# Patient Record
Sex: Female | Born: 1990 | Race: Black or African American | Hispanic: No | Marital: Single | Smoking: Former smoker
Health system: Southern US, Community
[De-identification: ages and names within clinical notes are randomized; demographics above are authoritative.]

## PROBLEM LIST (undated history)

## (undated) ENCOUNTER — Inpatient Hospital Stay (HOSPITAL_COMMUNITY): Payer: Self-pay

## (undated) ENCOUNTER — Emergency Department (HOSPITAL_COMMUNITY): Payer: Medicaid Other

## (undated) DIAGNOSIS — R519 Headache, unspecified: Secondary | ICD-10-CM

## (undated) DIAGNOSIS — R51 Headache: Secondary | ICD-10-CM

## (undated) DIAGNOSIS — D573 Sickle-cell trait: Secondary | ICD-10-CM

## (undated) DIAGNOSIS — O139 Gestational [pregnancy-induced] hypertension without significant proteinuria, unspecified trimester: Secondary | ICD-10-CM

## (undated) DIAGNOSIS — O159 Eclampsia, unspecified as to time period: Secondary | ICD-10-CM

## (undated) DIAGNOSIS — K219 Gastro-esophageal reflux disease without esophagitis: Secondary | ICD-10-CM

## (undated) DIAGNOSIS — I10 Essential (primary) hypertension: Secondary | ICD-10-CM

## (undated) DIAGNOSIS — D649 Anemia, unspecified: Secondary | ICD-10-CM

## (undated) HISTORY — DX: Essential (primary) hypertension: I10

## (undated) HISTORY — DX: Gestational (pregnancy-induced) hypertension without significant proteinuria, unspecified trimester: O13.9

## (undated) HISTORY — PX: CHOLECYSTECTOMY: SHX55

## (undated) HISTORY — DX: Gastro-esophageal reflux disease without esophagitis: K21.9

## (undated) HISTORY — DX: Anemia, unspecified: D64.9

## (undated) HISTORY — PX: APPENDECTOMY: SHX54

---
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
## 2008-02-21 ENCOUNTER — Emergency Department (HOSPITAL_COMMUNITY): Admission: EM | Admit: 2008-02-21 | Discharge: 2008-02-21 | Payer: Self-pay | Admitting: Emergency Medicine

## 2008-04-09 ENCOUNTER — Emergency Department (HOSPITAL_COMMUNITY): Admission: EM | Admit: 2008-04-09 | Discharge: 2008-04-09 | Payer: Self-pay | Admitting: Family Medicine

## 2008-04-28 HISTORY — PX: CHOLECYSTECTOMY: SHX55

## 2009-06-20 ENCOUNTER — Emergency Department (HOSPITAL_COMMUNITY): Admission: EM | Admit: 2009-06-20 | Discharge: 2009-06-20 | Payer: Self-pay | Admitting: Emergency Medicine

## 2009-06-27 ENCOUNTER — Inpatient Hospital Stay (HOSPITAL_COMMUNITY): Admission: EM | Admit: 2009-06-27 | Discharge: 2009-06-29 | Payer: Self-pay | Admitting: Emergency Medicine

## 2009-06-28 ENCOUNTER — Encounter (INDEPENDENT_AMBULATORY_CARE_PROVIDER_SITE_OTHER): Payer: Self-pay | Admitting: General Surgery

## 2009-11-17 ENCOUNTER — Ambulatory Visit: Payer: Self-pay | Admitting: Nurse Practitioner

## 2009-11-17 ENCOUNTER — Inpatient Hospital Stay (HOSPITAL_COMMUNITY): Admission: AD | Admit: 2009-11-17 | Discharge: 2009-11-17 | Payer: Self-pay | Admitting: Obstetrics & Gynecology

## 2010-02-20 ENCOUNTER — Emergency Department (HOSPITAL_COMMUNITY): Admission: EM | Admit: 2010-02-20 | Discharge: 2010-02-20 | Payer: Self-pay | Admitting: Emergency Medicine

## 2010-04-24 ENCOUNTER — Inpatient Hospital Stay (HOSPITAL_COMMUNITY)
Admission: AD | Admit: 2010-04-24 | Discharge: 2010-04-24 | Payer: Self-pay | Source: Home / Self Care | Attending: Obstetrics & Gynecology | Admitting: Obstetrics & Gynecology

## 2010-07-08 LAB — URINE CULTURE

## 2010-07-08 LAB — URINALYSIS, ROUTINE W REFLEX MICROSCOPIC
Bilirubin Urine: NEGATIVE
Glucose, UA: NEGATIVE mg/dL
Protein, ur: NEGATIVE mg/dL
Specific Gravity, Urine: 1.02 (ref 1.005–1.030)
pH: 6.5 (ref 5.0–8.0)

## 2010-07-08 LAB — URINE MICROSCOPIC-ADD ON

## 2010-07-08 LAB — CBC
HCT: 37.7 % (ref 36.0–46.0)
Platelets: 187 10*3/uL (ref 150–400)

## 2010-07-10 LAB — DIFFERENTIAL
Basophils Relative: 0 % (ref 0–1)
Eosinophils Absolute: 0 10*3/uL (ref 0.0–0.7)
Eosinophils Relative: 0 % (ref 0–5)
Lymphs Abs: 0.9 10*3/uL (ref 0.7–4.0)
Monocytes Absolute: 0.5 10*3/uL (ref 0.1–1.0)
Neutro Abs: 12 10*3/uL — ABNORMAL HIGH (ref 1.7–7.7)

## 2010-07-10 LAB — COMPREHENSIVE METABOLIC PANEL
Albumin: 4.2 g/dL (ref 3.5–5.2)
BUN: 8 mg/dL (ref 6–23)
Calcium: 9.8 mg/dL (ref 8.4–10.5)
GFR calc Af Amer: >60 mL/min (ref >60)
Glucose, Bld: 117 mg/dL — ABNORMAL HIGH (ref 70–99)
Sodium: 136 mEq/L (ref 135–145)

## 2010-07-10 LAB — URINALYSIS, ROUTINE W REFLEX MICROSCOPIC
Bilirubin Urine: NEGATIVE
Glucose, UA: NEGATIVE mg/dL
Hgb urine dipstick: NEGATIVE
Nitrite: NEGATIVE
Urobilinogen, UA: 0.2 mg/dL (ref 0.0–1.0)

## 2010-07-10 LAB — CBC
Hemoglobin: 13.7 g/dL (ref 12.0–15.0)
MCHC: 34 g/dL (ref 30.0–36.0)
Platelets: 195 10*3/uL (ref 150–400)
RBC: 5.17 MIL/uL — ABNORMAL HIGH (ref 3.87–5.11)
RDW: 12.4 % (ref 11.5–15.5)
WBC: 13.4 10*3/uL — ABNORMAL HIGH (ref 4.0–10.5)

## 2010-07-10 LAB — PREGNANCY, URINE: Preg Test, Ur: NEGATIVE

## 2010-07-10 LAB — LIPASE, BLOOD: Lipase: 23 U/L (ref 11–59)

## 2010-07-13 LAB — GC/CHLAMYDIA PROBE AMP, GENITAL: GC Probe Amp, Genital: NEGATIVE

## 2010-07-13 LAB — COMPREHENSIVE METABOLIC PANEL
BUN: 4 mg/dL — ABNORMAL LOW (ref 6–23)
CO2: 24 mEq/L (ref 19–32)
Calcium: 9.6 mg/dL (ref 8.4–10.5)
Chloride: 107 mEq/L (ref 96–112)
Creatinine, Ser: 0.61 mg/dL (ref 0.4–1.2)
GFR calc non Af Amer: >60 mL/min (ref >60)
Glucose, Bld: 95 mg/dL (ref 70–99)
Total Bilirubin: 0.2 mg/dL — ABNORMAL LOW (ref 0.3–1.2)

## 2010-07-13 LAB — WET PREP, GENITAL
Trich, Wet Prep: NONE SEEN
Yeast Wet Prep HPF POC: NONE SEEN

## 2010-07-13 LAB — CBC
HCT: 39 % (ref 36.0–46.0)
Hemoglobin: 13 g/dL (ref 12.0–15.0)
MCH: 26.5 pg (ref 26.0–34.0)
MCHC: 33.4 g/dL (ref 30.0–36.0)
MCV: 79.3 fL (ref 78.0–100.0)
RBC: 4.91 MIL/uL (ref 3.87–5.11)

## 2010-07-13 LAB — DIFFERENTIAL
Basophils Absolute: 0 10*3/uL (ref 0.0–0.1)
Eosinophils Absolute: 0.2 10*3/uL (ref 0.0–0.7)
Eosinophils Relative: 4 % (ref 0–5)
Lymphocytes Relative: 38 % (ref 12–46)
Lymphs Abs: 2.4 10*3/uL (ref 0.7–4.0)
Neutrophils Relative %: 52 % (ref 43–77)

## 2010-07-13 LAB — URINE MICROSCOPIC-ADD ON

## 2010-07-13 LAB — URINALYSIS, ROUTINE W REFLEX MICROSCOPIC
Glucose, UA: NEGATIVE mg/dL
Leukocytes, UA: NEGATIVE
Specific Gravity, Urine: 1.015 (ref 1.005–1.030)
Urobilinogen, UA: 1 mg/dL (ref 0.0–1.0)

## 2010-07-17 LAB — COMPREHENSIVE METABOLIC PANEL
CO2: 24 mEq/L (ref 19–32)
Calcium: 9.8 mg/dL (ref 8.4–10.5)
Creatinine, Ser: 0.69 mg/dL (ref 0.4–1.2)
GFR calc non Af Amer: >60 mL/min (ref >60)
Glucose, Bld: 119 mg/dL — ABNORMAL HIGH (ref 70–99)
Sodium: 141 mEq/L (ref 135–145)
Total Protein: 8.1 g/dL (ref 6.0–8.3)

## 2010-07-17 LAB — CBC
Hemoglobin: 13.7 g/dL (ref 12.0–15.0)
MCHC: 32.8 g/dL (ref 30.0–36.0)
MCV: 79.2 fL (ref 78.0–100.0)
RBC: 5.29 MIL/uL — ABNORMAL HIGH (ref 3.87–5.11)
RDW: 12.5 % (ref 11.5–15.5)

## 2010-07-17 LAB — DIFFERENTIAL
Lymphocytes Relative: 9 % — ABNORMAL LOW (ref 12–46)
Lymphs Abs: 1 10*3/uL (ref 0.7–4.0)
Monocytes Relative: 1 % — ABNORMAL LOW (ref 3–12)
Neutro Abs: 10.2 10*3/uL — ABNORMAL HIGH (ref 1.7–7.7)
Neutrophils Relative %: 90 % — ABNORMAL HIGH (ref 43–77)

## 2010-07-17 LAB — LIPASE, BLOOD: Lipase: 23 U/L (ref 11–59)

## 2010-07-21 LAB — URINE MICROSCOPIC-ADD ON

## 2010-07-21 LAB — URINALYSIS, ROUTINE W REFLEX MICROSCOPIC
Glucose, UA: NEGATIVE mg/dL
Ketones, ur: NEGATIVE mg/dL
Leukocytes, UA: NEGATIVE
Nitrite: POSITIVE — AB
Specific Gravity, Urine: 1.013 (ref 1.005–1.030)
pH: 7 (ref 5.0–8.0)

## 2010-07-21 LAB — CBC
Hemoglobin: 13.5 g/dL (ref 12.0–15.0)
MCHC: 34.3 g/dL (ref 30.0–36.0)
Platelets: 191 10*3/uL (ref 150–400)
RDW: 12.7 % (ref 11.5–15.5)

## 2010-07-21 LAB — DIFFERENTIAL
Lymphs Abs: 2.7 10*3/uL (ref 0.7–4.0)
Monocytes Absolute: 0.3 10*3/uL (ref 0.1–1.0)
Monocytes Relative: 6 % (ref 3–12)
Neutro Abs: 3 10*3/uL (ref 1.7–7.7)
Neutrophils Relative %: 48 % (ref 43–77)

## 2010-07-21 LAB — COMPREHENSIVE METABOLIC PANEL
ALT: 16 U/L (ref 0–35)
Albumin: 4.2 g/dL (ref 3.5–5.2)
BUN: 8 mg/dL (ref 6–23)
Calcium: 9.5 mg/dL (ref 8.4–10.5)
Glucose, Bld: 87 mg/dL (ref 70–99)
Sodium: 136 mEq/L (ref 135–145)
Total Protein: 7.7 g/dL (ref 6.0–8.3)

## 2010-07-21 LAB — GLUCOSE, CAPILLARY: Glucose-Capillary: 140 mg/dL — ABNORMAL HIGH (ref 70–99)

## 2010-07-23 ENCOUNTER — Emergency Department (HOSPITAL_COMMUNITY)
Admission: EM | Admit: 2010-07-23 | Discharge: 2010-07-23 | Disposition: A | Discharge status: Home / Self Care | Payer: Self-pay | Attending: Emergency Medicine | Admitting: Emergency Medicine

## 2010-07-23 DIAGNOSIS — J351 Hypertrophy of tonsils: Secondary | ICD-10-CM | POA: Insufficient documentation

## 2010-07-23 DIAGNOSIS — J029 Acute pharyngitis, unspecified: Secondary | ICD-10-CM | POA: Insufficient documentation

## 2010-07-23 LAB — RAPID STREP SCREEN (MED CTR MEBANE ONLY): Streptococcus, Group A Screen (Direct): NEGATIVE

## 2010-08-06 ENCOUNTER — Emergency Department (HOSPITAL_COMMUNITY)
Admission: EM | Admit: 2010-08-06 | Discharge: 2010-08-07 | Discharge status: Against Medical Advice | Payer: Self-pay | Attending: Emergency Medicine | Admitting: Emergency Medicine

## 2010-08-06 DIAGNOSIS — R5381 Other malaise: Secondary | ICD-10-CM | POA: Insufficient documentation

## 2010-08-06 DIAGNOSIS — J029 Acute pharyngitis, unspecified: Secondary | ICD-10-CM | POA: Insufficient documentation

## 2010-08-25 ENCOUNTER — Inpatient Hospital Stay (HOSPITAL_COMMUNITY)
Admission: AD | Admit: 2010-08-25 | Discharge: 2010-08-25 | Disposition: A | Discharge status: Home / Self Care | Payer: Medicaid Other | Source: Ambulatory Visit | Attending: Obstetrics and Gynecology | Admitting: Obstetrics and Gynecology

## 2010-08-25 ENCOUNTER — Inpatient Hospital Stay (HOSPITAL_COMMUNITY): Payer: Medicaid Other

## 2010-08-25 DIAGNOSIS — O99891 Other specified diseases and conditions complicating pregnancy: Secondary | ICD-10-CM | POA: Insufficient documentation

## 2010-08-25 DIAGNOSIS — O9989 Other specified diseases and conditions complicating pregnancy, childbirth and the puerperium: Secondary | ICD-10-CM

## 2010-08-25 DIAGNOSIS — R109 Unspecified abdominal pain: Secondary | ICD-10-CM

## 2010-08-25 LAB — URINALYSIS, ROUTINE W REFLEX MICROSCOPIC
Ketones, ur: NEGATIVE mg/dL
Nitrite: NEGATIVE
Protein, ur: NEGATIVE mg/dL
Urobilinogen, UA: 1 mg/dL (ref 0.0–1.0)

## 2010-08-25 LAB — CBC
HCT: 38.6 % (ref 36.0–46.0)
Hemoglobin: 13 g/dL (ref 12.0–15.0)
MCH: 25.8 pg — ABNORMAL LOW (ref 26.0–34.0)
MCV: 76.6 fL — ABNORMAL LOW (ref 78.0–100.0)
Platelets: 183 10*3/uL (ref 150–400)
RDW: 12.9 % (ref 11.5–15.5)

## 2010-08-25 LAB — HCG, QUANTITATIVE, PREGNANCY: hCG, Beta Chain, Quant, S: 200 m[IU]/mL — ABNORMAL HIGH (ref <5)

## 2010-08-25 LAB — POCT PREGNANCY, URINE: Preg Test, Ur: POSITIVE

## 2010-08-26 LAB — GC/CHLAMYDIA PROBE AMP, GENITAL
Chlamydia, DNA Probe: NEGATIVE
GC Probe Amp, Genital: NEGATIVE

## 2010-09-02 ENCOUNTER — Inpatient Hospital Stay (HOSPITAL_COMMUNITY): Payer: Medicaid Other

## 2010-09-02 ENCOUNTER — Inpatient Hospital Stay (HOSPITAL_COMMUNITY)
Admission: AD | Admit: 2010-09-02 | Discharge: 2010-09-02 | Disposition: A | Discharge status: Home / Self Care | Payer: Medicaid Other | Source: Ambulatory Visit | Attending: Obstetrics & Gynecology | Admitting: Obstetrics & Gynecology

## 2010-09-02 DIAGNOSIS — O99891 Other specified diseases and conditions complicating pregnancy: Secondary | ICD-10-CM | POA: Insufficient documentation

## 2010-09-02 DIAGNOSIS — R109 Unspecified abdominal pain: Secondary | ICD-10-CM | POA: Insufficient documentation

## 2010-09-02 LAB — HCG, QUANTITATIVE, PREGNANCY: hCG, Beta Chain, Quant, S: 5128 m[IU]/mL — ABNORMAL HIGH (ref <5)

## 2010-09-07 ENCOUNTER — Inpatient Hospital Stay (HOSPITAL_COMMUNITY)
Admission: AD | Admit: 2010-09-07 | Discharge: 2010-09-07 | Disposition: A | Discharge status: Home / Self Care | Payer: Medicaid Other | Source: Ambulatory Visit | Attending: Obstetrics & Gynecology | Admitting: Obstetrics & Gynecology

## 2010-09-07 ENCOUNTER — Inpatient Hospital Stay (HOSPITAL_COMMUNITY): Payer: Medicaid Other

## 2010-09-07 DIAGNOSIS — O209 Hemorrhage in early pregnancy, unspecified: Secondary | ICD-10-CM

## 2010-09-07 LAB — CBC
Hemoglobin: 12.8 g/dL (ref 12.0–15.0)
MCHC: 33.9 g/dL (ref 30.0–36.0)
Platelets: 172 10*3/uL (ref 150–400)
RDW: 13.3 % (ref 11.5–15.5)

## 2010-09-07 LAB — GLUCOSE, CAPILLARY: Glucose-Capillary: 101 mg/dL — ABNORMAL HIGH (ref 70–99)

## 2010-09-10 NOTE — Discharge Summary (Signed)
NAMECHEROLYN, BEHRLE               ACCOUNT NO.:  [id number]   MEDICAL RECORD NO.:  [id number]          PATIENT TYPE:  INP   LOCATION:  9156                          FACILITY:  [facility]   PHYSICIAN:  Phil D. Okey Dupre, M.D.     DATE OF BIRTH:  09-04-1990   DATE OF ADMISSION:  06/10/2007  DATE OF DISCHARGE:  06/12/2007                               DISCHARGE SUMMARY   ADMISSION DIAGNOSES:  1. Intrauterine pregnancy at 32 weeks, 5 days.  2. Pre-term contractions with positive fetal fibronectin.  3. Elevated blood pressures.   DISCHARGE DIAGNOSES:  1. Intrauterine pregnancy at 33 weeks, 0 days, gestation.  2. Threatened pre-term labor.  3. Pregnancy-induced hypertension.   PROCEDURES:  The patient had a ultrasound performed on the day of  admission, showing a single gestation, cephalic presentation.  Placenta  was posterior fundal above cervical os.  Amniotic fluid index was 15.1.  Estimated fetal weight was 1788 grams in the 35th percentile.  Cervical  length was 3.5 cm measured trans-vaginally.   CONSULTATIONS:  None.   COMPLICATIONS:  None.   PERTINENT LABORATORY FINDINGS:  Ms. Bernard had admission labs with a CBC  showing white blood cell count of 6.9, hemoglobin 11.5, hematocrit 33.6,  platelets 129.  Urinalysis was negative.  Complete metabolic panel:  Sodium 136, potassium 3.8, BUN 5, creatinine 0.57, AST 14, ALT 10.  Remainder of values within normal limits.  LDH was 107.  Uric acid was  7.3, fetal fibronectin was positive.  She had a 24-hour urine with total  volume of 1200, creatinine clearance of 118, 24-hour urine creatinine of  970, a 24-hour urine protein of 132.  Group B strep was negative.  She  had follow-up labs on hospital day #3 with CBC:  White blood cell count  was 10.5, hemoglobin 10.4, hematocrit 29.8, platelets 124.  Complete  metabolic panel:  Sodium 133, potassium 3.5, AST 16, ALT 10.  Remainder  of labs were within the normal range, including creatinine  0.67.   BRIEF PERTINENT ADMISSION HISTORY:  Ms. Hatton is an 20 year old gravida  1, para 0, presenting at 32 weeks, 5 days gestation, complaining of  cramping abdominal pain.  She was evaluated in the MAU and found to be  contracting every 5 to 10 minutes.  She was evaluated sterile speculum  exam, and fetal fibronectin was collected.  Her cervix was closed, 50%  effaced, -2 station.  Fetal fibronectin returned positive.  She also had  elevated blood pressures.  She was admitted for observation for possible  threatened pre-term labor and to rule out pregnancy-induced  hypertension.   HOSPITAL COURSE:  The patient was admitted and started on betamethasone.  She had labs drawn for pregnancy-induced hypertension, which showed low  platelets but were otherwise within normal range.  A 24-hour urine  collection was begun.  She was monitored with blood pressures, and they  got as high as about 150s/100s, at time of discharge here in the 130/80  range, and her blood pressures varied from 110 to 150 systolic/60s to  161W diastolic.  She was started on  Procardia as well, because she  continued to contract.  Dosage of Procardia was 10 mg every 6 hours.  On  hospital day #2, her cervix was re-evaluated and found to be closed -2  station.  Effacement could not be assessed.  Ultrasound was performed  showing a cervical length of 3.5 cm trans-vaginally.  The patient did  complete her course of steroids and her 24-hour urine showing a 24-hour  urine protein of 132.  Labs were repeated, and her platelets were  stable.  She denied any symptoms of pre-eclampsia at the time of  discharge, including headache, vision changes, right upper quadrant  epigastric pain.  She has denied contractions at the time of discharge.  The patient was in stable condition for discharge.   DISCHARGE STATUS:  Stable.   DISCHARGE MEDICATIONS:  1. Prenatal vitamins, one table daily.  2. Procardia 10 mg every 6 hours.    DISCHARGE INSTRUCTIONS:  1. Discharge to home.  2. Regular diet.  3. Activity is to be limited to getting up to eat, shower, and use the      bathroom.  Otherwise, she needs to be on bed rest.  4. The patient is to have home-bound teacher arranged for her      schooling.  A prescription has been provided to assist the patient      with arranging the home-bound teacher.  5. The patient is to follow up in high-risk clinic next week.  The      clinic has been called and a message left to set her up with an      appointment.  The patient has been provided with a number to call      on Monday morning, to confirm when that appointment is.      Karlton Lemon, MD  Electronically Signed     ______________________________  Javier Glazier. Okey Dupre, M.D.    NS/MEDQ  D:  06/12/2007  T:  06/14/2007  Job:  [id number]

## 2010-09-10 NOTE — Op Note (Signed)
Renee Suarez, Renee Suarez               ACCOUNT NO.:  [id number]   MEDICAL RECORD NO.:  [id number]          PATIENT TYPE:  INP   LOCATION:  9372                          [facility]ACILITY:  [facility]   PHYSICIAN:  Lazaro Arms, M.D.   DATE OF BIRTH:  July 07, 1990   DATE OF PROCEDURE:  06/17/2007  DATE OF DISCHARGE:                               OPERATIVE REPORT   PREOP DIAGNOSES:  1. Intrauterine pregnancy at [redacted] weeks gestation.  2. Eclampsia.   POSTOP DIAGNOSES:  1. Intrauterine pregnancy at [redacted] weeks gestation.  2. Eclampsia.   PROCEDURE:  Primary low transverse cesarean section.   SURGEON:  Duane Lope, MD.   ANESTHESIA:  Spinal.   FINDINGS:  I was called to the patient's room emergently at  approximately 0355 hours because patient had experienced an eclamptic  seizure.  She had been in the hospital for evaluation of preeclampsia  and had a normal 24-hour urine completed on the 13th and another was in  progress.  All of her labs were otherwise normal.  Her blood pressures  were running in the 135 to 150s/90 to 100 range.  She woke up about an  hour prior to this seizure, was complaining she just did not feel good  and then had a seizure.  There were no problems with that, it lasted  about 45 seconds.  A fetal heart rate was obtained just after and was in  the 140s and reassuring.  The patient had a pretty short postictal  stage, I could talk to her without difficulty within about 10 minutes of  completing the seizure.  Of course, I started her on magnesium sulfate  right away, put a Foley in and made preparations to proceed with a  cesarean section.   At the time of cesarean section we delivered a viable female infant at  0601 hours, with Apgars of 8 and 9, with the weight to be determined in  the Nursery.  There was a three-vessel cord.  Cord blood and cord gas  was sent.  The cord gas had a pH of 7.26.  The placenta interestingly  did have some calcifications already.  Uterus, tubes and  ovaries were  otherwise normal.   DESCRIPTION OF OPERATION:  The patient was taken to the operating room,  underwent a spinal anesthetic.  I redrew her labs right after her  seizure and checked her platelet count which still was 127, so Dr.  Tacy Dura and I both decided it was appropriate to go forward with the  spinal at that point, she was cooperative and conversive without any  problems.  She was prepped and draped in the usual sterile fashion.  A  Pfannenstiel skin incision was made, carried down sharply to the rectus  fascia, scored in the midline, extended laterally.  The fascia was taken  off of the  muscles superiorly and inferiorly without difficulty.  The  muscles were divided, peritoneal cavity was entered.  A bladder blade  was placed.  A vesicouterine serosal flap was created.  A low transverse  hysterotomy incision was made.  Over this incision  was delivered a  viable female infant at 51 with Apgars of 8 and 9, weight to be  determined in the Nursery, three-vessel cord, cord blood and cord gas  were sent.  The infant was handed to the neonatologist who was in  attendance for routine neonatal resuscitation.  Placenta was delivered  spontaneously and did have calcifications, was sent to Pathology for  evaluation.  The uterus was wiped clean, an Alexis self-retaining wound  retractor was placed.  The uterus was closed in two layers, the first  being a running interlocking layer, the second being an imbricating  layer.  There was good hemostasis.  The pelvis was irrigated vigorously.  The wound retractor was removed.  Muscles of peritoneum reapproximated  loosely.  The fascia closed using #0 Vicryl running subcutaneous  tissues, made hemostatic and irrigated.  Skin was closed using skin  staples.  The patient tolerated the procedure well.  She experienced 600  mL of blood loss.  Taken to the recovery room in good stable condition.  All counts were correct x3.      Lazaro Arms, M.D.  Electronically Signed     LHE/MEDQ  D:  06/17/2007  T:  06/17/2007  Job:  [id number]

## 2010-09-10 NOTE — Discharge Summary (Signed)
NAMEMIHO, MONDA               ACCOUNT NO.:  [id number]   MEDICAL RECORD NO.:  [id number]          PATIENT TYPE:  INP   LOCATION:                                [facility]   PHYSICIAN:  Tilda Burrow, M.D. DATE OF BIRTH:  July 16, 1990   DATE OF ADMISSION:  06/14/2007  DATE OF DISCHARGE:  06/20/2007                               DISCHARGE SUMMARY   ADMISSION DIAGNOSIS:  Pregnancy at 33 weeks and 3 days gestations,  headaches.   HISTORY OF PRESENT ILLNESS:  This 20 year old female presented with a  chief complaint of headache, seen in the MAU at 33 weeks, 3 days where  she was found to have blood pressure of 143/96, repeated at 153/99.  Prenatal course had been followed through [facility]  with blood type A+, antibody screen negative, platelets 247,000.  Hepatitis, HIV, RPR, GC and chlamydia are all negative.  She is admitted  with a BUN of 9 and creatinine 0.61, hemoglobin 12, hematocrit 36,  platelet count 128,000.  She  was admitted for assessment of status with  24 hour urine being collected.  Urinalysis showed greater than 300 mg  protein.  Liver function tests were normal.  Platelets 128,000 as  mentioned earlier.  At 4 a.m. on February 19, 2009_ , the patient had  what appeared to be a generalized tonic-clonic seizure with blood  pressures remained 150/90.  Fetal heart rate remained stable in the 190.  Magnesium sulfate was initiated and the patient was taken for primary  low transverse cervical cesarean section due to eclampsia remote from  delivery.  She was therefore placed in the PICU for management of  magnesium sulfate postprocedure.  Hospital PICU care included blood  pressures as 106-145/60-100 diastolic.  After 24 hours the magnesium  sulfate was discontinued.  She was placed on Toprol XL 50 mg daily.  She  had an uneventful course after blood pressures returned to normal.   On postop day 3 she was breastfeeding, pumping well with blood  pressures  80-93 diastolic. Birth control plans include plans for Mirena in the  future.  The incision was healing well without erythema and she was  discharged home as mentioned on Toprol XL 50 mg p.o. daily x2 weeks.  For followup staple removal in 5 days and 2 weeks for reassessment of  blood pressures in the postpartum clinic.      Tilda Burrow, M.D.  Electronically Signed     JVF/MEDQ  D:  06/20/2007  T:  06/21/2007  Job:  [id number]

## 2010-09-30 ENCOUNTER — Inpatient Hospital Stay (HOSPITAL_COMMUNITY): Payer: Medicaid Other

## 2010-09-30 ENCOUNTER — Inpatient Hospital Stay (HOSPITAL_COMMUNITY)
Admission: AD | Admit: 2010-09-30 | Discharge: 2010-09-30 | Disposition: A | Discharge status: Home / Self Care | Payer: Medicaid Other | Source: Ambulatory Visit | Attending: Obstetrics & Gynecology | Admitting: Obstetrics & Gynecology

## 2010-09-30 DIAGNOSIS — N390 Urinary tract infection, site not specified: Secondary | ICD-10-CM | POA: Insufficient documentation

## 2010-09-30 DIAGNOSIS — B3731 Acute candidiasis of vulva and vagina: Secondary | ICD-10-CM | POA: Insufficient documentation

## 2010-09-30 DIAGNOSIS — O2390 Unspecified genitourinary tract infection in pregnancy, unspecified trimester: Secondary | ICD-10-CM | POA: Insufficient documentation

## 2010-09-30 DIAGNOSIS — R109 Unspecified abdominal pain: Secondary | ICD-10-CM | POA: Insufficient documentation

## 2010-09-30 DIAGNOSIS — B373 Candidiasis of vulva and vagina: Secondary | ICD-10-CM | POA: Insufficient documentation

## 2010-09-30 LAB — WET PREP, GENITAL
Clue Cells Wet Prep HPF POC: NONE SEEN
Trich, Wet Prep: NONE SEEN

## 2010-09-30 LAB — CBC
MCH: 26.1 pg (ref 26.0–34.0)
Platelets: 171 10*3/uL (ref 150–400)
RBC: 4.56 MIL/uL (ref 3.87–5.11)
RDW: 13 % (ref 11.5–15.5)
WBC: 10.8 10*3/uL — ABNORMAL HIGH (ref 4.0–10.5)

## 2010-09-30 LAB — URINALYSIS, ROUTINE W REFLEX MICROSCOPIC
Bilirubin Urine: NEGATIVE
Glucose, UA: NEGATIVE mg/dL
Hgb urine dipstick: NEGATIVE
Ketones, ur: NEGATIVE mg/dL
Protein, ur: NEGATIVE mg/dL
Urobilinogen, UA: 0.2 mg/dL (ref 0.0–1.0)

## 2010-09-30 LAB — HCG, QUANTITATIVE, PREGNANCY: hCG, Beta Chain, Quant, S: 104133 m[IU]/mL — ABNORMAL HIGH (ref <5)

## 2010-10-02 LAB — URINE CULTURE: Culture  Setup Time: 201206050204

## 2010-10-09 ENCOUNTER — Other Ambulatory Visit: Payer: Self-pay | Admitting: Family Medicine

## 2010-10-09 DIAGNOSIS — Z139 Encounter for screening, unspecified: Secondary | ICD-10-CM

## 2010-10-09 LAB — HEMATOCRIT: HCT: 36 %

## 2010-10-09 LAB — HEMOGLOBIN: Hemoglobin: 12 g/dL (ref 12.0–16.0)

## 2010-10-22 ENCOUNTER — Ambulatory Visit (HOSPITAL_COMMUNITY)
Admission: RE | Admit: 2010-10-22 | Discharge: 2010-10-22 | Disposition: A | Discharge status: Home / Self Care | Payer: Medicaid Other | Source: Ambulatory Visit | Attending: Family Medicine | Admitting: Family Medicine

## 2010-10-22 DIAGNOSIS — O3510X0 Maternal care for (suspected) chromosomal abnormality in fetus, unspecified, not applicable or unspecified: Secondary | ICD-10-CM | POA: Insufficient documentation

## 2010-10-22 DIAGNOSIS — Z139 Encounter for screening, unspecified: Secondary | ICD-10-CM

## 2010-10-22 DIAGNOSIS — O351XX0 Maternal care for (suspected) chromosomal abnormality in fetus, not applicable or unspecified: Secondary | ICD-10-CM | POA: Insufficient documentation

## 2010-10-22 DIAGNOSIS — Z3689 Encounter for other specified antenatal screening: Secondary | ICD-10-CM | POA: Insufficient documentation

## 2010-11-07 ENCOUNTER — Encounter: Payer: Medicaid Other | Admitting: Family Medicine

## 2010-11-12 ENCOUNTER — Ambulatory Visit (HOSPITAL_COMMUNITY)
Admission: RE | Admit: 2010-11-12 | Discharge: 2010-11-12 | Disposition: A | Discharge status: Home / Self Care | Payer: Medicaid Other | Source: Ambulatory Visit | Attending: Family Medicine | Admitting: Family Medicine

## 2010-11-12 DIAGNOSIS — O351XX0 Maternal care for (suspected) chromosomal abnormality in fetus, not applicable or unspecified: Secondary | ICD-10-CM | POA: Insufficient documentation

## 2010-11-12 DIAGNOSIS — O3510X0 Maternal care for (suspected) chromosomal abnormality in fetus, unspecified, not applicable or unspecified: Secondary | ICD-10-CM | POA: Insufficient documentation

## 2010-11-13 ENCOUNTER — Other Ambulatory Visit: Payer: Self-pay | Admitting: Maternal and Fetal Medicine

## 2010-11-14 ENCOUNTER — Ambulatory Visit: Payer: Medicaid Other | Admitting: Physician Assistant

## 2010-11-14 DIAGNOSIS — O09299 Supervision of pregnancy with other poor reproductive or obstetric history, unspecified trimester: Secondary | ICD-10-CM

## 2010-11-14 DIAGNOSIS — B3731 Acute candidiasis of vulva and vagina: Secondary | ICD-10-CM

## 2010-11-14 DIAGNOSIS — G43109 Migraine with aura, not intractable, without status migrainosus: Secondary | ICD-10-CM

## 2010-11-14 DIAGNOSIS — O0990 Supervision of high risk pregnancy, unspecified, unspecified trimester: Secondary | ICD-10-CM

## 2010-11-14 DIAGNOSIS — B373 Candidiasis of vulva and vagina: Secondary | ICD-10-CM

## 2010-11-14 LAB — POCT URINALYSIS DIP (DEVICE)
Hgb urine dipstick: NEGATIVE
Ketones, ur: NEGATIVE mg/dL
Leukocytes, UA: NEGATIVE
Protein, ur: NEGATIVE mg/dL
pH: 7 (ref 5.0–8.0)

## 2010-11-14 LAB — RPR: RPR Screen: NEGATIVE

## 2010-11-14 LAB — GLUCOSE TOLERANCE, 1 HOUR: Glucose, 1 Hour GTT: 75

## 2010-11-14 LAB — ABO AND RH: ABO/RH(D): A POS

## 2010-11-14 LAB — HGB ELECTROPHORESIS: Hgb electrophoresis, blood: NONREACTIVE

## 2010-11-14 LAB — PLATELET COUNT: Platelets: 193

## 2010-11-14 MED ORDER — CYCLOBENZAPRINE HCL 10 MG PO TABS: 10.0000 mg | ORAL_TABLET | Freq: Three times a day (TID) | ORAL | Status: AC | PRN

## 2010-11-14 NOTE — Patient Instructions (Signed)
Pregnancy - Second Trimester The second trimester is the period between 13 to 27 weeks of your pregnancy. It is important to follow your doctor's instructions. HOME CARE  Do not smoke.   Do not drink alcohol or use drugs.   Only take medicine the doctor tells you to take.   Take prenatal vitamins as directed. The vitamin should contain 1 milligram of folic acid.   Exercise.   Eat healthy foods. Eat regular, well-balanced meals.   You can have sex (intercourse) if there are no other problems with the pregnancy.   Do not use hot tubs, steam rooms, or saunas.   Wear a seat belt while driving.   Avoid raw meat, uncooked cheese, and litter boxes and soil used by cats.   Visit your dentist. Shirlee Limerick are okay.  GET HELP IF:  You have any concerns or worries during your pregnancy.  GET HELP RIGHT AWAY IF:  You have a temperature by mouth above 100.5, not controlled by medicine.   Fluid is coming from the vagina.   Blood is coming from the vagina. Light spotting is common, especially after sex (intercourse).   You have a bad smelling fluid (discharge) coming from the vagina. The fluid changes from clear to white.   You still feel sick to your stomach (nauseous).   You throw up (vomit) blood.   You loose or gain more than 2 pounds (0.9 kilograms) of weight over a weeks time, or as suggested by your doctor.   Your face, hands, feet, or legs get puffy (swell).   You get exposed to [place] measles and have never had them.   You get exposed to fifth disease or chicken pox.   You have belly (abdominal) pain.   You have a bad headache that will not go away.   You have watery poop (diarrhea), pain when you pee (urinate), or have shortness of breath.   You start to have problems seeing (blurry or double vision).   You fall, are in a car accident, or have any kind of trauma.   There is mental or physical violence at home.  MAKE SURE YOU:   Understand these instructions.     Will watch your condition.   Will get help right away if you are not doing well or get worse.  Document Released: 07/09/2009  [facility] Patient Information 2011 [place], [place].Migraine Headache A migraine headache is an intense, throbbing pain on one or both sides of your head. The exact cause of a migraine headache is not always known. A migraine may be caused when nerves in the brain become irritated and release chemicals that cause swelling (inflammation) within blood vessels, causing pain. Many migraine sufferers have a family history of migraines. Before you get a migraine you may or may not get an aura. An aura is a group of symptoms that can predict the beginning of a migraine. An aura may include:  Visual changes such as:   Flashing lights.   Seeing bright spots or zig-zag lines.   Tunnel vision.   Feelings of numbness.   Trouble talking.   Muscle weakness.  SYMPTOMS OF A MIGRAINE  A migraine headache has one or more of the following symptoms:  Pain on one or both sides of your head.   Pain that is pulsating or throbbing in nature.   Pain that is severe enough to prevent daily activities.   Pain that is aggravated by any daily physical activity.   Nausea (feeling sick to  your stomach), vomiting or both.   Pain with exposure to bright lights, loud noises or activity.   General sensitivity to bright lights or loud noises.  MIGRAINE TRIGGERS A migraine headache can be triggered by many things. Examples of triggers include:   Alcohol.   Smoking.   Stress.   It may be related to menses (female menstruation).   Aged cheeses.   Foods or drinks that contain nitrates, glutamate, aspartame or tyramine.   Lack of sleep.   Chocolate.   Caffeine.   Hunger.   Medications such as nitroglycerine (used to treat chest pain), birth control pills, estrogen and some blood pressure medications.  DIAGNOSIS  A migraine headache is often diagnosed based on:   Your  symptoms.   Physical examination.   A CT scan of your head may be ordered to see if your headaches are caused from other medical conditions.  HOME CARE INSTRUCTIONS  Medications can help prevent migraines if they are recurrent or should they become recurrent. Your caregiver can help you with a medication or treatment program that will be helpful to you.   If you get a migraine, it may be helpful to lie down in a dark, quiet room.   It may be helpful to keep a headache diary. This may help you find a trend as to what may be triggering your headaches.  SEEK IMMEDIATE MEDICAL CARE IF:   You do not get relief from the medications given to you or you have a recurrence of pain.   You have confusion, personality changes or seizures.   You have headaches that wake you from sleep.   You have an increased frequency in your headaches.   You have a stiff neck.   You have a loss of vision.   You have muscle weakness.   You start losing your balance or have trouble walking.   You feel faint or pass out.  MAKE SURE YOU:   Understand these instructions.   Will watch your condition.   Will get help right away if you are not doing well or get worse.  Document Released: 04/14/2005 Document Re-Released: 02/09/2009 [facility] Patient Information 2011 [place], [place].

## 2010-11-14 NOTE — Progress Notes (Signed)
   Subjective:    Renee Suarez is a 20 y.o. female being seen today for her obstetrical visit. She is at [redacted]w[redacted]d gestation. Patient reports headache. Fetal movement: flutters.  Menstrual History: OB History    Grav Para Term Preterm Abortions TAB SAB Ect Mult Living   2 1  1  0 0 0 0 0 1         The following portions of the patient's history were reviewed and updated as appropriate: allergies, current medications, past family history, past medical history, past social history, past surgical history and problem list.  Review of Systems A comprehensive review of systems was negative except for: Neurological: positive for headaches   Objective:     BP 100/69  Temp 98.1 F (36.7 C)  Ht 5\' 1"  (1.549 m)  Wt 190 lb 14.4 oz (86.592 kg)  BMI 36.07 kg/m2 Uterine Size: 16 cm and size equals dates  Pelvic Exam: Not performed today, previously done at [facility]                                         Assessment:    Pregnancy 15 and 6/7 weeks   Plan:    Problem list reviewed and updated. Labs reviewed. AFP3 discussed: Discussed. Role of ultrasound in pregnancy discussed; fetal survey: ordered. Amniocentesis discussed: not indicated. Follow up in 3 weeks. [name]

## 2010-11-14 NOTE — Progress Notes (Signed)
Swelling-feet/hands, headaches qd x 1 week, pressure on left side, white "cottage cheese" discharged with itching

## 2010-11-15 ENCOUNTER — Telehealth: Payer: Self-pay | Admitting: *Deleted

## 2010-11-15 MED ORDER — TERCONAZOLE 0.4 % VA CREA: 1.0000 | TOPICAL_CREAM | Freq: Every day | VAGINAL | Status: AC

## 2010-11-15 NOTE — Telephone Encounter (Signed)
Pt states she was supposed to be prescribed 2 additional meds yesterday- 1 for BP to assist w/migraines and another for vag yeast infection. She states she had already taken diflucan w/poor results. She understood she would be given med to be taken twice daily x3 days. I stated that any BP med would likely drop her BP too low and she will need to discuss w/provider further @ next visit. Rx received from Wynelle Bourgeois CNM for c/o yeast. Pt voiced understanding.

## 2010-11-18 ENCOUNTER — Ambulatory Visit (INDEPENDENT_AMBULATORY_CARE_PROVIDER_SITE_OTHER): Payer: Medicaid Other | Admitting: Obstetrics and Gynecology

## 2010-11-18 VITALS — BP 110/71 | Temp 97.6°F | Wt 189.7 lb

## 2010-11-18 DIAGNOSIS — O0990 Supervision of high risk pregnancy, unspecified, unspecified trimester: Secondary | ICD-10-CM

## 2010-11-18 LAB — COMPREHENSIVE METABOLIC PANEL
ALT: 14 U/L (ref 0–35)
AST: 16 U/L (ref 0–37)
Albumin: 3.7 g/dL (ref 3.5–5.2)
Alkaline Phosphatase: 34 U/L — ABNORMAL LOW (ref 39–117)
BUN: 6 mg/dL (ref 6–23)
CO2: 20 mEq/L (ref 19–32)
Calcium: 9.3 mg/dL (ref 8.4–10.5)
Chloride: 105 mEq/L (ref 96–112)
Creat: 0.54 mg/dL (ref 0.50–1.10)
Glucose, Bld: 80 mg/dL (ref 70–99)
Potassium: 3.8 mEq/L (ref 3.5–5.3)
Sodium: 136 mEq/L (ref 135–145)
Total Bilirubin: 0.4 mg/dL (ref 0.3–1.2)
Total Protein: 6.5 g/dL (ref 6.0–8.3)

## 2010-11-18 LAB — CBC
HCT: 33.6 % — ABNORMAL LOW (ref 36.0–46.0)
Hemoglobin: 11.4 g/dL — ABNORMAL LOW (ref 12.0–15.0)
MCH: 26 pg (ref 26.0–34.0)
MCHC: 33.9 g/dL (ref 30.0–36.0)
MCV: 76.7 fL — ABNORMAL LOW (ref 78.0–100.0)
Platelets: 159 10*3/uL (ref 150–400)
RBC: 4.38 MIL/uL (ref 3.87–5.11)
RDW: 13.9 % (ref 11.5–15.5)
WBC: 7.5 10*3/uL (ref 4.0–10.5)

## 2010-11-18 NOTE — Progress Notes (Signed)
Addended by: Sherre Lain A on: 11/18/2010 11:45 AM   Modules accepted: Orders

## 2010-11-18 NOTE — Progress Notes (Signed)
Addended by: Catalina Antigua on: 11/18/2010 11:32 AM   Modules accepted: Orders

## 2010-11-18 NOTE — Progress Notes (Signed)
  Sno complaintsubjective:    Renee Suarez is a 20 y.o. [phone] [redacted]w[redacted]d being seen today for her obstetrical visit.  Patient reports no complaints. Fetal movement: normal.  Objective:    BP 110/71  Temp 97.6 F (36.4 C)  Wt 189 lb 11.2 oz (86.047 kg)  Physical Exam  Exam  FHT:  140 BPM  Uterine Size: na  Presentation: unsure     Assessment:    Pregnancy:  G2P0101 at [redacted]w[redacted]d without complaints.     Plan:    There is no problem list on file for this patient. - Follow-up baseline labs. Patient returned 24hr urine today (total volume ) - Follow-up anatomy ultrasound on 12/05/2010 - Follow-up first trimester screen results   Follow up in 2 Weeks.

## 2010-11-18 NOTE — Progress Notes (Signed)
Pt returned 24 hour urine total volume 350 ml.  Having some swelling in feet, and pain on her left side. No vaginal discharge.

## 2010-11-19 LAB — CREATININE CLEARANCE, URINE, 24 HOUR
Creatinine Clearance: 60 mL/min — ABNORMAL LOW (ref 75–115)
Creatinine, 24H Ur: 464 mg/d — ABNORMAL LOW (ref 700–1800)
Creatinine, Urine: 132.7 mg/dL

## 2010-12-05 ENCOUNTER — Ambulatory Visit (HOSPITAL_COMMUNITY)
Admission: RE | Admit: 2010-12-05 | Discharge: 2010-12-05 | Disposition: A | Discharge status: Home / Self Care | Payer: Medicaid Other | Source: Ambulatory Visit | Attending: Physician Assistant | Admitting: Physician Assistant

## 2010-12-05 DIAGNOSIS — O09299 Supervision of pregnancy with other poor reproductive or obstetric history, unspecified trimester: Secondary | ICD-10-CM | POA: Insufficient documentation

## 2010-12-05 DIAGNOSIS — Z363 Encounter for antenatal screening for malformations: Secondary | ICD-10-CM | POA: Insufficient documentation

## 2010-12-05 DIAGNOSIS — O358XX0 Maternal care for other (suspected) fetal abnormality and damage, not applicable or unspecified: Secondary | ICD-10-CM | POA: Insufficient documentation

## 2010-12-05 DIAGNOSIS — Z1389 Encounter for screening for other disorder: Secondary | ICD-10-CM | POA: Insufficient documentation

## 2010-12-05 DIAGNOSIS — Z8751 Personal history of pre-term labor: Secondary | ICD-10-CM | POA: Insufficient documentation

## 2010-12-23 ENCOUNTER — Ambulatory Visit (INDEPENDENT_AMBULATORY_CARE_PROVIDER_SITE_OTHER): Payer: Medicaid Other | Admitting: Family Medicine

## 2010-12-23 DIAGNOSIS — O169 Unspecified maternal hypertension, unspecified trimester: Secondary | ICD-10-CM

## 2010-12-23 LAB — POCT URINALYSIS DIP (DEVICE)
Bilirubin Urine: NEGATIVE
Glucose, UA: NEGATIVE mg/dL
Hgb urine dipstick: NEGATIVE
Ketones, ur: NEGATIVE mg/dL
Nitrite: NEGATIVE
Protein, ur: NEGATIVE mg/dL
Specific Gravity, Urine: 1.015 (ref 1.005–1.030)
Urobilinogen, UA: 1 mg/dL (ref 0.0–1.0)
pH: 7 (ref 5.0–8.0)

## 2010-12-23 NOTE — Progress Notes (Signed)
Round ligament pain.  No other concerns.  Good FA, no vag bleeding, d/c.  Denies HA, RUQ pain.  + Dependent 1+ edema.  RTC 2 weeks.

## 2010-12-23 NOTE — Progress Notes (Signed)
Edema on feet. Pain: LLQ of abd Pressure: pelvis

## 2011-01-02 ENCOUNTER — Encounter: Payer: Self-pay | Admitting: Family Medicine

## 2011-01-04 ENCOUNTER — Encounter (HOSPITAL_COMMUNITY): Payer: Self-pay | Admitting: Obstetrics and Gynecology

## 2011-01-04 ENCOUNTER — Inpatient Hospital Stay (HOSPITAL_COMMUNITY)
Admission: AD | Admit: 2011-01-04 | Discharge: 2011-01-04 | Disposition: A | Discharge status: Home / Self Care | Payer: Medicaid Other | Source: Ambulatory Visit | Attending: Obstetrics & Gynecology | Admitting: Obstetrics & Gynecology

## 2011-01-04 DIAGNOSIS — R51 Headache: Secondary | ICD-10-CM

## 2011-01-04 DIAGNOSIS — O99891 Other specified diseases and conditions complicating pregnancy: Secondary | ICD-10-CM | POA: Insufficient documentation

## 2011-01-04 DIAGNOSIS — K219 Gastro-esophageal reflux disease without esophagitis: Secondary | ICD-10-CM | POA: Insufficient documentation

## 2011-01-04 NOTE — Progress Notes (Signed)
Headache since last night concerned about blood pressure has history of preeclampsia with previous pregnancy.

## 2011-01-04 NOTE — ED Provider Notes (Signed)
History     Chief Complaint  Patient presents with  . Headache   HPI C/O headache x 1 day. No vision changes, n/v, abdominal pain. Concerned d/t history of eclampsia at 33 weeks in last pregnancy. Took tylenol just before coming to MAU. Headache has now resolved.   OB History    Grav Para Term Preterm Abortions TAB SAB Ect Mult Living   2 1 0 1 0 0 0 0 0 1       Past Medical History  Diagnosis Date  . Anemia   . Preterm labor   . GERD (gastroesophageal reflux disease)   . Seizures     eclampsia  . Hypertension   . Pregnancy induced hypertension     Past Surgical History  Procedure Date  . Cesarean section   . Cholecystectomy     Family History  Problem Relation Age of Onset  . Mental illness Brother   . Mental retardation Brother   . Hypertension Paternal Grandmother   . Diabetes Paternal Grandmother     History  Substance Use Topics  . Smoking status: Never Smoker   . Smokeless tobacco: Not on file  . Alcohol Use: No    Allergies: No Known Allergies  Prescriptions prior to admission  Medication Sig Dispense Refill  . acetaminophen (TYLENOL) 500 MG tablet Take 1,000 mg by mouth every 6 (six) hours as needed. Patient took medication for a headache.       . Prenatal Vit-Fe Psac Cmplx-FA (PRENATAL MULTIVITAMIN) 60-1 MG tablet Take 1 tablet by mouth daily.          Review of Systems  Constitutional: Negative.   Eyes: Negative for blurred vision, double vision and photophobia.  Respiratory: Negative.   Cardiovascular: Negative.   Gastrointestinal: Negative for nausea, vomiting, abdominal pain, diarrhea and constipation.  Genitourinary: Negative for dysuria, urgency, frequency, hematuria and flank pain.       Negative for vaginal bleeding, cramping/contractions  Musculoskeletal: Negative.   Neurological: Positive for headaches.  Psychiatric/Behavioral: Negative.    Physical Exam   Blood pressure 103/60, pulse 81, temperature 98.4 F (36.9 C),  temperature source Oral, resp. rate 20, height 5\' 1"  (1.549 m), weight 87.726 kg (193 lb 6.4 oz).  Physical Exam  Constitutional: She is oriented to person, place, and time. She appears well-developed and well-nourished. No distress.  HENT:  Head: Normocephalic and atraumatic.  Cardiovascular: Normal rate.   Respiratory: Effort normal.  GI: Soft. There is no tenderness.  Musculoskeletal: Normal range of motion.  Neurological: She is alert and oriented to person, place, and time. No cranial nerve deficit.  Skin: Skin is warm and dry.  Psychiatric: She has a normal mood and affect. Her behavior is normal.    MAU Course  Procedures   Assessment and Plan  19 y.o. G2P0101 at [redacted]w[redacted]d Headache, resolved with tylenol Rev'd precautions, f/u as scheduled for prenatal care  Jowell Bossi 01/04/2011, 2:19 PM

## 2011-01-04 NOTE — Progress Notes (Signed)
Pt presents to MAU with complaints of HA that started yesterday 01/04/11.. Pt states she took extra strength tylenol and the pain is going away.

## 2011-01-16 LAB — URINALYSIS, ROUTINE W REFLEX MICROSCOPIC
Bilirubin Urine: NEGATIVE
Glucose, UA: NEGATIVE
Hgb urine dipstick: NEGATIVE
Ketones, ur: NEGATIVE
Ketones, ur: 15 — AB
Nitrite: NEGATIVE
Nitrite: NEGATIVE
Specific Gravity, Urine: 1.01
pH: 7
pH: 6.5

## 2011-01-16 LAB — WET PREP, GENITAL
Clue Cells Wet Prep HPF POC: NONE SEEN
Yeast Wet Prep HPF POC: NONE SEEN

## 2011-01-17 LAB — CROSSMATCH: Antibody Screen: NEGATIVE

## 2011-01-17 LAB — COMPREHENSIVE METABOLIC PANEL
ALT: 10
ALT: 10
ALT: 14
AST: 18
AST: 22
Albumin: 2.2 — ABNORMAL LOW
Albumin: 2.8 — ABNORMAL LOW
Alkaline Phosphatase: 92
Alkaline Phosphatase: 87
Alkaline Phosphatase: 89
BUN: 5 — ABNORMAL LOW
BUN: 9
BUN: 9
CO2: 22
CO2: 23
CO2: 29
Calcium: 8.8
Chloride: 106
Chloride: 100
Chloride: 100
Creatinine, Ser: 0.61
GFR calc Af Amer: >60
GFR calc Af Amer: >60
GFR calc non Af Amer: >60
Glucose, Bld: 80
Glucose, Bld: 105 — ABNORMAL HIGH
Glucose, Bld: 88
Potassium: 3.8
Potassium: 3.5
Potassium: 3.8
Sodium: 136
Sodium: 134 — ABNORMAL LOW
Sodium: 135
Total Bilirubin: 0.7
Total Bilirubin: 0.5
Total Bilirubin: 0.7
Total Protein: 5.8 — ABNORMAL LOW
Total Protein: 5.3 — ABNORMAL LOW
Total Protein: 5.3 — ABNORMAL LOW
Total Protein: 5.6 — ABNORMAL LOW

## 2011-01-17 LAB — POCT URINALYSIS DIP (DEVICE)
Glucose, UA: NEGATIVE
Nitrite: NEGATIVE
Operator id: 120861
Urobilinogen, UA: 0.2

## 2011-01-17 LAB — LACTATE DEHYDROGENASE
LDH: 107
LDH: 221

## 2011-01-17 LAB — CREATININE CLEARANCE, URINE, 24 HOUR
Collection Interval-CRCL: 24
Collection Interval-CRCL: 24
Creatinine Clearance: 118 — ABNORMAL HIGH
Creatinine, 24H Ur: 1118
Creatinine, Urine: 80.8
Creatinine: 0.57
Urine Total Volume-CRCL: 2300

## 2011-01-17 LAB — CBC
HCT: 33.6 — ABNORMAL LOW
HCT: 29.8 — ABNORMAL LOW
HCT: 36.5
Hemoglobin: 11.5 — ABNORMAL LOW
Hemoglobin: 10.4 — ABNORMAL LOW
Hemoglobin: 10.5 — ABNORMAL LOW
Hemoglobin: 11 — ABNORMAL LOW
MCHC: 34.3
MCHC: 34.6
MCV: 77.6 — ABNORMAL LOW
MCV: 77.9 — ABNORMAL LOW
Platelets: 128 — ABNORMAL LOW
RBC: 4.3
RBC: 3.9
RBC: 4.04
RBC: 4.16
RDW: 13.8
RDW: 14.1
RDW: 14.3
WBC: 6.9
WBC: 7.5
WBC: 8.4

## 2011-01-17 LAB — URINALYSIS, ROUTINE W REFLEX MICROSCOPIC
Glucose, UA: NEGATIVE
Hgb urine dipstick: NEGATIVE
Leukocytes, UA: NEGATIVE
Protein, ur: >300 — AB
Urobilinogen, UA: 0.2
pH: 6

## 2011-01-17 LAB — URINE MICROSCOPIC-ADD ON

## 2011-01-17 LAB — FETAL FIBRONECTIN

## 2011-01-17 LAB — URIC ACID: Uric Acid, Serum: 8 — ABNORMAL HIGH

## 2011-01-17 LAB — STREP B DNA PROBE: Strep Group B Ag: NEGATIVE

## 2011-01-17 LAB — PROTEIN, URINE, 24 HOUR
Protein, 24H Urine: 132 — ABNORMAL HIGH
Protein, 24H Urine: 5474 — ABNORMAL HIGH
Protein, Urine: 238

## 2011-01-17 LAB — MAGNESIUM: Magnesium: 7.1

## 2011-01-23 ENCOUNTER — Encounter: Payer: Self-pay | Admitting: *Deleted

## 2011-01-23 LAB — DIFFERENTIAL
Lymphocytes Relative: 10 — ABNORMAL LOW
Lymphs Abs: 1.2
Monocytes Relative: 7
Neutro Abs: 9.4 — ABNORMAL HIGH
Neutrophils Relative %: 83 — ABNORMAL HIGH

## 2011-01-23 LAB — URINALYSIS, ROUTINE W REFLEX MICROSCOPIC
Glucose, UA: NEGATIVE
Ketones, ur: NEGATIVE
Nitrite: NEGATIVE
Specific Gravity, Urine: 1.01
pH: 7

## 2011-01-23 LAB — WET PREP, GENITAL
Clue Cells Wet Prep HPF POC: NONE SEEN
Trich, Wet Prep: NONE SEEN
Yeast Wet Prep HPF POC: NONE SEEN

## 2011-01-23 LAB — CBC
MCV: 75.3 — ABNORMAL LOW
Platelets: 191
RBC: 4.91
WBC: 11.3 — ABNORMAL HIGH

## 2011-01-23 LAB — POCT PREGNANCY, URINE
Operator id: 28886
Preg Test, Ur: NEGATIVE

## 2011-01-23 LAB — GC/CHLAMYDIA PROBE AMP, GENITAL
Chlamydia, DNA Probe: NEGATIVE
GC Probe Amp, Genital: NEGATIVE

## 2011-02-03 ENCOUNTER — Ambulatory Visit (INDEPENDENT_AMBULATORY_CARE_PROVIDER_SITE_OTHER): Payer: Medicaid Other | Admitting: Family Medicine

## 2011-02-03 DIAGNOSIS — O10919 Unspecified pre-existing hypertension complicating pregnancy, unspecified trimester: Secondary | ICD-10-CM

## 2011-02-03 DIAGNOSIS — Z23 Encounter for immunization: Secondary | ICD-10-CM

## 2011-02-03 DIAGNOSIS — I10 Essential (primary) hypertension: Secondary | ICD-10-CM

## 2011-02-03 DIAGNOSIS — O34219 Maternal care for unspecified type scar from previous cesarean delivery: Secondary | ICD-10-CM

## 2011-02-03 DIAGNOSIS — O09299 Supervision of pregnancy with other poor reproductive or obstetric history, unspecified trimester: Secondary | ICD-10-CM | POA: Insufficient documentation

## 2011-02-03 LAB — POCT URINALYSIS DIP (DEVICE)
Ketones, ur: NEGATIVE mg/dL
Leukocytes, UA: NEGATIVE
Protein, ur: NEGATIVE mg/dL
Urobilinogen, UA: 0.2 mg/dL (ref 0.0–1.0)
pH: 5.5 (ref 5.0–8.0)

## 2011-02-03 MED ORDER — INFLUENZA VIRUS VACC SPLIT PF IM SUSP: 0.5000 mL | Freq: Once | INTRAMUSCULAR | Status: DC

## 2011-02-03 NOTE — Progress Notes (Signed)
Edema-feet.  Pulse- 81.  No vaginal discharge.  Pt c/o of a "rush' in the am when she eats anything. Pt needs 28 week labs

## 2011-02-03 NOTE — Patient Instructions (Signed)
Pregnancy - Third Trimester The third trimester of pregnancy (the last 3 months) is a period of the most rapid growth for you and your baby. The baby approaches a length of 20 inches and a weight of 6 to 10 pounds. The baby is adding on fat and getting ready for life outside your body. While inside, babies have periods of sleeping and waking, suck their thumbs, and hiccups. You can often feel small contractions of the uterus. This is false labor. It is also called Braxton-Hicks contractions. This is like a practice for labor. The usual problems in this stage of pregnancy include more difficulty breathing, swelling of the hands and feet from water retention, and having to urinate more often because of the uterus and baby pressing on your bladder.  PRENATAL EXAMS  Blood work may continue to be done during prenatal exams. These tests are done to check on your health and the probable health of your baby. Blood work is used to follow your blood levels (hemoglobin). Anemia (low hemoglobin) is common during pregnancy. Iron and vitamins are given to help prevent this. You may also continue to be checked for diabetes. Some of the past blood tests may be done again.   The size of the uterus is measured during each visit. This makes sure your baby is growing properly according to your pregnancy dates.   Your blood pressure is checked every prenatal visit. This is to make sure you are not getting toxemia.   Your urine is checked every prenatal visit for infection, diabetes and protein.   Your weight is checked at each visit. This is done to make sure gains are happening at the suggested rate and that you and your baby are growing normally.   Sometimes, an ultrasound is performed to confirm the position and the proper growth and development of the baby. This is a test done that bounces harmless sound waves off the baby so your caregiver can more accurately determine due dates.   Discuss the type of pain  medication and anesthesia you will have during your labor and delivery.   Discuss the possibility and anesthesia if a Cesarean Section might be necessary.   Inform your caregiver if there is any mental or physical violence at home.  Sometimes, a specialized non-stress test, contraction stress test and biophysical profile are done to make sure the baby is not having a problem. Checking the amniotic fluid surrounding the baby is called an amniocentesis. The amniotic fluid is removed by sticking a needle into the belly (abdomen). This is sometimes done near the end of pregnancy if an early delivery is required. In this case, it is done to help make sure the baby's lungs are mature enough for the baby to live outside of the womb. If the lungs are not mature and it is unsafe to deliver the baby, an injection of cortisone medication is given to the mother 1 to 2 days before the delivery. This helps the baby's lungs mature and makes it safer to deliver the baby. CHANGES OCCURING IN THE THIRD TRIMESTER OF PREGNANCY Your body goes through many changes during pregnancy. They vary from person to person. Talk to your caregiver about changes you notice and are concerned about.  During the last trimester, you have probably had an increase in your appetite. It is normal to have cravings for certain foods. This varies from person to person and pregnancy to pregnancy.   You may begin to get stretch marks on your hips,   abdomen, and breasts. These are normal changes in the body during pregnancy. There are no exercises or medications to take which prevent this change.   Constipation may be treated with a stool softener or adding bulk to your diet. Drinking lots of fluids, fiber in vegetables, fruits, and whole grains are helpful.   Exercising is also helpful. If you have been very active up until your pregnancy, most of these activities can be continued during your pregnancy. If you have been less active, it is helpful  to start an exercise program such as walking. Consult your caregiver before starting exercise programs.   Avoid all smoking, alcohol, un-prescribed drugs, herbs and "street drugs" during your pregnancy. These chemicals affect the formation and growth of the baby. Avoid chemicals throughout the pregnancy to ensure the delivery of a healthy infant.   Backache, varicose veins and hemorrhoids may develop or get worse.   You will tire more easily in the third trimester, which is normal.   The baby's movements may be stronger and more often.   You may become short of breath easily.   Your belly button may stick out.   A yellow discharge may leak from your breasts called colostrum.   You may have a bloody mucus discharge. This usually occurs a few days to a week before labor begins.  HOME CARE INSTRUCTIONS  Keep your caregiver's appointments. Follow your caregiver's instructions regarding medication use, exercise, and diet.   During pregnancy, you are providing food for you and your baby. Continue to eat regular, well-balanced meals. Choose foods such as meat, fish, milk and other low fat dairy products, vegetables, fruits, and whole-grain breads and cereals. Your caregiver will tell you of the ideal weight gain.   A physical sexual relationship may be continued throughout pregnancy if there are no other problems such as early (premature) leaking of amniotic fluid from the membranes, vaginal bleeding, or belly (abdominal) pain.   Exercise regularly if there are no restrictions. Check with your caregiver if you are unsure of the safety of your exercises. Greater weight gain will occur in the last 2 trimesters of pregnancy. Exercising helps:   Control your weight.   Get you in shape for labor and delivery.   You lose weight after you deliver.   Rest a lot with legs elevated, or as needed for leg cramps or low back pain.   Wear a good support or jogging bra for breast tenderness during  pregnancy. This may help if worn during sleep. Pads or tissues may be used in the bra if you are leaking colostrum.   Do not use hot tubs, steam rooms, or saunas.   Wear your seat belt when driving. This protects you and your baby if you are in an accident.   Avoid raw meat, cat litter boxes and soil used by cats. These carry germs that can cause birth defects in the baby.   It is easier to loose urine during pregnancy. Tightening up and strengthening the pelvic muscles will help with this problem. You can practice stopping your urination while you are going to the bathroom. These are the same muscles you need to strengthen. It is also the muscles you would use if you were trying to stop from passing gas. You can practice tightening these muscles up 10 times a set and repeating this about 3 times per day. Once you know what muscles to tighten up, do not perform these exercises during urination. It is more likely to   cause an infection by backing up the urine.   Ask for help if you have financial, counseling or nutritional needs during pregnancy. Your caregiver will be able to offer counseling for these needs as well as refer you for other special needs.   Make a list of emergency phone numbers and have them available.   Plan on getting help from family or friends when you go home from the hospital.   Make a trial run to the hospital.   Take prenatal classes with the father to understand, practice and ask questions about the labor and delivery.   Prepare the baby's room/nursery.   Do not travel out of the city unless it is absolutely necessary and with the advice of your caregiver.   Wear only low or no heal shoes to have better balance and prevent falling.  MEDICATIONS AND DRUG USE IN PREGNANCY  Take prenatal vitamins as directed. The vitamin should contain 1 milligram of folic acid. Keep all vitamins out of reach of children. Only a couple vitamins or tablets containing iron may be fatal  to a baby or young child when ingested.   Avoid use of all medications, including herbs, over-the-counter medications, not prescribed or suggested by your caregiver. Only take over-the-counter or prescription medicines for pain, discomfort, or fever as directed by your caregiver. Do not use aspirin, ibuprofen (Motrin, Advil, Nuprin) or naproxen (Aleve) unless OK'd by your caregiver.   Let your caregiver also know about herbs you may be using.   Alcohol is related to a number of birth defects. This includes fetal alcohol syndrome. All alcohol, in any form, should be avoided completely. Smoking will cause low birth rate and premature babies.   Street/illegal drugs are very harmful to the baby. They are absolutely forbidden. A baby born to an addicted mother will be addicted at birth. The baby will go through the same withdrawal an adult does.  SEEK MEDICAL CARE IF  You have any concerns or worries during your pregnancy. It is better to call with your questions if you feel they cannot wait, rather than worry about them.  DECISIONS ABOUT CIRCUMCISION You may or may not know the sex of your baby. If you know your baby is a boy, it may be time to think about circumcision. Circumcision is the removal of the foreskin of the penis. This is the skin that covers the sensitive end of the penis. There is no proven medical need for this. Often this decision is made on what is popular at the time or based upon religious beliefs and social issues. You can discuss these issues with your caregiver or pediatrician. SEEK IMMEDIATE MEDICAL CARE IF:  An unexplained oral temperature above 101 develops, or as your caregiver suggests.   You have leaking of fluid from the vagina (birth canal). If leaking membranes are suspected, take your temperature and tell your caregiver of this when you call.   There is vaginal spotting, bleeding or passing clots. Tell your caregiver of the amount and how many pads are used.    You develop a bad smelling vaginal discharge with a change in the color from clear to white.   You develop vomiting that lasts more than 24 hours.   You develop chills or fever.   You develop shortness of breath.   You develop burning on urination.   You loose more than 2 pounds of weight or gain more than 2 pounds of weight or as suggested by your caregiver.     You notice sudden swelling of your face, hands, and feet or legs.   You develop belly (abdominal) pain. Round ligament discomfort is a common non-cancerous (benign) cause of abdominal pain in pregnancy. Your caregiver still must evaluate you.   You develop a severe headache that does not go away.   You develop visual problems, blurred or double vision.   If you have not felt your baby move for more than 1 hour. If you think the baby is not moving as much as usual, eat something with sugar in it and lie down on your left side for an hour. The baby should move at least 4 to 5 times per hour. Call right away if your baby moves less than that.   You fall, are in a car accident or any kind of trauma.   There is mental or physical violence at home.  Document Released: 04/08/2001 Document Re-Released: 02/09/2009 [employer] Patient Information 2011 [employer] 

## 2011-02-03 NOTE — Progress Notes (Signed)
Desires TOLAC-consent given, risks reveiwed. Subjective:    Renee Suarez is a 20 y.o. G2P0101 [redacted]w[redacted]d being seen today for her obstetrical visit.  Patient reports no complaints. Fetal movement: normal.  Objective:    BP 110/74  Temp 97.4 F (36.3 C)  Wt 201 lb 1.6 oz (91.218 kg)  Physical Exam  Exam  FHT:  142 BPM  Uterine Size: 30 cm  Presentation: unsure     Assessment:    Pregnancy:  G2P0101    Plan:    Patient Active Problem List  Diagnoses  . Hypertension  . Unspecified hypertension antepartum  . Previous cesarean delivery, antepartum condition or complication  . Prior pregnancy complicated by eclampsia, antepartum    U/S for growth. Follow up in 2 Weeks.

## 2011-02-04 ENCOUNTER — Ambulatory Visit (HOSPITAL_COMMUNITY)
Admission: RE | Admit: 2011-02-04 | Discharge: 2011-02-04 | Disposition: A | Discharge status: Home / Self Care | Payer: Medicaid Other | Source: Ambulatory Visit | Attending: Family Medicine | Admitting: Family Medicine

## 2011-02-04 DIAGNOSIS — I10 Essential (primary) hypertension: Secondary | ICD-10-CM

## 2011-02-04 DIAGNOSIS — Z8751 Personal history of pre-term labor: Secondary | ICD-10-CM | POA: Insufficient documentation

## 2011-02-04 DIAGNOSIS — O09299 Supervision of pregnancy with other poor reproductive or obstetric history, unspecified trimester: Secondary | ICD-10-CM | POA: Insufficient documentation

## 2011-02-04 LAB — GLUCOSE TOLERANCE, 1 HOUR: Glucose, 1 Hour GTT: 88 mg/dL (ref 70–140)

## 2011-02-04 LAB — RPR

## 2011-02-04 LAB — HIV ANTIBODY (ROUTINE TESTING W REFLEX): HIV: NONREACTIVE

## 2011-02-04 LAB — CBC
Hemoglobin: 10.5 g/dL — ABNORMAL LOW (ref 12.0–15.0)
MCH: 25.3 pg — ABNORMAL LOW (ref 26.0–34.0)
MCHC: 32.6 g/dL (ref 30.0–36.0)
RDW: 13.8 % (ref 11.5–15.5)

## 2011-02-05 LAB — URINALYSIS, ROUTINE W REFLEX MICROSCOPIC
Bilirubin Urine: NEGATIVE
Hgb urine dipstick: NEGATIVE
Ketones, ur: 15 — AB
Specific Gravity, Urine: 1.01
Urobilinogen, UA: 1
pH: 8

## 2011-02-05 LAB — URINE MICROSCOPIC-ADD ON

## 2011-02-05 LAB — URINE CULTURE

## 2011-02-10 LAB — URINALYSIS, ROUTINE W REFLEX MICROSCOPIC
Bilirubin Urine: NEGATIVE
Ketones, ur: 15 — AB
Nitrite: NEGATIVE
Protein, ur: NEGATIVE

## 2011-02-10 LAB — ABO/RH: ABO/RH(D): A POS

## 2011-02-10 LAB — WET PREP, GENITAL: Trich, Wet Prep: NONE SEEN

## 2011-02-10 LAB — HCG, QUANTITATIVE, PREGNANCY
hCG, Beta Chain, Quant, S: 1033 — ABNORMAL HIGH
hCG, Beta Chain, Quant, S: 349 — ABNORMAL HIGH

## 2011-02-10 LAB — POCT PREGNANCY, URINE: Operator id: 242691

## 2011-02-24 ENCOUNTER — Ambulatory Visit (INDEPENDENT_AMBULATORY_CARE_PROVIDER_SITE_OTHER): Payer: Medicaid Other | Admitting: Family Medicine

## 2011-02-24 DIAGNOSIS — O34219 Maternal care for unspecified type scar from previous cesarean delivery: Secondary | ICD-10-CM

## 2011-02-24 DIAGNOSIS — G4700 Insomnia, unspecified: Secondary | ICD-10-CM

## 2011-02-24 DIAGNOSIS — O10019 Pre-existing essential hypertension complicating pregnancy, unspecified trimester: Secondary | ICD-10-CM

## 2011-02-24 DIAGNOSIS — J029 Acute pharyngitis, unspecified: Secondary | ICD-10-CM

## 2011-02-24 MED ORDER — DIPHENHYDRAMINE-APAP (SLEEP) 25-500 MG PO TABS: 1.0000 | ORAL_TABLET | Freq: Every evening | ORAL | Status: DC | PRN

## 2011-02-24 NOTE — Patient Instructions (Addendum)
Birth Control Choices Birth control is the use of any practices, methods, or devices to prevent pregnancy from happening in a sexually active woman.  Below are some birth control choices to help avoid pregnancy.  Not having sex (abstinence) is the surest form of birth control. This requires self-control. There is no risk of acquiring a sexually transmitted disease (STD), including acquired immunodeficiency syndrome (AIDS).   Periodic abstinence requires self-control during certain times of the month.   Calendar method, timing your menstrual periods from month to month.   Ovulation method is avoiding sexual intercourse around the time you produce an egg (ovulate).   Symptotherm method is avoiding sexual intercourse at the time of ovulation, using a thermometer and ovulation symptoms.   Post ovulation method is the timing of sexual intercourse after you ovulated.  These methods do not protect against STDs, including AIDS.  Birth control pills (BCPs) contain estrogen and progesterone hormone. These medicines work by stopping the egg from forming in the ovary (ovulation). Birth control pills are prescribed by a caregiver who will ask you questions about the risks of taking BCPs. Birth control pills do not protect against STDs, including AIDS.   "Minipill" birth control pills have only the progesterone hormone. They are taken every day of each month and must be prescribed by your caregiver. They do not protect against STDs, including AIDS.   Emergency contraception is often call the "morning after" pill. This pill can be taken right after sex or up to five days after sex if you think your birth control failed, you failed to use contraception, or you were forced to have sex. It is most effective the sooner you take the pills after having sexual intercourse. Do not use emergency contraception as your only form of birth control. Emergency contraceptive pills are available without a prescription. Check  with your pharmacist.   Condoms are a thin sheath of latex, synthetic material, or lambskin worn over the penis during sexual intercourse. They can have a spermicide in or on them when you buy them. Latex condoms can prevent pregnancy and STDs. "Natural" or lambskin condoms can prevent pregnancy but may not protect against STDs, including AIDS.   Female condoms are a soft, loose-fitting sheath that is put into the vagina before sexual intercourse. They can prevent pregnancy and STDs, including AIDS.   Sponge is a soft, circular piece of polyurethane foam with spermicide in it that is inserted into the vagina after wetting it and before sexual intercourse. It does not require a prescription from your caregiver. It does not protect against STDs, including AIDS.   Diaphragm is a soft, latex, dome-shaped barrier that must be fitted by a caregiver. It is inserted into the vagina, along with a spermicidal jelly. After the proper fitting for a diaphragm, always insert the diaphragm before intercourse. The diaphragm should be left in the vagina for 6 to 8 hours after intercourse. Removal and reinsertion with a spermicide is always necessary after any use. It does not protect against STDs, including AIDS.   Progesterone-only injections are given every 3 months to prevent pregnancy. These injections contain synthetic progesterone and no estrogen. This hormone stops the ovaries from releasing eggs. It also causes the cervical mucus to thicken and changes the uterine lining. This makes it harder for sperm to survive in the uterus. It does not protect against STDs, including AIDS.   Birth Control Patch contains hormones similar to those in birth control pills, so effectiveness, risks, and side effects   are similar. It must be changed once a week and is prescribed by a caregiver. It is less effective in very overweight women. It does not protect against STDs, including AIDS.   Vaginal Ring contains hormones similar  to those in birth control pills. It is left in place for 3 weeks, removed for 1 week, and then a new one is put back into the vagina. It comes with a timer to put in your purse to help you remember when to take it out or put a new one in. A caregiver's examination and prescription is necessary, just like with birth control pills and the patch. It does not protect against STDs, including AIDS.   Estrogen plus progesterone injections are given every 28 to 30 days. They can be given in the upper arm, thigh, or buttocks. It does not protect against STDs, including AIDS.   Intrauterine device (IUD): copper T or progestin filled is a T-shaped device that is put in a woman's uterus during a menstrual period to prevent pregnancy. The copper T IUD can last 10 years, and the progestin IUD can last 5 years. The progestin IUD can also help control heavy menstrual periods. It does not protect against STDs, including AIDS. The copper T IUD can be used as emergency contraception if inserted within 5 days of having unprotected intercourse.   Cervical cap is a round, soft latex or plastic cup that fits over the cervix and must be fitted by a caregiver. You do not need to use a spermicide with it or remove and insert it every time you have sexual intercourse. It does not protect against STDs, including AIDS.   Spermicides are chemicals that kill or block sperm from entering the cervix and uterus. They come in the form of creams, jellies, suppositories, foam, or tablets, and they do not require a prescription. They are inserted into the vagina with an applicator before having sexual intercourse. This must be repeated every time you have sexual intercourse.   Withdrawal is using the method of the female withdrawing his penis from sexual intercourse before he has a climax and deposits his sperm. It does not protect against STDs, including AIDS.   Female tubal ligation is when the woman's fallopian tubes are surgically sealed  or tied to prevent the egg from traveling to the uterus. It does not protect against STDs, including AIDS.   Female sterilization is when the female has his tubes that carry sperm tied off (vasectomy) to stop sperm from entering the vagina during sexual intercourse. It does not protect against STDs, including AIDS.  Regardless of which method of birth control you choose, it is still important that you use some form of protection against STDs. Document Released: 04/14/2005 Document Revised: 05/17/2010 Document Reviewed: 03/01/2009 [employer] Patient Information 2012 [employer]. Breastfeeding BENEFITS OF BREASTFEEDING For the baby  The first milk (colostrum) helps the baby's digestive system function better.   There are antibodies from the mother in the milk that help the baby fight off infections.   The baby has a lower incidence of asthma, allergies, and SIDS (sudden infant death syndrome).   The nutrients in breast milk are better than formulas for the baby and helps the baby's brain grow better.   Babies who breastfeed have less gas, colic, and constipation.  For the mother  Breastfeeding helps develop a very special bond between mother and baby.   It is more convenient, always available at the correct temperature and cheaper than formula feeding.     It burns calories in the mother and helps with losing weight that was gained during pregnancy.   It makes the uterus contract back down to normal size faster and slows bleeding following delivery.   Breastfeeding mothers have a lower risk of developing breast cancer.  NURSE FREQUENTLY  A healthy, full-term baby may breastfeed as often as every hour or space his or her feedings to every 3 hours.   How often to nurse will vary from baby to baby. Watch your baby for signs of hunger, not the clock.   Nurse as often as the baby requests, or when you feel the need to reduce the fullness of your breasts.   Awaken the baby if it has been 3  to 4 hours since the last feeding.   Frequent feeding will help the mother make more milk and will prevent problems like sore nipples and engorgement of the breasts.  BABY'S POSITION AT THE BREAST  Whether lying down or sitting, be sure that the baby's tummy is facing your tummy.   Support the breast with 4 fingers underneath the breast and the thumb above. Make sure your fingers are well away from the nipple and baby's mouth.   Stroke the baby's lips and cheek closest to the breast gently with your finger or nipple.   When the baby's mouth is open wide enough, place all of your nipple and as much of the dark area around the nipple as possible into your baby's mouth.   Pull the baby in close so the tip of the nose and the baby's cheeks touch the breast during the feeding.  FEEDINGS  The length of each feeding varies from baby to baby and from feeding to feeding.   The baby must suck about 2 to 3 minutes for your milk to get to him or her. This is called a "let down." For this reason, allow the baby to feed on each breast as long as he or she wants. Your baby will end the feeding when he or she has received the right balance of nutrients.   To break the suction, put your finger into the corner of the baby's mouth and slide it between his or her gums before removing your breast from his or her mouth. This will help prevent sore nipples.  REDUCING BREAST ENGORGEMENT  In the first week after your baby is born, you may experience signs of breast engorgement. When breasts are engorged, they feel heavy, warm, full, and may be tender to the touch. You can reduce engorgement if you:   Nurse frequently, every 2 to 3 hours. Mothers who breastfeed early and often have fewer problems with engorgement.   Place light ice packs on your breasts between feedings. This reduces swelling. Wrap the ice packs in a lightweight towel to protect your skin.   Apply moist hot packs to your breast for 5 to 10  minutes before each feeding. This increases circulation and helps the milk flow.   Gently massage your breast before and during the feeding.   Make sure that the baby empties at least one breast at every feeding before switching sides.   Use a breast pump to empty the breasts if your baby is sleepy or not nursing well. You may also want to pump if you are returning to work or or you feel you are getting engorged.   Avoid bottle feeds, pacifiers or supplemental feedings of water or juice in place of breastfeeding.   Be sure   the baby is latched on and positioned properly while breastfeeding.   Prevent fatigue, stress, and anemia.   Wear a supportive bra, avoiding underwire styles.   Eat a balanced diet with enough fluids.  If you follow these suggestions, your engorgement should improve in 24 to 48 hours. If you are still experiencing difficulty, call your lactation consultant or caregiver. IS MY BABY GETTING ENOUGH MILK? Sometimes, mothers worry about whether their babies are getting enough milk. You can be assured that your baby is getting enough milk if:  The baby is actively sucking and you hear swallowing.   The baby nurses at least 8 to 12 times in a 24 hour time period. Nurse your baby until he or she unlatches or falls asleep at the first breast (at least 10 to 20 minutes), then offer the second side.   The baby is wetting 5 to 6 disposable diapers (6 to 8 cloth diapers) in a 24 hour period by 5 to 6 days of age.   The baby is having at least 2 to 3 stools every 24 hours for the first few months. Breast milk is all the food your baby needs. It is not necessary for your baby to have water or formula. In fact, to help your breasts make more milk, it is best not to give your baby supplemental feedings during the early weeks.   The stool should be soft and yellow.   The baby should gain 4 to 7 ounces per week after he is 4 days old.  TAKE CARE OF YOURSELF Take care of your breasts  by:  Bathing or showering daily.   Avoiding the use of soaps on your nipples.   Start feedings on your left breast at one feeding and on your right breast at the next feeding.   You will notice an increase in your milk supply 2 to 5 days after delivery. You may feel some discomfort from engorgement, which makes your breasts very firm and often tender. Engorgement "peaks" out within 24 to 48 hours. In the meantime, apply warm moist towels to your breasts for 5 to 10 minutes before feeding. Gentle massage and expression of some milk before feeding will soften your breasts, making it easier for your baby to latch on. Wear a well fitting nursing bra and air dry your nipples for 10 to 15 minutes after each feeding.   Only use cotton bra pads.   Only use pure lanolin on your nipples after nursing. You do not need to wash it off before nursing.  Take care of yourself by:   Eating well-balanced meals and nutritious snacks.   Drinking milk, fruit juice, and water to satisfy your thirst (about 8 glasses a day).   Getting plenty of rest.   Increasing calcium in your diet (1200 mg a day).   Avoiding foods that you notice affect the baby in a bad way.  SEEK MEDICAL CARE IF:   You have any questions or difficulty with breastfeeding.   You need help.   You have a hard, red, sore area on your breast, accompanied by a fever of 100.5 F (38.1 C) or more.   Your baby is too sleepy to eat well or is having trouble sleeping.   Your baby is wetting less than 6 diapers per day, by 5 days of age.   Your baby's skin or white part of his or her eyes is more yellow than it was in the hospital.   You feel   depressed.  Document Released: 04/14/2005 Document Revised: 12/25/2010 Document Reviewed: 11/27/2008 [facility] Patient Information 2012 [place], [place].Strep Throat Strep throat is an infection of the throat caused by a bacteria named Streptococcus pyogenes. Your caregiver may call the infection  streptococcal "tonsillitis" or "pharyngitis" depending on whether there are signs of inflammation in the tonsils or back of the throat. Strep throat is most common in children from 52 to 62 years old during the cold months of the year, but it can occur in people of any age during any season. This infection is spread from person to person (contagious) through coughing, sneezing, or other close contact. SYMPTOMS   Fever or chills.   Painful, swollen, red tonsils or throat.   Pain or difficulty when swallowing.   White or yellow spots on the tonsils or throat.   Swollen, tender lymph nodes or "glands" of the neck or under the jaw.   Red rash all over the body (rare).  DIAGNOSIS  Many different infections can cause the same symptoms. A test must be done to confirm the diagnosis so the right treatment can be given. A "rapid strep test" can help your caregiver make the diagnosis in a few minutes. If this test is not available, a light swab of the infected area can be used for a throat culture test. If a throat culture test is done, results are usually available in a day or two. TREATMENT  Strep throat is treated with antibiotic medicine. HOME CARE INSTRUCTIONS   Gargle with 1 tsp of salt in 1 cup of warm water, 3 to 4 times per day or as needed for comfort.   Family members who also have a sore throat or fever should be tested for strep throat and treated with antibiotics if they have the strep infection.   Make sure everyone in your household washes their hands well.   Do not share food, drinking cups, or personal items that could cause the infection to spread to others.   You may need to eat a soft food diet until your sore throat gets better.   Drink enough water and fluids to keep your urine clear or pale yellow. This will help prevent dehydration.   Get plenty of rest.   Stay home from school, daycare, or work until you have been on antibiotics for 24 hours.   Only take  over-the-counter or prescription medicines for pain, discomfort, or fever as directed by your caregiver.   If antibiotics are prescribed, take them as directed. Finish them even if you start to feel better.  SEEK MEDICAL CARE IF:   The glands in your neck continue to enlarge.   You develop a rash, cough, or earache.   You cough up green, yellow-brown, or bloody sputum.   You have pain or discomfort not controlled by medicines.   Your problems seem to be getting worse rather than better.  SEEK IMMEDIATE MEDICAL CARE IF:   You develop any new symptoms such as vomiting, severe headache, stiff or painful neck, chest pain, shortness of breath, or trouble swallowing.   You develop severe throat pain, drooling, or changes in your voice.   You develop swelling of the neck, or the skin on the neck becomes red and tender.   You have a fever.   You develop signs of dehydration, such as fatigue, dry mouth, and decreased urination.   You become increasingly sleepy, or you cannot wake up completely.  Document Released: 04/11/2000 Document Revised: 12/25/2010 Document Reviewed: 06/13/2010  [employer] Patient Information 2012 [place].

## 2011-02-24 NOTE — Progress Notes (Signed)
Emesis last pm-unable to void.  Having difficulty sleeping, having some headache. Eating corn starch. U/S last showed 80%, nml fluid, nml cervical length and breech. Sore throat with Strep exposure-will test.

## 2011-02-24 NOTE — Progress Notes (Signed)
Frequent Headaches. Pt states having sore throat and was around sister that was diagnosed with strep. Pulse 110. Swelling in feet. Pain and pressure in pelvic area. No vaginal discharge.

## 2011-02-25 LAB — RAPID STREP SCREEN (MED CTR MEBANE ONLY)

## 2011-03-11 ENCOUNTER — Telehealth: Payer: Self-pay | Admitting: *Deleted

## 2011-03-11 ENCOUNTER — Inpatient Hospital Stay (HOSPITAL_COMMUNITY)
Admission: AD | Admit: 2011-03-11 | Discharge: 2011-03-11 | Disposition: A | Discharge status: Home / Self Care | Payer: Medicaid Other | Source: Ambulatory Visit | Attending: Family Medicine | Admitting: Family Medicine

## 2011-03-11 ENCOUNTER — Encounter (HOSPITAL_COMMUNITY): Payer: Self-pay

## 2011-03-11 DIAGNOSIS — N898 Other specified noninflammatory disorders of vagina: Secondary | ICD-10-CM | POA: Insufficient documentation

## 2011-03-11 DIAGNOSIS — R109 Unspecified abdominal pain: Secondary | ICD-10-CM | POA: Insufficient documentation

## 2011-03-11 DIAGNOSIS — O09299 Supervision of pregnancy with other poor reproductive or obstetric history, unspecified trimester: Secondary | ICD-10-CM

## 2011-03-11 DIAGNOSIS — O169 Unspecified maternal hypertension, unspecified trimester: Secondary | ICD-10-CM

## 2011-03-11 DIAGNOSIS — O34219 Maternal care for unspecified type scar from previous cesarean delivery: Secondary | ICD-10-CM

## 2011-03-11 DIAGNOSIS — O99891 Other specified diseases and conditions complicating pregnancy: Secondary | ICD-10-CM | POA: Insufficient documentation

## 2011-03-11 LAB — URINALYSIS, ROUTINE W REFLEX MICROSCOPIC
Bilirubin Urine: NEGATIVE
Glucose, UA: NEGATIVE mg/dL
Ketones, ur: NEGATIVE mg/dL
Leukocytes, UA: NEGATIVE
Protein, ur: NEGATIVE mg/dL
pH: 7 (ref 5.0–8.0)

## 2011-03-11 LAB — WET PREP, GENITAL: Clue Cells Wet Prep HPF POC: NONE SEEN

## 2011-03-11 NOTE — Telephone Encounter (Signed)
Pt called stating was having pain and pressure in pelvic area. Also pain in abdominal area that has been going on for over an hour. No discharge. Pt also states does not think she is "leaking fluid" but is not sure. Advised pt to go to [facility] for evaluation. Pt voiced understanding.

## 2011-03-11 NOTE — Progress Notes (Signed)
Patient states she has been having abdominal pressure and tightening. Feeling pressure in "bottom" that is constant. Reports good fetal movement, no bleeding or leaking.

## 2011-03-11 NOTE — ED Provider Notes (Addendum)
History     Chief Complaint  Patient presents with  . Abdominal Pain   HPI 20 year old [id number] at 63 and 4 days who presents with pelvic pressure that started approximately 1 hour ago.  Denies cramping or contractions, nausea, vomiting, diarrhea.  Does have white discharge that started a couple days ago and has not gotten better.  Her pelvic pressure is better with movement.  Nothing makes it worse.  THere is no radiation to her discomfort.  Reports good fetal activity.  OB History    Grav Para Term Preterm Abortions TAB SAB Ect Mult Living   2 1 0 1 0 0 0 0 0 1       Past Medical History  Diagnosis Date  . Anemia   . Preterm labor   . GERD (gastroesophageal reflux disease)   . Seizures     eclampsia  . Hypertension   . Pregnancy induced hypertension     Past Surgical History  Procedure Date  . Cesarean section   . Cholecystectomy     Family History  Problem Relation Age of Onset  . Mental illness Brother   . Mental retardation Brother   . Hypertension Paternal Grandmother   . Diabetes Paternal Grandmother     History  Substance Use Topics  . Smoking status: Never Smoker   . Smokeless tobacco: Never Used  . Alcohol Use: No    Allergies: No Known Allergies  Prescriptions prior to admission  Medication Sig Dispense Refill  . acetaminophen (TYLENOL) 500 MG tablet Take 1,000 mg by mouth every 6 (six) hours as needed. Patient took medication for a headache.       . diphenhydramine-acetaminophen (TYLENOL PM) 25-500 MG TABS Take 1 tablet by mouth at bedtime as needed (insomnia).  30 tablet  2    Review of Systems  All other systems reviewed and are negative.   Physical Exam   Blood pressure 112/63, pulse 72, temperature 98.7 F (37.1 C), temperature source Oral, resp. rate 20, height 5' 1.5" (1.562 m), weight 92.534 kg (204 lb), SpO2 99.00%.  Physical Exam  Constitutional: She appears well-developed and well-nourished.  HENT:  Head: Normocephalic and  atraumatic.  GI: Soft. Bowel sounds are normal. She exhibits no distension and no mass. There is no tenderness. There is no rebound and no guarding.       Fundal height approximates dates.  Vertex by leopold.  Musculoskeletal:       Tender right piriformis.   Skin: Skin is warm and dry.     Cervical exam: closed/thick/long firm  Wet prep: positive only for WBC's  UA: Negative for leukocytes, nitrites, protein.  Toco: CTX x 1 in 2 hours FHM: 130, moderate variability, accels present, no decel - Cat 1  MAU Course  Procedures  MDM   Assessment and Plan  Will get Wet prep to r/o bacterial vaginosis for vaginal discharge.  Patient signed out to Dr Clinton Sawyer.  No evidence of infection or active labor, so patient will be discharged to home.   STINSON, JACOB JEHIEL 03/11/2011, 4:32 PM   Si Raider. Maryann Conners.D.

## 2011-03-11 NOTE — Progress Notes (Addendum)
Pt states having lower abd pressure x1hour, feels like baby is balling up. Noted white creamy discharge. No recent intercourse. Denies bleeding, +FM.

## 2011-03-12 LAB — GC/CHLAMYDIA PROBE AMP, GENITAL: Chlamydia, DNA Probe: NEGATIVE

## 2011-03-31 ENCOUNTER — Ambulatory Visit (INDEPENDENT_AMBULATORY_CARE_PROVIDER_SITE_OTHER): Payer: Medicaid Other | Admitting: Obstetrics & Gynecology

## 2011-03-31 ENCOUNTER — Other Ambulatory Visit: Payer: Self-pay | Admitting: Family Medicine

## 2011-03-31 DIAGNOSIS — O09299 Supervision of pregnancy with other poor reproductive or obstetric history, unspecified trimester: Secondary | ICD-10-CM

## 2011-03-31 DIAGNOSIS — O0990 Supervision of high risk pregnancy, unspecified, unspecified trimester: Secondary | ICD-10-CM

## 2011-03-31 DIAGNOSIS — I10 Essential (primary) hypertension: Secondary | ICD-10-CM

## 2011-03-31 DIAGNOSIS — Z23 Encounter for immunization: Secondary | ICD-10-CM

## 2011-03-31 LAB — POCT URINALYSIS DIP (DEVICE)
Bilirubin Urine: NEGATIVE
Glucose, UA: NEGATIVE mg/dL
Hgb urine dipstick: NEGATIVE
Leukocytes, UA: NEGATIVE
Nitrite: NEGATIVE
Urobilinogen, UA: 0.2 mg/dL (ref 0.0–1.0)

## 2011-03-31 MED ORDER — INFLUENZA VIRUS VACC SPLIT PF IM SUSP
0.5000 mL | Freq: Once | INTRAMUSCULAR | Status: AC
  Administered 2011-03-31: 0.5 mL via INTRAMUSCULAR

## 2011-03-31 NOTE — Progress Notes (Signed)
Swelling in feet and hands per pt. Pelvic pressure and headaches. No vaginal bleeding and no vaginal discharge. Pulse 89.

## 2011-03-31 NOTE — Progress Notes (Signed)
Pt missed some appts due to "hard times"  Size > dates (fundal ht = 40).  Wants flu shot today.  Address Tdap next visit.

## 2011-03-31 NOTE — Patient Instructions (Signed)
Levonorgestrel intrauterine device (IUD) What is this medicine? LEVONORGESTREL IUD (LEE voe nor jes trel) is a contraceptive (birth control) device. It is used to prevent pregnancy and to treat heavy bleeding that occurs during your period. It can be used for up to 5 years. This medicine may be used for other purposes; ask your health care provider or pharmacist if you have questions. What should I tell my health care provider before I take this medicine? They need to know if you have any of these conditions: -abnormal Pap smear -cancer of the breast, uterus, or cervix -diabetes -endometritis -genital or pelvic infection now or in the past -have more than one sexual partner or your partner has more than one partner -heart disease -history of an ectopic or tubal pregnancy -immune system problems -IUD in place -liver disease or tumor -problems with blood clots or take blood-thinners -use intravenous drugs -uterus of unusual shape -vaginal bleeding that has not been explained -an unusual or allergic reaction to levonorgestrel, other hormones, silicone, or polyethylene, medicines, foods, dyes, or preservatives -pregnant or trying to get pregnant -breast-feeding How should I use this medicine? This device is placed inside the uterus by a health care professional. Talk to your pediatrician regarding the use of this medicine in children. Special care may be needed. Overdosage: If you think you have taken too much of this medicine contact a poison control center or emergency room at once. NOTE: This medicine is only for you. Do not share this medicine with others. What if I miss a dose? This does not apply. What may interact with this medicine? Do not take this medicine with any of the following medications: -amprenavir -bosentan -fosamprenavir This medicine may also interact with the following medications: -aprepitant -barbiturate medicines for inducing sleep or treating  seizures -bexarotene -griseofulvin -medicines to treat seizures like carbamazepine, ethotoin, felbamate, oxcarbazepine, phenytoin, topiramate -modafinil -pioglitazone -rifabutin -rifampin -rifapentine -some medicines to treat HIV infection like atazanavir, indinavir, lopinavir, nelfinavir, tipranavir, ritonavir -St. John's wort -warfarin This list may not describe all possible interactions. Give your health care provider a list of all the medicines, herbs, non-prescription drugs, or dietary supplements you use. Also tell them if you smoke, drink alcohol, or use illegal drugs. Some items may interact with your medicine. What should I watch for while using this medicine? Visit your doctor or health care professional for regular check ups. See your doctor if you or your partner has sexual contact with others, becomes HIV positive, or gets a sexual transmitted disease. This product does not protect you against HIV infection (AIDS) or other sexually transmitted diseases. You can check the placement of the IUD yourself by reaching up to the top of your vagina with clean fingers to feel the threads. Do not pull on the threads. It is a good habit to check placement after each menstrual period. Call your doctor right away if you feel more of the IUD than just the threads or if you cannot feel the threads at all. The IUD may come out by itself. You may become pregnant if the device comes out. If you notice that the IUD has come out use a backup birth control method like condoms and call your health care provider. Using tampons will not change the position of the IUD and are okay to use during your period. What side effects may I notice from receiving this medicine? Side effects that you should report to your doctor or health care professional as soon as possible: -allergic reactions   like skin rash, itching or hives, swelling of the face, lips, or tongue -fever, flu-like symptoms -genital sores -high  blood pressure -no menstrual period for 6 weeks during use -pain, swelling, warmth in the leg -pelvic pain or tenderness -severe or sudden headache -signs of pregnancy -stomach cramping -sudden shortness of breath -trouble with balance, talking, or walking -unusual vaginal bleeding, discharge -yellowing of the eyes or skin Side effects that usually do not require medical attention (report to your doctor or health care professional if they continue or are bothersome): -acne -breast pain -change in sex drive or performance -changes in weight -cramping, dizziness, or faintness while the device is being inserted -headache -irregular menstrual bleeding within first 3 to 6 months of use -nausea This list may not describe all possible side effects. Call your doctor for medical advice about side effects. You may report side effects to FDA at 1-800-FDA-1088. Where should I keep my medicine? This does not apply. NOTE: This sheet is a summary. It may not cover all possible information. If you have questions about this medicine, talk to your doctor, pharmacist, or health care provider.  2012, [employer]/Gold Standard. (05/05/2008 6:39:08 PM)Breastfeeding BENEFITS OF BREASTFEEDING For the baby  The first milk (colostrum) helps the baby's digestive system function better.   There are antibodies from the mother in the milk that help the baby fight off infections.   The baby has a lower incidence of asthma, allergies, and SIDS (sudden infant death syndrome).   The nutrients in breast milk are better than formulas for the baby and helps the baby's brain grow better.   Babies who breastfeed have less gas, colic, and constipation.  For the mother  Breastfeeding helps develop a very special bond between mother and baby.   It is more convenient, always available at the correct temperature and cheaper than formula feeding.   It burns calories in the mother and helps with losing weight that was  gained during pregnancy.   It makes the uterus contract back down to normal size faster and slows bleeding following delivery.   Breastfeeding mothers have a lower risk of developing breast cancer.  NURSE FREQUENTLY  A healthy, full-term baby may breastfeed as often as every hour or space his or her feedings to every 3 hours.   How often to nurse will vary from baby to baby. Watch your baby for signs of hunger, not the clock.   Nurse as often as the baby requests, or when you feel the need to reduce the fullness of your breasts.   Awaken the baby if it has been 3 to 4 hours since the last feeding.   Frequent feeding will help the mother make more milk and will prevent problems like sore nipples and engorgement of the breasts.  BABY'S POSITION AT THE BREAST  Whether lying down or sitting, be sure that the baby's tummy is facing your tummy.   Support the breast with 4 fingers underneath the breast and the thumb above. Make sure your fingers are well away from the nipple and baby's mouth.   Stroke the baby's lips and cheek closest to the breast gently with your finger or nipple.   When the baby's mouth is open wide enough, place all of your nipple and as much of the dark area around the nipple as possible into your baby's mouth.   Pull the baby in close so the tip of the nose and the baby's cheeks touch the breast during the feeding.  FEEDINGS  The length of each feeding varies from baby to baby and from feeding to feeding.   The baby must suck about 2 to 3 minutes for your milk to get to him or her. This is called a "let down." For this reason, allow the baby to feed on each breast as long as he or she wants. Your baby will end the feeding when he or she has received the right balance of nutrients.   To break the suction, put your finger into the corner of the baby's mouth and slide it between his or her gums before removing your breast from his or her mouth. This will help prevent  sore nipples.  REDUCING BREAST ENGORGEMENT  In the first week after your baby is born, you may experience signs of breast engorgement. When breasts are engorged, they feel heavy, warm, full, and may be tender to the touch. You can reduce engorgement if you:   Nurse frequently, every 2 to 3 hours. Mothers who breastfeed early and often have fewer problems with engorgement.   Place light ice packs on your breasts between feedings. This reduces swelling. Wrap the ice packs in a lightweight towel to protect your skin.   Apply moist hot packs to your breast for 5 to 10 minutes before each feeding. This increases circulation and helps the milk flow.   Gently massage your breast before and during the feeding.   Make sure that the baby empties at least one breast at every feeding before switching sides.   Use a breast pump to empty the breasts if your baby is sleepy or not nursing well. You may also want to pump if you are returning to work or or you feel you are getting engorged.   Avoid bottle feeds, pacifiers or supplemental feedings of water or juice in place of breastfeeding.   Be sure the baby is latched on and positioned properly while breastfeeding.   Prevent fatigue, stress, and anemia.   Wear a supportive bra, avoiding underwire styles.   Eat a balanced diet with enough fluids.  If you follow these suggestions, your engorgement should improve in 24 to 48 hours. If you are still experiencing difficulty, call your lactation consultant or caregiver. IS MY BABY GETTING ENOUGH MILK? Sometimes, mothers worry about whether their babies are getting enough milk. You can be assured that your baby is getting enough milk if:  The baby is actively sucking and you hear swallowing.   The baby nurses at least 8 to 12 times in a 24 hour time period. Nurse your baby until he or she unlatches or falls asleep at the first breast (at least 10 to 20 minutes), then offer the second side.   The baby is  wetting 5 to 6 disposable diapers (6 to 8 cloth diapers) in a 24 hour period by 62 to 96 days of age.   The baby is having at least 2 to 3 stools every 24 hours for the first few months. Breast milk is all the food your baby needs. It is not necessary for your baby to have water or formula. In fact, to help your breasts make more milk, it is best not to give your baby supplemental feedings during the early weeks.   The stool should be soft and yellow.   The baby should gain 4 to 7 ounces per week after he is 59 days old.  TAKE CARE OF YOURSELF Take care of your breasts by:  Bathing or showering daily.  Avoiding the use of soaps on your nipples.   Start feedings on your left breast at one feeding and on your right breast at the next feeding.   You will notice an increase in your milk supply 2 to 5 days after delivery. You may feel some discomfort from engorgement, which makes your breasts very firm and often tender. Engorgement "peaks" out within 24 to 48 hours. In the meantime, apply warm moist towels to your breasts for 5 to 10 minutes before feeding. Gentle massage and expression of some milk before feeding will soften your breasts, making it easier for your baby to latch on. Wear a well fitting nursing bra and air dry your nipples for 10 to 15 minutes after each feeding.   Only use cotton bra pads.   Only use pure lanolin on your nipples after nursing. You do not need to wash it off before nursing.  Take care of yourself by:   Eating well-balanced meals and nutritious snacks.   Drinking milk, fruit juice, and water to satisfy your thirst (about 8 glasses a day).   Getting plenty of rest.   Increasing calcium in your diet (1200 mg a day).   Avoiding foods that you notice affect the baby in a bad way.  SEEK MEDICAL CARE IF:   You have any questions or difficulty with breastfeeding.   You need help.   You have a hard, red, sore area on your breast, accompanied by a fever of 100.5  F (38.1 C) or more.   Your baby is too sleepy to eat well or is having trouble sleeping.   Your baby is wetting less than 6 diapers per day, by 58 days of age.   Your baby's skin or white part of his or her eyes is more yellow than it was in the hospital.   You feel depressed.  Document Released: 04/14/2005 Document Revised: 12/25/2010 Document Reviewed: 11/27/2008 [facility] Patient Information 2012 [place], [place].

## 2011-03-31 NOTE — Progress Notes (Signed)
U/S scheduled 04/03/11 at 245pm.

## 2011-04-03 ENCOUNTER — Ambulatory Visit (HOSPITAL_COMMUNITY)
Admission: RE | Admit: 2011-04-03 | Discharge: 2011-04-03 | Disposition: A | Discharge status: Home / Self Care | Payer: Medicaid Other | Source: Ambulatory Visit | Attending: Obstetrics & Gynecology | Admitting: Obstetrics & Gynecology

## 2011-04-03 DIAGNOSIS — O3660X0 Maternal care for excessive fetal growth, unspecified trimester, not applicable or unspecified: Secondary | ICD-10-CM | POA: Insufficient documentation

## 2011-04-03 DIAGNOSIS — O0990 Supervision of high risk pregnancy, unspecified, unspecified trimester: Secondary | ICD-10-CM

## 2011-04-03 DIAGNOSIS — O09299 Supervision of pregnancy with other poor reproductive or obstetric history, unspecified trimester: Secondary | ICD-10-CM | POA: Insufficient documentation

## 2011-04-03 DIAGNOSIS — O34219 Maternal care for unspecified type scar from previous cesarean delivery: Secondary | ICD-10-CM | POA: Insufficient documentation

## 2011-04-07 ENCOUNTER — Ambulatory Visit (INDEPENDENT_AMBULATORY_CARE_PROVIDER_SITE_OTHER): Payer: Medicaid Other | Admitting: Obstetrics and Gynecology

## 2011-04-07 DIAGNOSIS — O09299 Supervision of pregnancy with other poor reproductive or obstetric history, unspecified trimester: Secondary | ICD-10-CM

## 2011-04-07 DIAGNOSIS — O34219 Maternal care for unspecified type scar from previous cesarean delivery: Secondary | ICD-10-CM

## 2011-04-07 DIAGNOSIS — O0990 Supervision of high risk pregnancy, unspecified, unspecified trimester: Secondary | ICD-10-CM

## 2011-04-07 LAB — POCT URINALYSIS DIP (DEVICE)
Bilirubin Urine: NEGATIVE
Glucose, UA: NEGATIVE mg/dL
Ketones, ur: NEGATIVE mg/dL
Leukocytes, UA: NEGATIVE
Nitrite: NEGATIVE

## 2011-04-07 NOTE — Progress Notes (Signed)
Patient without complaints. Recent ultrasound demonstrates fetus in frank breech position. Patient declined ECV after counseling and desires repeat cesarean section. Patient will be scheduled at 39 weeks.

## 2011-04-07 NOTE — Progress Notes (Signed)
Addended by: Sherre Lain A on: 04/07/2011 10:07 AM   Modules accepted: Orders

## 2011-04-07 NOTE — Progress Notes (Signed)
Has been vomiting lately, feels fine otherwise, not related to meal times.

## 2011-04-07 NOTE — Progress Notes (Signed)
FM/PTL/preeclampsia precautions reviewed

## 2011-04-08 ENCOUNTER — Other Ambulatory Visit: Payer: Self-pay | Admitting: Obstetrics & Gynecology

## 2011-04-08 LAB — GC/CHLAMYDIA PROBE AMP, GENITAL
Chlamydia, DNA Probe: NEGATIVE
GC Probe Amp, Genital: NEGATIVE

## 2011-04-09 ENCOUNTER — Encounter (HOSPITAL_COMMUNITY): Payer: Self-pay

## 2011-04-10 LAB — CULTURE, BETA STREP (GROUP B ONLY)

## 2011-04-13 ENCOUNTER — Inpatient Hospital Stay (HOSPITAL_COMMUNITY)
Admission: AD | Admit: 2011-04-13 | Discharge: 2011-04-13 | Disposition: A | Discharge status: Home / Self Care | Payer: Medicaid Other | Source: Ambulatory Visit | Attending: Family Medicine | Admitting: Family Medicine

## 2011-04-13 ENCOUNTER — Encounter (HOSPITAL_COMMUNITY): Payer: Self-pay | Admitting: *Deleted

## 2011-04-13 DIAGNOSIS — N949 Unspecified condition associated with female genital organs and menstrual cycle: Secondary | ICD-10-CM | POA: Insufficient documentation

## 2011-04-13 DIAGNOSIS — O26899 Other specified pregnancy related conditions, unspecified trimester: Secondary | ICD-10-CM

## 2011-04-13 DIAGNOSIS — O09299 Supervision of pregnancy with other poor reproductive or obstetric history, unspecified trimester: Secondary | ICD-10-CM

## 2011-04-13 DIAGNOSIS — O169 Unspecified maternal hypertension, unspecified trimester: Secondary | ICD-10-CM

## 2011-04-13 DIAGNOSIS — O99891 Other specified diseases and conditions complicating pregnancy: Secondary | ICD-10-CM | POA: Insufficient documentation

## 2011-04-13 DIAGNOSIS — O34219 Maternal care for unspecified type scar from previous cesarean delivery: Secondary | ICD-10-CM

## 2011-04-13 LAB — URINALYSIS, ROUTINE W REFLEX MICROSCOPIC
Glucose, UA: NEGATIVE mg/dL
Hgb urine dipstick: NEGATIVE
Leukocytes, UA: NEGATIVE
Specific Gravity, Urine: <1.005 — ABNORMAL LOW (ref 1.005–1.030)

## 2011-04-13 MED ORDER — ZOLPIDEM TARTRATE 5 MG PO TABS: 5.0000 mg | ORAL_TABLET | Freq: Every evening | ORAL | Status: DC | PRN

## 2011-04-13 NOTE — Progress Notes (Signed)
Patient c/o pressure since yesterday hurts all the time, pain when urination, was breech.

## 2011-04-13 NOTE — Progress Notes (Signed)
Pt c/o of rectal and pelvic pressure for the past couple of days. Also c/o soreness in posterior gastroc muscle for the past 4 days.

## 2011-04-13 NOTE — ED Provider Notes (Signed)
History     Chief Complaint  Patient presents with  . Abdominal Pain   HPI Comments: Renee Suarez is a 20yo G2P010 at [redacted]w[redacted]d with PMH of severe preeclampsia in last pregnancy and gestational HTN and breech position in this pregnancy who presents with low pelvic pressure.  She is scheduled for caesarian on 12/28 for breech.   The pelvic pressure started 4 days ago.  She says that she didn't come in earlier because "everytime I come in, you send me home." Today she told her sister that when she went to the bathroom it felt like something was coming out, so her family brought her in.    C/o of some soreness in her left calf.  No swelling, no redness.   Notes irregular ctx. Denies loss of fluid or vaginal bleeding.   Abdominal Pain Pertinent negatives include no dysuria, fever, headaches, nausea or vomiting.      Past Medical History  Diagnosis Date  . Anemia   . Preterm labor   . GERD (gastroesophageal reflux disease)   . Seizures     eclampsia  . Hypertension   . Pregnancy induced hypertension     Past Surgical History  Procedure Date  . Cesarean section   . Cholecystectomy     Family History  Problem Relation Age of Onset  . Mental illness Brother   . Mental retardation Brother   . Hypertension Paternal Grandmother   . Diabetes Paternal Grandmother     History  Substance Use Topics  . Smoking status: Never Smoker   . Smokeless tobacco: Never Used  . Alcohol Use: No    Allergies: No Known Allergies  Prescriptions prior to admission  Medication Sig Dispense Refill  . acetaminophen (TYLENOL) 500 MG tablet Take 1,000 mg by mouth every 6 (six) hours as needed. Patient took medication for a headache.       . diphenhydramine-acetaminophen (TYLENOL PM) 25-500 MG TABS Take 2 tablets by mouth at bedtime as needed. sleep       . DISCONTD: diphenhydramine-acetaminophen (TYLENOL PM) 25-500 MG TABS Take 1 tablet by mouth at bedtime as needed (insomnia).  30 tablet  2     Review of Systems  Constitutional: Negative for fever and chills.  Eyes: Negative for blurred vision.  Respiratory: Negative for cough.   Cardiovascular: Negative for chest pain.  Gastrointestinal: Positive for abdominal pain. Negative for nausea and vomiting.  Genitourinary: Negative for dysuria.  Skin: Negative for rash.  Neurological: Negative for headaches.   Physical Exam   Blood pressure 131/78, pulse 93, temperature 99 F (37.2 C), resp. rate 18, height 5' (1.524 m), weight 94.167 kg (207 lb 9.6 oz).  Physical Exam  Constitutional: She is oriented to person, place, and time. She appears well-developed and well-nourished.  Cardiovascular: Normal rate and regular rhythm.   Respiratory: Effort normal and breath sounds normal. She has no wheezes.  GI:       Gravid, size appropriate for dates.  Neurological: She is alert and oriented to person, place, and time.  Skin: Skin is warm and dry.    MAU Course  Procedures   Assessment and Plan  20yo G2P0101 at [redacted]w[redacted]d who presents with low pelvic pressure.  -FHT: baseline 140, accelerations: present, decels: absent, ctx: irregular.  Cat 1 -cervix is closed -discussed signs of labor -reassurance regarding low back pain, pressure.  Renee Suarez for sleep difficulty -encouraged to keep appointment tomorrow  Discussed with Renee Suarez, CNM  Renee Suarez, Renee Suarez 04/13/2011, 3:48 PM

## 2011-04-17 ENCOUNTER — Encounter (HOSPITAL_COMMUNITY)
Admission: RE | Admit: 2011-04-17 | Discharge: 2011-04-17 | Payer: Medicaid Other | Source: Ambulatory Visit | Attending: Obstetrics & Gynecology | Admitting: Obstetrics & Gynecology

## 2011-04-17 ENCOUNTER — Other Ambulatory Visit: Payer: Self-pay | Admitting: Obstetrics & Gynecology

## 2011-04-17 ENCOUNTER — Encounter (HOSPITAL_COMMUNITY): Payer: Self-pay

## 2011-04-17 ENCOUNTER — Ambulatory Visit (HOSPITAL_COMMUNITY): Payer: Medicaid Other

## 2011-04-17 DIAGNOSIS — O34219 Maternal care for unspecified type scar from previous cesarean delivery: Secondary | ICD-10-CM

## 2011-04-17 DIAGNOSIS — O169 Unspecified maternal hypertension, unspecified trimester: Secondary | ICD-10-CM

## 2011-04-17 DIAGNOSIS — O09299 Supervision of pregnancy with other poor reproductive or obstetric history, unspecified trimester: Secondary | ICD-10-CM

## 2011-04-17 DIAGNOSIS — M79669 Pain in unspecified lower leg: Secondary | ICD-10-CM

## 2011-04-17 LAB — SURGICAL PCR SCREEN: Staphylococcus aureus: NEGATIVE

## 2011-04-17 LAB — CBC
MCV: 75.8 fL — ABNORMAL LOW (ref 78.0–100.0)
Platelets: 116 10*3/uL — ABNORMAL LOW (ref 150–400)
RDW: 14.7 % (ref 11.5–15.5)
WBC: 5 10*3/uL (ref 4.0–10.5)

## 2011-04-17 NOTE — Patient Instructions (Addendum)
YOUR PROCEDURE IS SCHEDULED ON:04/25/11  ENTER THROUGH THE MAIN ENTRANCE OF [facility] AT:9:15 am  USE DESK PHONE AND DIAL [id number] TO INFORM us OF YOUR ARRIVAL  CALL [phone] IF YOU HAVE ANY QUESTIONS OR PROBLEMS PRIOR TO YOUR ARRIVAL.  REMEMBER: DO NOT EAT OR DRINK AFTER MIDNIGHT : Thursday  SPECIAL INSTRUCTIONS: Hibiclens shower   YOU MAY BRUSH YOUR TEETH THE MORNING OF SURGERY   TAKE THESE MEDICINES THE DAY OF SURGERY WITH SIP OF WATER:none   DO NOT WEAR JEWELRY, EYE MAKEUP, LIPSTICK OR DARK FINGERNAIL POLISH DO NOT WEAR LOTIONS  DO NOT SHAVE FOR 48 HOURS PRIOR TO SURGERY  YOU WILL NOT BE ALLOWED TO DRIVE YOURSELF HOME.  NAME OF DRIVER: [place]- [phone]

## 2011-04-20 ENCOUNTER — Encounter (HOSPITAL_COMMUNITY): Payer: Self-pay | Admitting: Obstetrics and Gynecology

## 2011-04-20 ENCOUNTER — Inpatient Hospital Stay (HOSPITAL_COMMUNITY)
Admission: AD | Admit: 2011-04-20 | Discharge: 2011-04-20 | Disposition: A | Discharge status: Home / Self Care | Payer: Medicaid Other | Source: Ambulatory Visit | Attending: Obstetrics & Gynecology | Admitting: Obstetrics & Gynecology

## 2011-04-20 DIAGNOSIS — O479 False labor, unspecified: Secondary | ICD-10-CM | POA: Insufficient documentation

## 2011-04-20 DIAGNOSIS — O34219 Maternal care for unspecified type scar from previous cesarean delivery: Secondary | ICD-10-CM

## 2011-04-20 DIAGNOSIS — O09299 Supervision of pregnancy with other poor reproductive or obstetric history, unspecified trimester: Secondary | ICD-10-CM

## 2011-04-20 DIAGNOSIS — O169 Unspecified maternal hypertension, unspecified trimester: Secondary | ICD-10-CM

## 2011-04-20 NOTE — Progress Notes (Signed)
Dr. Marice Potter notified of patient arrival for labor eval. Repeat c-section, scheduled for Friday. Cervix closed, thick, posterior, soft.  Plan to watch her for 1 hr and recheck.  Fetal strip reactive at this time.

## 2011-04-20 NOTE — Progress Notes (Signed)
Contractions about 1 hour, no vaginal discharge, no bleeding, 39 weeks

## 2011-04-20 NOTE — Plan of Care (Signed)
T.O received from Marlynn Perking CNM to discharge patient home.

## 2011-04-20 NOTE — Progress Notes (Signed)
Pt presents to MAU with chief complaint of contractions that started 1 hr ago. Pt is a G2P1 at [redacted]w[redacted]d. Prior c-section; plans for c-section this pregnancy. Pt state she is unsure how often the contractions are coming.

## 2011-04-23 ENCOUNTER — Encounter: Payer: Self-pay | Admitting: Obstetrics and Gynecology

## 2011-04-24 MED ORDER — DEXTROSE 5 % IV SOLN
2.0000 g | INTRAVENOUS | Status: DC
  Filled 2011-04-24: qty 2

## 2011-04-25 ENCOUNTER — Encounter (HOSPITAL_COMMUNITY): Payer: Self-pay | Admitting: *Deleted

## 2011-04-25 ENCOUNTER — Encounter (HOSPITAL_COMMUNITY)
Admission: RE | Disposition: A | Discharge status: Home / Self Care | Payer: Self-pay | Source: Ambulatory Visit | Attending: Obstetrics & Gynecology

## 2011-04-25 ENCOUNTER — Encounter (HOSPITAL_COMMUNITY): Payer: Self-pay | Admitting: Anesthesiology

## 2011-04-25 ENCOUNTER — Inpatient Hospital Stay (HOSPITAL_COMMUNITY): Payer: Medicaid Other | Admitting: Anesthesiology

## 2011-04-25 ENCOUNTER — Inpatient Hospital Stay (HOSPITAL_COMMUNITY)
Admission: RE | Admit: 2011-04-25 | Discharge: 2011-04-28 | DRG: 766 | Disposition: A | Discharge status: Home / Self Care | Payer: Medicaid Other | Source: Ambulatory Visit | Attending: Obstetrics & Gynecology | Admitting: Obstetrics & Gynecology

## 2011-04-25 DIAGNOSIS — O34219 Maternal care for unspecified type scar from previous cesarean delivery: Secondary | ICD-10-CM

## 2011-04-25 DIAGNOSIS — Z01818 Encounter for other preprocedural examination: Secondary | ICD-10-CM

## 2011-04-25 DIAGNOSIS — Z01812 Encounter for preprocedural laboratory examination: Secondary | ICD-10-CM

## 2011-04-25 DIAGNOSIS — O09299 Supervision of pregnancy with other poor reproductive or obstetric history, unspecified trimester: Secondary | ICD-10-CM

## 2011-04-25 DIAGNOSIS — O169 Unspecified maternal hypertension, unspecified trimester: Secondary | ICD-10-CM

## 2011-04-25 DIAGNOSIS — M7989 Other specified soft tissue disorders: Secondary | ICD-10-CM

## 2011-04-25 SURGERY — Surgical Case
Anesthesia: Regional | Site: Abdomen

## 2011-04-25 MED ORDER — WITCH HAZEL-GLYCERIN EX PADS: 1.0000 "application " | MEDICATED_PAD | CUTANEOUS | Status: DC | PRN

## 2011-04-25 MED ORDER — MORPHINE SULFATE (PF) 0.5 MG/ML IJ SOLN
INTRAMUSCULAR | Status: DC | PRN
  Administered 2011-04-25: .2 mg via EPIDURAL

## 2011-04-25 MED ORDER — MEPERIDINE HCL 25 MG/ML IJ SOLN: 6.2500 mg | INTRAMUSCULAR | Status: DC | PRN

## 2011-04-25 MED ORDER — ONDANSETRON HCL 4 MG PO TABS: 4.0000 mg | ORAL_TABLET | ORAL | Status: DC | PRN

## 2011-04-25 MED ORDER — ONDANSETRON HCL 4 MG/2ML IJ SOLN: 4.0000 mg | Freq: Three times a day (TID) | INTRAMUSCULAR | Status: DC | PRN

## 2011-04-25 MED ORDER — LANOLIN HYDROUS EX OINT: 1.0000 "application " | TOPICAL_OINTMENT | CUTANEOUS | Status: DC | PRN

## 2011-04-25 MED ORDER — ATROPINE SULFATE 0.4 MG/ML IJ SOLN
INTRAMUSCULAR | Status: AC
  Filled 2011-04-25: qty 1

## 2011-04-25 MED ORDER — SIMETHICONE 80 MG PO CHEW: 80.0000 mg | CHEWABLE_TABLET | ORAL | Status: DC | PRN

## 2011-04-25 MED ORDER — KETOROLAC TROMETHAMINE 30 MG/ML IJ SOLN: 30.0000 mg | Freq: Four times a day (QID) | INTRAMUSCULAR | Status: AC | PRN

## 2011-04-25 MED ORDER — NALOXONE HCL 0.4 MG/ML IJ SOLN: 0.4000 mg | INTRAMUSCULAR | Status: DC | PRN

## 2011-04-25 MED ORDER — ONDANSETRON HCL 4 MG/2ML IJ SOLN
INTRAMUSCULAR | Status: AC
  Filled 2011-04-25: qty 2

## 2011-04-25 MED ORDER — PRENATAL MULTIVITAMIN CH
1.0000 | ORAL_TABLET | Freq: Every day | ORAL | Status: DC
  Administered 2011-04-26 – 2011-04-28 (×3): 1 via ORAL
  Filled 2011-04-25 (×3): qty 1

## 2011-04-25 MED ORDER — TETANUS-DIPHTH-ACELL PERTUSSIS 5-2.5-18.5 LF-MCG/0.5 IM SUSP
0.5000 mL | Freq: Once | INTRAMUSCULAR | Status: AC
  Administered 2011-04-26: 0.5 mL via INTRAMUSCULAR
  Filled 2011-04-25: qty 0.5

## 2011-04-25 MED ORDER — DIPHENHYDRAMINE HCL 25 MG PO CAPS
25.0000 mg | ORAL_CAPSULE | ORAL | Status: DC | PRN
  Administered 2011-04-26 – 2011-04-27 (×2): 25 mg via ORAL
  Filled 2011-04-25 (×2): qty 1

## 2011-04-25 MED ORDER — ZOLPIDEM TARTRATE 5 MG PO TABS: 5.0000 mg | ORAL_TABLET | Freq: Every evening | ORAL | Status: DC | PRN

## 2011-04-25 MED ORDER — NALBUPHINE HCL 10 MG/ML IJ SOLN
5.0000 mg | INTRAMUSCULAR | Status: DC | PRN
  Administered 2011-04-26: 10 mg via SUBCUTANEOUS

## 2011-04-25 MED ORDER — IBUPROFEN 600 MG PO TABS
600.0000 mg | ORAL_TABLET | Freq: Four times a day (QID) | ORAL | Status: DC | PRN
  Administered 2011-04-25: 600 mg via ORAL
  Filled 2011-04-25 (×8): qty 1

## 2011-04-25 MED ORDER — MENTHOL 3 MG MT LOZG: 1.0000 | LOZENGE | OROMUCOSAL | Status: DC | PRN

## 2011-04-25 MED ORDER — DIBUCAINE 1 % RE OINT: 1.0000 "application " | TOPICAL_OINTMENT | RECTAL | Status: DC | PRN

## 2011-04-25 MED ORDER — PHENYLEPHRINE HCL 10 MG/ML IJ SOLN
INTRAMUSCULAR | Status: DC | PRN
  Administered 2011-04-25: 40 ug via INTRAVENOUS
  Administered 2011-04-25 (×2): 80 ug via INTRAVENOUS

## 2011-04-25 MED ORDER — OXYCODONE-ACETAMINOPHEN 5-325 MG PO TABS
1.0000 | ORAL_TABLET | ORAL | Status: DC | PRN
  Administered 2011-04-26: 1 via ORAL
  Administered 2011-04-26 (×2): 2 via ORAL
  Administered 2011-04-26 – 2011-04-27 (×2): 1 via ORAL
  Administered 2011-04-27 – 2011-04-28 (×5): 2 via ORAL
  Filled 2011-04-25 (×3): qty 2
  Filled 2011-04-25 (×2): qty 1
  Filled 2011-04-25 (×2): qty 2
  Filled 2011-04-25: qty 1
  Filled 2011-04-25 (×3): qty 2

## 2011-04-25 MED ORDER — ATROPINE SULFATE 0.4 MG/ML IJ SOLN
INTRAMUSCULAR | Status: DC | PRN
  Administered 2011-04-25: 0.2 mg via INTRAVENOUS

## 2011-04-25 MED ORDER — FENTANYL CITRATE 0.05 MG/ML IJ SOLN
INTRAMUSCULAR | Status: AC
  Filled 2011-04-25: qty 2

## 2011-04-25 MED ORDER — LACTATED RINGERS IV SOLN
INTRAVENOUS | Status: DC
  Administered 2011-04-25: 21:00:00 via INTRAVENOUS

## 2011-04-25 MED ORDER — BUPIVACAINE HCL (PF) 0.25 % IJ SOLN
INTRAMUSCULAR | Status: DC | PRN
  Administered 2011-04-25: 30 mL

## 2011-04-25 MED ORDER — FENTANYL CITRATE 0.05 MG/ML IJ SOLN
INTRAMUSCULAR | Status: DC | PRN
  Administered 2011-04-25: 25 ug via INTRATHECAL

## 2011-04-25 MED ORDER — DEXTROSE 5 % IV SOLN
2.0000 g | INTRAVENOUS | Status: DC | PRN
  Administered 2011-04-25: 2 g via INTRAVENOUS

## 2011-04-25 MED ORDER — SCOPOLAMINE 1 MG/3DAYS TD PT72
1.0000 | MEDICATED_PATCH | Freq: Once | TRANSDERMAL | Status: DC
  Administered 2011-04-25: 1.5 mg via TRANSDERMAL

## 2011-04-25 MED ORDER — KETOROLAC TROMETHAMINE 30 MG/ML IJ SOLN
INTRAMUSCULAR | Status: AC
  Filled 2011-04-25: qty 1

## 2011-04-25 MED ORDER — KETOROLAC TROMETHAMINE 60 MG/2ML IM SOLN
INTRAMUSCULAR | Status: AC
  Filled 2011-04-25: qty 2

## 2011-04-25 MED ORDER — ENOXAPARIN SODIUM 40 MG/0.4ML ~~LOC~~ SOLN
40.0000 mg | SUBCUTANEOUS | Status: DC
  Filled 2011-04-25 (×2): qty 0.4

## 2011-04-25 MED ORDER — OXYTOCIN 10 UNIT/ML IJ SOLN
INTRAMUSCULAR | Status: AC
  Filled 2011-04-25: qty 2

## 2011-04-25 MED ORDER — BUPIVACAINE IN DEXTROSE 0.75-8.25 % IT SOLN
INTRATHECAL | Status: DC | PRN
  Administered 2011-04-25: 1.4 mL via INTRATHECAL

## 2011-04-25 MED ORDER — IBUPROFEN 600 MG PO TABS
600.0000 mg | ORAL_TABLET | Freq: Four times a day (QID) | ORAL | Status: DC
  Administered 2011-04-26 – 2011-04-28 (×10): 600 mg via ORAL
  Filled 2011-04-25 (×3): qty 1

## 2011-04-25 MED ORDER — ONDANSETRON HCL 4 MG/2ML IJ SOLN
INTRAMUSCULAR | Status: DC | PRN
  Administered 2011-04-25: 4 mg via INTRAVENOUS

## 2011-04-25 MED ORDER — PHENYLEPHRINE 40 MCG/ML (10ML) SYRINGE FOR IV PUSH (FOR BLOOD PRESSURE SUPPORT)
PREFILLED_SYRINGE | INTRAVENOUS | Status: AC
  Filled 2011-04-25: qty 5

## 2011-04-25 MED ORDER — KETOROLAC TROMETHAMINE 60 MG/2ML IM SOLN
60.0000 mg | Freq: Once | INTRAMUSCULAR | Status: AC | PRN
  Filled 2011-04-25: qty 2

## 2011-04-25 MED ORDER — DIPHENHYDRAMINE HCL 25 MG PO CAPS: 25.0000 mg | ORAL_CAPSULE | Freq: Four times a day (QID) | ORAL | Status: DC | PRN

## 2011-04-25 MED ORDER — NALBUPHINE HCL 10 MG/ML IJ SOLN
5.0000 mg | INTRAMUSCULAR | Status: DC | PRN
  Administered 2011-04-25: 5 mg via INTRAVENOUS
  Administered 2011-04-25: 10 mg via INTRAVENOUS
  Filled 2011-04-25 (×3): qty 1

## 2011-04-25 MED ORDER — SODIUM CHLORIDE 0.9 % IJ SOLN: 3.0000 mL | INTRAMUSCULAR | Status: DC | PRN

## 2011-04-25 MED ORDER — PROMETHAZINE HCL 25 MG/ML IJ SOLN: 6.2500 mg | INTRAMUSCULAR | Status: DC | PRN

## 2011-04-25 MED ORDER — FENTANYL CITRATE 0.05 MG/ML IJ SOLN
INTRAMUSCULAR | Status: DC | PRN
  Administered 2011-04-25: 50 ug via INTRAVENOUS
  Administered 2011-04-25: 25 ug via INTRAVENOUS

## 2011-04-25 MED ORDER — MORPHINE SULFATE 0.5 MG/ML IJ SOLN
INTRAMUSCULAR | Status: AC
  Filled 2011-04-25: qty 10

## 2011-04-25 MED ORDER — HYDROMORPHONE HCL PF 1 MG/ML IJ SOLN
INTRAMUSCULAR | Status: AC
  Administered 2011-04-25: 0.5 mg via INTRAVENOUS
  Filled 2011-04-25: qty 1

## 2011-04-25 MED ORDER — LACTATED RINGERS IV SOLN
INTRAVENOUS | Status: DC
  Administered 2011-04-25 (×2): via INTRAVENOUS

## 2011-04-25 MED ORDER — KETOROLAC TROMETHAMINE 30 MG/ML IJ SOLN
15.0000 mg | Freq: Once | INTRAMUSCULAR | Status: AC | PRN
  Administered 2011-04-25: 30 mg via INTRAVENOUS

## 2011-04-25 MED ORDER — ONDANSETRON HCL 4 MG/2ML IJ SOLN: 4.0000 mg | INTRAMUSCULAR | Status: DC | PRN

## 2011-04-25 MED ORDER — OXYTOCIN 20 UNITS IN LACTATED RINGERS INFUSION - SIMPLE
INTRAVENOUS | Status: AC
  Administered 2011-04-25: 125 mL/h via INTRAVENOUS
  Filled 2011-04-25: qty 1000

## 2011-04-25 MED ORDER — OXYTOCIN 20 UNITS IN LACTATED RINGERS INFUSION - SIMPLE
INTRAVENOUS | Status: DC | PRN
  Administered 2011-04-25: 20 [IU] via INTRAVENOUS

## 2011-04-25 MED ORDER — SENNOSIDES-DOCUSATE SODIUM 8.6-50 MG PO TABS
2.0000 | ORAL_TABLET | Freq: Every day | ORAL | Status: DC
  Administered 2011-04-25 – 2011-04-27 (×3): 2 via ORAL

## 2011-04-25 MED ORDER — HYDROMORPHONE HCL PF 1 MG/ML IJ SOLN
0.2500 mg | INTRAMUSCULAR | Status: DC | PRN
  Administered 2011-04-25 (×2): 0.5 mg via INTRAVENOUS

## 2011-04-25 MED ORDER — SCOPOLAMINE 1 MG/3DAYS TD PT72
MEDICATED_PATCH | TRANSDERMAL | Status: AC
  Administered 2011-04-25: 1.5 mg via TRANSDERMAL
  Filled 2011-04-25: qty 1

## 2011-04-25 MED ORDER — SODIUM CHLORIDE 0.9 % IV SOLN
1.0000 ug/kg/h | INTRAVENOUS | Status: DC | PRN
  Filled 2011-04-25: qty 2.5

## 2011-04-25 MED ORDER — DIPHENHYDRAMINE HCL 50 MG/ML IJ SOLN: 12.5000 mg | INTRAMUSCULAR | Status: DC | PRN

## 2011-04-25 MED ORDER — DIPHENHYDRAMINE HCL 50 MG/ML IJ SOLN: 25.0000 mg | INTRAMUSCULAR | Status: DC | PRN

## 2011-04-25 MED ORDER — OXYTOCIN 20 UNITS IN LACTATED RINGERS INFUSION - SIMPLE
125.0000 mL/h | INTRAVENOUS | Status: AC
  Administered 2011-04-25: 125 mL/h via INTRAVENOUS

## 2011-04-25 MED ORDER — SIMETHICONE 80 MG PO CHEW
80.0000 mg | CHEWABLE_TABLET | Freq: Three times a day (TID) | ORAL | Status: DC
  Administered 2011-04-25 – 2011-04-27 (×7): 80 mg via ORAL

## 2011-04-25 SURGICAL SUPPLY — 37 items
APL SKNCLS STERI-STRIP NONHPOA (GAUZE/BANDAGES/DRESSINGS) ×1
BENZOIN TINCTURE PRP APPL 2/3 (GAUZE/BANDAGES/DRESSINGS) ×1 IMPLANT
CHLORAPREP W/TINT 26ML (MISCELLANEOUS) ×2 IMPLANT
CLOTH BEACON ORANGE TIMEOUT ST (SAFETY) ×2 IMPLANT
DRESSING TELFA 8X3 (GAUZE/BANDAGES/DRESSINGS) ×3 IMPLANT
DRSG PAD ABDOMINAL 8X10 ST (GAUZE/BANDAGES/DRESSINGS) ×1 IMPLANT
ELECT REM PT RETURN 9FT ADLT (ELECTROSURGICAL) ×2
ELECTRODE REM PT RTRN 9FT ADLT (ELECTROSURGICAL) ×1 IMPLANT
EXTRACTOR VACUUM M CUP 4 TUBE (SUCTIONS) IMPLANT
GAUZE SPONGE 4X4 12PLY STRL LF (GAUZE/BANDAGES/DRESSINGS) ×4 IMPLANT
GLOVE BIO SURGEON STRL SZ7 (GLOVE) ×2 IMPLANT
GLOVE BIOGEL PI IND STRL 7.0 (GLOVE) ×2 IMPLANT
GLOVE BIOGEL PI INDICATOR 7.0 (GLOVE) ×2
GOWN PREVENTION PLUS LG XLONG (DISPOSABLE) ×6 IMPLANT
KIT ABG SYR 3ML LUER SLIP (SYRINGE) IMPLANT
NDL HYPO 25X5/8 SAFETYGLIDE (NEEDLE) ×1 IMPLANT
NEEDLE HYPO 22GX1.5 SAFETY (NEEDLE) ×2 IMPLANT
NEEDLE HYPO 25X5/8 SAFETYGLIDE (NEEDLE) ×2 IMPLANT
NS IRRIG 1000ML POUR BTL (IV SOLUTION) ×2 IMPLANT
PACK C SECTION WH (CUSTOM PROCEDURE TRAY) ×2 IMPLANT
PAD ABD 7.5X8 STRL (GAUZE/BANDAGES/DRESSINGS) IMPLANT
RTRCTR C-SECT PINK 25CM LRG (MISCELLANEOUS) IMPLANT
SLEEVE SCD COMPRESS KNEE LRG (MISCELLANEOUS) IMPLANT
SLEEVE SCD COMPRESS KNEE MED (MISCELLANEOUS) IMPLANT
STAPLER VISISTAT 35W (STAPLE) IMPLANT
STRIP CLOSURE SKIN 1/2X4 (GAUZE/BANDAGES/DRESSINGS) ×1 IMPLANT
SUT MNCRL 0 VIOLET CTX 36 (SUTURE) ×2 IMPLANT
SUT MONOCRYL 0 CTX 36 (SUTURE) ×2
SUT PDS AB 0 CT1 27 (SUTURE) IMPLANT
SUT VIC AB 0 CT1 36 (SUTURE) ×4 IMPLANT
SUT VIC AB 2-0 CT1 27 (SUTURE)
SUT VIC AB 2-0 CT1 TAPERPNT 27 (SUTURE) IMPLANT
SUT VIC AB 4-0 KS 27 (SUTURE) ×2 IMPLANT
SYR CONTROL 10ML LL (SYRINGE) ×2 IMPLANT
TOWEL OR 17X24 6PK STRL BLUE (TOWEL DISPOSABLE) ×4 IMPLANT
TRAY FOLEY CATH 14FR (SET/KITS/TRAYS/PACK) ×2 IMPLANT
WATER STERILE IRR 1000ML POUR (IV SOLUTION) ×2 IMPLANT

## 2011-04-25 NOTE — Progress Notes (Addendum)
Patient reports LLE sweling and pain that is intermittent, no current symptoms, benign exam today.  Will use SCDs during her surgery, plan for LLE dopplers after surgery. May need prophylactic anticoagulation if indicated after surgery.  Jaynie Collins, M.D. 04/25/2011 10:32 AM

## 2011-04-25 NOTE — H&P (Signed)
Renee Suarez is a 20 y.o. female G2P0101 with IUP at [redacted]w[redacted]d presenting for elective repeat cesarean section. Pt states she has been having no contractions, no vaginal bleeding, intact membranes, with active fetal movement.  Prenatal Course Source of Care: [facility]  with onset of care at 15 weeks Pregnancy complications or risks:  Patient Active Problem List  Diagnoses  . Previous cesarean delivery, antepartum condition or complication  . Prior pregnancy complicated by eclampsia, antepartum  . Pregnancy, supervision, high-risk   She desires to IUD.  She plans to breastfeed  Prenatal labs and studies: ABO, Rh: --/--/A positive (07/19 0827) Antibody: negative (07/19 0829) Rubella:  immune RPR: NON REACTIVE (12/20 1050)  HBsAg:   neg HIV: NON REACTIVE (10/08 1006)  GBS:   neg 1 hr Glucola 88 Genetic screeningnormal Anatomy US normal  Past Medical History:  Past Medical History  Diagnosis Date  . Anemia   . Preterm labor   . Hypertension   . Pregnancy induced hypertension   . Seizures     eclampsia with first pregnancy  . GERD (gastroesophageal reflux disease)     post partum 1st preg.    Past Surgical History:  Past Surgical History  Procedure Date  . Cesarean section   . Cholecystectomy     Obstetrical History:  OB History    Grav Para Term Preterm Abortions TAB SAB Ect Mult Living   2 1 0 1 0 0 0 0 0 1      Social History:  History   Social History  . Marital Status: Single    Spouse Name: N/A    Number of Children: N/A  . Years of Education: N/A   Social History Main Topics  . Smoking status: Never Smoker   . Smokeless tobacco: Never Used  . Alcohol Use: No  . Drug Use: No  . Sexually Active: Yes    Birth Control/ Protection: None   Other Topics Concern  . None   Social History Narrative  . None    Family History:  Family History  Problem Relation Age of Onset  . Mental illness Brother   . Mental retardation Brother   .  Hypertension Paternal Grandmother   . Diabetes Paternal Grandmother     Medications:  Prenatal vitamins,  Current Facility-Administered Medications  Medication Dose Route Frequency Provider Last Rate Last Dose  . cefoTEtan (CEFOTAN) 2 g in dextrose 5 % 50 mL IVPB  2 g Intravenous On Call to OR Elmarie Devlin A Aron Needles, MD      . lactated ringers infusion   Intravenous Continuous Tereso Newcomer, MD 125 mL/hr at 04/25/11 0958    . scopolamine (TRANSDERM-SCOP) 1.5 MG 1.5 mg  1 patch Transdermal Once Velna Hatchet, MD      . scopolamine (TRANSDERM-SCOP) 1.5 MG             Allergies: No Known Allergies  Review of Systems: - Negative   Physical Exam: Blood pressure 131/78, pulse 77, temperature 98.2 F (36.8 C), temperature source Oral, resp. rate 20, SpO2 99.00%. FHR 130s. GENERAL: Well-developed, well-nourished female in no acute distress.  LUNGS: Clear to auscultation bilaterally.  HEART: Regular rate and rhythm. ABDOMEN: Soft, nontender, nondistended, gravid. Well healed Pfannensteil incision EXTREMITIES: Nontender, no edema, 2+ distal pulses.   Pertinent Labs/Studies:   Assessment : Renee Suarez is a 20 y.o. G2P0101 at [redacted]w[redacted]d being admitted for scheduled repeat cesarean section  Plan: Cesarean section.  The risks of cesarean section  discussed with the patient included but were not limited to: bleeding which may require transfusion or reoperation; infection which may require antibiotics; injury to bowel, bladder, ureters or other surrounding organs; injury to the fetus; need for additional procedures including hysterectomy in the event of a life-threatening hemorrhage; placental abnormalities wth subsequent pregnancies, incisional problems, thromboembolic phenomenon and other postoperative/anesthesia complications. The patient concurred with the proposed plan, giving informed written consent for the procedure. Anesthesia and OR aware.  Preoperative prophylactic antibiotics ordered on  call to the OR.  To OR when ready  STINSON, JACOB JEHIEL 04/25/2011, 9:59 AM

## 2011-04-25 NOTE — Progress Notes (Signed)
Dr. Arby Barrette notified of plt count 116 on 12/20.  Okay to give Ketorolac 30 mg IM in PACU.

## 2011-04-25 NOTE — Anesthesia Postprocedure Evaluation (Signed)
  Anesthesia Post-op Note  Patient: Renee Suarez  Procedure(s) Performed:  CESAREAN SECTION - Repeat C/Section  Patient Location: 143  Anesthesia Type: Spinal  Level of Consciousness: alert  and oriented  Airway and Oxygen Therapy: Patient Spontanous Breathing  Post-op Pain: mild  Post-op Assessment: Patient's Cardiovascular Status Stable and Respiratory Function Stable  Post-op Vital Signs: stable  Complications: No apparent anesthesia complications

## 2011-04-25 NOTE — Addendum Note (Signed)
Addendum  created 04/25/11 1625 by Fanny Dance   Modules edited:Notes Section

## 2011-04-25 NOTE — Progress Notes (Signed)
Spoke with [place] with vascular [id number] duplex ordered stat post-op.  Stated she is finishing with another pt and will come to PACU when she is done.

## 2011-04-25 NOTE — Op Note (Addendum)
Renee Suarez PROCEDURE DATE: 04/25/2011  PREOPERATIVE DIAGNOSIS: Intrauterine pregnancy at  [redacted]w[redacted]d weeks gestation; elective repeat cesarean section  POSTOPERATIVE DIAGNOSIS: The same  PROCEDURE: Repeat Low Transverse Cesarean Section  SURGEON:  Dr. Macon Large  ASSISTANT: Dr Candelaria Celeste  INDICATIONS: Renee Suarez is a 20 y.o. G2P0101 at [redacted]w[redacted]d scheduled for cesarean section secondary to Elective repeat.  The risks of cesarean section discussed with the patient included but were not limited to: bleeding which may require transfusion or reoperation; infection which may require antibiotics; injury to bowel, bladder, ureters or other surrounding organs; injury to the fetus; need for additional procedures including hysterectomy in the event of a life-threatening hemorrhage; placental abnormalities wth subsequent pregnancies, incisional problems, thromboembolic phenomenon and other postoperative/anesthesia complications. The patient concurred with the proposed plan, giving informed written consent for the procedure.    FINDINGS:  Viable female infant in cephalic presentation.  Apgars 8 and 9, weight, 7 pounds and 0 ounces.  Clear amniotic fluid.  Intact placenta, three vessel cord.  Normal uterus, fallopian tubes and ovaries bilaterally.  ANESTHESIA:    Spinal INTRAVENOUS FLUIDS:1900 ml ESTIMATED BLOOD LOSS: 800 ml URINE OUTPUT:  175 ml, clear SPECIMENS: Placenta sent to L&D COMPLICATIONS: None immediate  PROCEDURE IN DETAIL:  The patient received intravenous antibiotics and had sequential compression devices applied to her lower extremities while in the preoperative area.  She was then taken to the operating room where spinal anesthesia was administered and was found to be adequate. She was then placed in a dorsal supine position with a leftward tilt, and prepped and draped in a sterile manner.  A foley catheter was placed into her bladder and attached to constant gravity, which drained clear  fluid throughout.  After an adequate timeout was performed, a Pfannenstiel skin incision was made with scalpel and carried through to the underlying layer of fascia. The fascia was incised in the midline and this incision was extended bilaterally using the Mayo scissors. Kocher clamps were applied to the superior aspect of the fascial incision and the underlying rectus muscles were dissected off bluntly. A similar process was carried out on the inferior aspect of the facial incision. The rectus muscles were separated in the midline bluntly and the peritoneum was entered bluntly. To gain better access, the rectus muscles were incised with cautery by approximately 1 cm bilaterally.  Attention was turned to the lower uterine segment where a bladder flap was made and the bladder blade inserted.  A transverse hysterotomy was then made with a scalpel and extended bilaterally bluntly and sharply with bandage scissors. The bladder blade was then removed. The infant was successfully delivered, and cord was clamped and cut and infant was handed over to awaiting neonatology team. Uterine massage was then administered and the placenta delivered intact with three-vessel cord. The uterus was then cleared of clot and debris.  The hysterotomy was closed with 0 Monocryl in a running locked fashion, and an imbricating layer was also placed with a 0 Monocryl. Overall, excellent hemostasis was noted. The abdomen and the pelvis were cleared of all clot and debris. Hemostasis was confirmed on all surfaces.  The muscles were reapproximated using 2-0 vicryl single stitch. The fascia was then closed using 0 Vicryl in a running fashion.  The skin was then closed with 4-0 vicryl. The patient tolerated the procedure well. Sponge, lap, instrument and needle counts were correct x 2. She was taken to the recovery room in stable condition.    STINSON, JACOB  JEHIEL DO 04/25/2011 12:10 PM

## 2011-04-25 NOTE — Progress Notes (Signed)
*  PRELIMINARY RESULTS* Left:  No evidence of DVT or Baker's cyst.  Renee Suarez, [place] D 04/25/2011, 1:16 PM

## 2011-04-25 NOTE — Transfer of Care (Signed)
Immediate Anesthesia Transfer of Care Note  Patient: Renee Suarez  Procedure(s) Performed:  CESAREAN SECTION - Repeat C/Section  Patient Location: PACU  Anesthesia Type: Spinal  Level of Consciousness: awake and oriented  Airway & Oxygen Therapy: Patient Spontanous Breathing  Post-op Assessment: Report given to PACU RN and Post -op Vital signs reviewed and stable  Post vital signs: Reviewed and stable  Complications: No apparent anesthesia complications

## 2011-04-25 NOTE — Anesthesia Procedure Notes (Signed)
Spinal  Patient location during procedure: OR Start time: 04/25/2011 11:02 AM End time: 04/25/2011 11:07 AM Staffing Anesthesiologist: Sandrea Hughs Performed by: anesthesiologist  Preanesthetic Checklist Completed: patient identified, site marked, surgical consent, pre-op evaluation, timeout performed, IV checked, risks and benefits discussed and monitors and equipment checked Spinal Block Patient position: sitting Prep: DuraPrep Patient monitoring: heart rate, cardiac monitor, continuous pulse ox and blood pressure Approach: midline Location: L3-4 Injection technique: single-shot Needle Needle type: Sprotte  Needle gauge: 24 G Needle length: 9 cm Needle insertion depth: 7 cm Assessment Sensory level: T4

## 2011-04-25 NOTE — Consult Note (Signed)
Neonatology Note:  Attendance at C-section:  I was asked to attend this repeat C/S at term. The mother is a G2P1 A pos, GBS neg. ROM at delivery, fluid clear. Infant vigorous with good spontaneous cry and tone. Needed only minimal bulb suctioning. Ap 8/9. Lungs clear to ausc in DR. To CN to care of Pediatrician.  Kristjan Derner, MD  

## 2011-04-25 NOTE — Anesthesia Postprocedure Evaluation (Signed)
Anesthesia Post Note  Patient: Renee Suarez  Procedure(s) Performed:  CESAREAN SECTION - Repeat C/Section  Anesthesia type: Spinal  Patient location: PACU  Post pain: Pain level controlled  Post assessment: Post-op Vital signs reviewed  Last Vitals:  Filed Vitals:   04/25/11 1330  BP: 118/63  Pulse: 68  Temp:   Resp: 24    Post vital signs: Reviewed  Level of consciousness: awake  Complications: No apparent anesthesia complications

## 2011-04-25 NOTE — Anesthesia Preprocedure Evaluation (Signed)
Anesthesia Evaluation  Patient identified by MRN, date of birth, ID band Patient awake    Reviewed: Allergy & Precautions, H&P , NPO status , Patient's Chart, lab work & pertinent test results  Airway Mallampati: II TM Distance: >3 FB Neck ROM: full    Dental No notable dental hx.    Pulmonary neg pulmonary ROS,    Pulmonary exam normal       Cardiovascular hypertension,     Neuro/Psych Negative Neurological ROS  Negative Psych ROS   GI/Hepatic Neg liver ROS, GERD-  ,  Endo/Other  Negative Endocrine ROSMorbid obesity  Renal/GU negative Renal ROS  Genitourinary negative   Musculoskeletal negative musculoskeletal ROS (+)   Abdominal (+) obese,   Peds negative pediatric ROS (+)  Hematology negative hematology ROS (+)   Anesthesia Other Findings   Reproductive/Obstetrics (+) Pregnancy                           Anesthesia Physical Anesthesia Plan  ASA: III  Anesthesia Plan: Spinal   Post-op Pain Management:    Induction:   Airway Management Planned:   Additional Equipment:   Intra-op Plan:   Post-operative Plan:   Informed Consent: I have reviewed the patients History and Physical, chart, labs and discussed the procedure including the risks, benefits and alternatives for the proposed anesthesia with the patient or authorized representative who has indicated his/her understanding and acceptance.     Plan Discussed with: CRNA  Anesthesia Plan Comments:         Anesthesia Quick Evaluation

## 2011-04-26 LAB — CBC
MCH: 24.9 pg — ABNORMAL LOW (ref 26.0–34.0)
Platelets: 103 10*3/uL — ABNORMAL LOW (ref 150–400)
RBC: 3.37 MIL/uL — ABNORMAL LOW (ref 3.87–5.11)

## 2011-04-26 NOTE — Progress Notes (Signed)
Subjective: Postpartum Day #1: Cesarean Delivery Patient reports tolerating PO and no problems voiding; ambulating without difficulty; breastfeeding going well; desires Mirena   Objective: Vital signs in last 24 hours: Temp:  [97.4 F (36.3 C)-99.1 F (37.3 C)] 98.5 F (36.9 C) (12/29 1030) Pulse Rate:  [64-102] 88  (12/29 1030) Resp:  [16-24] 18  (12/29 1030) BP: (102-147)/(62-100) 108/66 mmHg (12/29 1030) SpO2:  [96 %-100 %] 97 % (12/29 1030) Weight:  [94.433 kg (208 lb 3 oz)] 208 lb 3 oz (94.433 kg) (12/28 1428)  Physical Exam:  General: alert, cooperative and mild distress Lochia: appropriate Uterine Fundus: firm Incision: healing well, dressing CDI DVT Evaluation: No evidence of DVT seen on physical exam.   Basename 04/26/11 0504  HGB 8.4*  HCT 25.6*    Assessment/Plan: Status post Cesarean section. Doing well postoperatively.  Continue current care.  Cam Hai 04/26/2011, 12:23 PM

## 2011-04-27 MED ORDER — PRENATAL MULTIVITAMIN CH: 1.0000 | ORAL_TABLET | Freq: Every day | ORAL | Status: DC

## 2011-04-27 MED ORDER — OXYCODONE-ACETAMINOPHEN 5-325 MG PO TABS: 1.0000 | ORAL_TABLET | ORAL | Status: DC | PRN

## 2011-04-27 MED ORDER — IBUPROFEN 600 MG PO TABS: 600.0000 mg | ORAL_TABLET | Freq: Four times a day (QID) | ORAL | Status: AC | PRN

## 2011-04-27 MED ORDER — SENNOSIDES-DOCUSATE SODIUM 8.6-50 MG PO TABS: 2.0000 | ORAL_TABLET | Freq: Every day | ORAL | Status: DC

## 2011-04-27 MED ORDER — DIPHENHYDRAMINE HCL 25 MG PO CAPS: 25.0000 mg | ORAL_CAPSULE | ORAL | Status: DC | PRN

## 2011-04-27 NOTE — Discharge Summary (Signed)
Obstetric Discharge Summary Reason for Admission: cesarean section Prenatal Procedures: none Intrapartum Procedures: cesarean: low cervical, transverse Postpartum Procedures: none Complications-Operative and Postpartum: none Hemoglobin  Date Value Range Status  04/26/2011 8.4* 12.0-15.0 (g/dL) Final     HCT  Date Value Range Status  04/26/2011 25.6* 36.0-46.0 (%) Final  10/09/2010 36   Final     performed at [facility]    Discharge Diagnoses: Term Pregnancy-delivered  Discharge Information: Date: 04/27/2011 Activity: pelvic rest Diet: routine Medications: PNV, Ibuprofen, Colace and Percocet Condition: stable Instructions: refer to practice specific booklet Discharge to: home Follow-up Information    Follow up with [facility] [place] in 6 weeks. (for postpartum visit)    Contact information:   [address] [place] [address]          Newborn Data: Live born female  Birth Weight: 7 lb 0.4 oz (3185 g) APGAR: 8, 9  Home with mother.  STINSON, JACOB JEHIEL 04/27/2011, 10:44 AM

## 2011-04-28 ENCOUNTER — Encounter (HOSPITAL_COMMUNITY): Payer: Self-pay | Admitting: Obstetrics & Gynecology

## 2011-04-28 MED ORDER — HYDROCORTISONE 1 % EX CREA
TOPICAL_CREAM | Freq: Two times a day (BID) | CUTANEOUS | Status: DC
  Filled 2011-04-28: qty 28

## 2011-04-28 MED ORDER — HYDROCORTISONE 1 % EX CREA: TOPICAL_CREAM | Freq: Two times a day (BID) | CUTANEOUS | Status: DC

## 2011-04-28 NOTE — Progress Notes (Signed)
UR chart review completed.  

## 2011-04-28 NOTE — Discharge Summary (Signed)
Obstetric Discharge Summary Reason for Admission: onset of labor and breech presentation Prenatal Procedures: none Intrapartum Procedures: cesarean: low cervical, transverse Postpartum Procedures: none Complications-Operative and Postpartum: none Hemoglobin  Date Value Range Status  04/26/2011 8.4* 12.0-15.0 (g/dL) Final     HCT  Date Value Range Status  04/26/2011 25.6* 36.0-46.0 (%) Final  10/09/2010 36   Final     performed at [facility]    Discharge Diagnoses: Term Pregnancy-delivered  Discharge Information: Date: 04/28/2011 Activity: pelvic rest and no heavy lifting Diet: routine Medications: Ibuprofen and Percocet Condition: stable Instructions: refer to practice specific booklet Discharge to: home Follow-up Information    Follow up with [facility] [place] in 6 weeks. (for postpartum visit)    Contact information:   [address] [place] [address]          Newborn Data: Live born female  Birth Weight: 7 lb 0.4 oz (3185 g) APGAR: 8, 9  Home with mother.  Renee Suarez 04/28/2011, 9:00 AM

## 2011-04-28 NOTE — Progress Notes (Signed)
Subjective: Postpartum Day 3: Cesarean Delivery Patient reports incisional pain, tolerating PO, + flatus and no problems voiding.    Objective: Vital signs in last 24 hours: Temp:  [97.8 F (36.6 C)-98.4 F (36.9 C)] 97.8 F (36.6 C) (12/31 0520) Pulse Rate:  [89-91] 89  (12/31 0520) Resp:  [18] 18  (12/31 0520) BP: (104-114)/(73-79) 114/79 mmHg (12/31 0520)  Physical Exam:  General: alert, cooperative and no distress Lochia: appropriate Uterine Fundus: firm Incision: no significant drainage, no significant erythema, no induration, steri-strips intact DVT Evaluation: No evidence of DVT seen on physical exam. No cords or calf tenderness. No significant calf/ankle edema.   Basename 04/26/11 0504  HGB 8.4*  HCT 25.6*    Assessment/Plan: Status post Cesarean section. Doing well postoperatively.  Discharge home with standard precautions and return to clinic in 4-6 weeks.  Aloha Bartok 04/28/2011, 8:56 AM

## 2011-04-30 ENCOUNTER — Encounter: Payer: Self-pay | Admitting: Obstetrics and Gynecology

## 2011-04-30 NOTE — Op Note (Signed)
Attestation of Attending Supervision of Advanced Practitioner: Evaluation and management procedures were performed by the PA/NP/CNM/OB Fellow under my supervision/collaboration. Chart reviewed, and agree with management and plan.  Jaynie Collins, M.D. 04/30/2011 12:41 PM

## 2011-05-02 ENCOUNTER — Inpatient Hospital Stay (HOSPITAL_COMMUNITY)
Admission: AD | Admit: 2011-05-02 | Discharge: 2011-05-03 | Discharge status: Against Medical Advice | Payer: Medicaid Other | Source: Ambulatory Visit | Attending: Obstetrics & Gynecology | Admitting: Obstetrics & Gynecology

## 2011-05-02 ENCOUNTER — Inpatient Hospital Stay (HOSPITAL_COMMUNITY)
Admission: AD | Admit: 2011-05-02 | Discharge: 2011-05-02 | Disposition: A | Discharge status: Home / Self Care | Payer: Medicaid Other | Source: Ambulatory Visit | Attending: Obstetrics & Gynecology | Admitting: Obstetrics & Gynecology

## 2011-05-02 DIAGNOSIS — L293 Anogenital pruritus, unspecified: Secondary | ICD-10-CM | POA: Insufficient documentation

## 2011-05-02 DIAGNOSIS — Z5320 Procedure and treatment not carried out because of patient's decision for unspecified reasons: Secondary | ICD-10-CM | POA: Insufficient documentation

## 2011-05-02 NOTE — Progress Notes (Signed)
Pt states, " I've been itching since I was in the hospital right after my spinal. I have a rash on my stomach, back and between my legs. I'm having itching around my vagina."

## 2011-05-02 NOTE — ED Notes (Signed)
Patient not in lobby will discharge.

## 2011-05-02 NOTE — Plan of Care (Signed)
Not in lobby

## 2011-05-02 NOTE — Plan of Care (Signed)
Pt signed in at 1603 and not in lobby at 1608 when registration called her. Not in lobby at 1650

## 2011-05-03 ENCOUNTER — Inpatient Hospital Stay (HOSPITAL_COMMUNITY)
Admission: AD | Admit: 2011-05-03 | Discharge: 2011-05-03 | Disposition: A | Discharge status: Home / Self Care | Payer: Medicaid Other | Source: Ambulatory Visit | Attending: Family Medicine | Admitting: Family Medicine

## 2011-05-03 ENCOUNTER — Encounter (HOSPITAL_COMMUNITY): Payer: Self-pay | Admitting: Obstetrics and Gynecology

## 2011-05-03 DIAGNOSIS — Z888 Allergy status to other drugs, medicaments and biological substances status: Secondary | ICD-10-CM

## 2011-05-03 DIAGNOSIS — O99893 Other specified diseases and conditions complicating puerperium: Secondary | ICD-10-CM | POA: Insufficient documentation

## 2011-05-03 DIAGNOSIS — T40605A Adverse effect of unspecified narcotics, initial encounter: Secondary | ICD-10-CM | POA: Insufficient documentation

## 2011-05-03 DIAGNOSIS — T7840XA Allergy, unspecified, initial encounter: Secondary | ICD-10-CM

## 2011-05-03 MED ORDER — HYDROXYZINE HCL 50 MG PO TABS
50.0000 mg | ORAL_TABLET | Freq: Once | ORAL | Status: AC
  Administered 2011-05-03: 50 mg via ORAL
  Filled 2011-05-03: qty 1

## 2011-05-03 MED ORDER — HYDROXYZINE HCL 50 MG PO TABS
25.0000 mg | ORAL_TABLET | ORAL | Status: AC | PRN
Start: 2011-05-03 — End: 2011-05-13

## 2011-05-03 NOTE — ED Provider Notes (Signed)
Chart reviewed and agree with management and plan.  

## 2011-05-03 NOTE — Progress Notes (Signed)
Patient is not in lobby 

## 2011-05-03 NOTE — Progress Notes (Signed)
Pt reports having "break out on her face, vaginal area and abd". Had c-section 1 week ago and started having break out before discharge. Tod it was reaction to spinal given hydrocortisone cream but it is not helping along with benedryl.

## 2011-05-03 NOTE — Progress Notes (Signed)
Pt presents to [facility] with chief complaint of rash. Pt is post op day 8; repeat c-section. Pt says the rash started right after surgery and she has been itching ever since. The rash is mainly between her legs and on her bottom. Pt has tried benadryl and hydrocortisone cream; no relief.

## 2011-05-03 NOTE — ED Notes (Signed)
Called not in lobby.

## 2011-05-03 NOTE — ED Provider Notes (Signed)
History   Ms Dingus is a 21yo [id number] s/p C/S x 8d who presents for eval of rash/itching that has been going on since her hospitalization. She reports it mainly on her abd, groin, lower back and her eyes. Denies difficulty breathing or SOB. No fever or other postpartum concerns. She is breastfeeding. She has been taking Percocet approx 1-2x/day and has also taken Benadryl and applied hydrocortisone on abd/groin.  Chief Complaint  Patient presents with  . Rash   HPI  OB History    Grav Para Term Preterm Abortions TAB SAB Ect Mult Living   2 2 1 1  0 0 0 0 0 2      Past Medical History  Diagnosis Date  . Anemia   . Preterm labor   . Hypertension   . Pregnancy induced hypertension   . Seizures     eclampsia with first pregnancy  . GERD (gastroesophageal reflux disease)     post partum 1st preg.    Past Surgical History  Procedure Date  . Cesarean section   . Cholecystectomy   . Cesarean section 04/25/2011    Procedure: CESAREAN SECTION;  Surgeon: Tereso Newcomer, MD;  Location: [facility];  Service: Gynecology;  Laterality: N/A;  Repeat C/Section    Family History  Problem Relation Age of Onset  . Mental illness Brother   . Mental retardation Brother   . Hypertension Paternal Grandmother   . Diabetes Paternal Grandmother     History  Substance Use Topics  . Smoking status: Never Smoker   . Smokeless tobacco: Never Used  . Alcohol Use: No    Allergies: No Known Allergies  Prescriptions prior to admission  Medication Sig Dispense Refill  . diphenhydrAMINE (BENADRYL) 25 mg capsule Take 1 capsule (25 mg total) by mouth every 4 (four) hours as needed for itching.  30 capsule    . hydrocortisone cream 1 % Apply topically 2 (two) times daily.  30 g  1  . ibuprofen (ADVIL,MOTRIN) 600 MG tablet Take 1 tablet (600 mg total) by mouth every 6 (six) hours as needed for pain.  30 tablet  0  . oxyCODONE-acetaminophen (PERCOCET) 5-325 MG per tablet Take 1-2 tablets by mouth every 4  (four) hours as needed (moderate - severe pain).  30 tablet  0  . Prenatal Vit-Fe Fumarate-FA (PRENATAL MULTIVITAMIN) TABS Take 1 tablet by mouth daily.      [name] senna-docusate (SENOKOT-S) 8.6-50 MG per tablet Take 2 tablets by mouth at bedtime.  30 tablet  0    Review of Systems  Constitutional: Negative for fever and chills.  Gastrointestinal: Negative for nausea and vomiting.  Genitourinary: Negative for dysuria.   Physical Exam   Blood pressure 120/74, pulse 102, temperature 99.2 F (37.3 C), temperature source Oral, resp. rate 18, height 5\' 1"  (1.549 m), weight 83.28 kg (183 lb 9.6 oz), currently breastfeeding.  Physical Exam  Constitutional: She appears well-developed and well-nourished.  HENT:  Head: Normocephalic.  Eyes:       Pt reports eye swelling 1/4; none noted during exam  Cardiovascular: Normal rate.   Respiratory: Effort normal.  GI: Soft.       Diffuse rash possibly present on abd; dk skin makes it difficult to eval  Skin: Skin is warm and dry. Rash (diffuse rash in bilat groin area; reports itching on lower back but no skin changes noted) noted.  Psychiatric: She has a normal mood and affect. Her behavior is normal.    MAU Course  Procedures    Assessment and Plan  SP 8d since C/S Likely allergy to Percocet Will medicate with Atarax 50mg  here, and d/c home with rx 25mg  PO prn D/C Percocet Continue Motrin 600mg  q 6hr for pain If no improvement with stopping Percocet, needs eval by derm Keep PP appointment as sched  Devian Bartolomei 05/03/2011, 1:12 PM

## 2011-05-03 NOTE — ED Notes (Signed)
Called, not in lobby 

## 2011-06-04 ENCOUNTER — Ambulatory Visit: Payer: Medicaid Other | Admitting: Obstetrics and Gynecology

## 2011-06-13 ENCOUNTER — Emergency Department (HOSPITAL_COMMUNITY)
Admission: EM | Admit: 2011-06-13 | Discharge: 2011-06-13 | Disposition: A | Discharge status: Home / Self Care | Payer: Medicaid Other | Attending: Emergency Medicine | Admitting: Emergency Medicine

## 2011-06-13 DIAGNOSIS — I10 Essential (primary) hypertension: Secondary | ICD-10-CM | POA: Insufficient documentation

## 2011-06-13 DIAGNOSIS — R51 Headache: Secondary | ICD-10-CM | POA: Insufficient documentation

## 2011-06-13 DIAGNOSIS — R059 Cough, unspecified: Secondary | ICD-10-CM | POA: Insufficient documentation

## 2011-06-13 DIAGNOSIS — IMO0001 Reserved for inherently not codable concepts without codable children: Secondary | ICD-10-CM | POA: Insufficient documentation

## 2011-06-13 DIAGNOSIS — K219 Gastro-esophageal reflux disease without esophagitis: Secondary | ICD-10-CM | POA: Insufficient documentation

## 2011-06-13 DIAGNOSIS — R05 Cough: Secondary | ICD-10-CM | POA: Insufficient documentation

## 2011-06-13 DIAGNOSIS — R509 Fever, unspecified: Secondary | ICD-10-CM | POA: Insufficient documentation

## 2011-06-13 DIAGNOSIS — J3489 Other specified disorders of nose and nasal sinuses: Secondary | ICD-10-CM | POA: Insufficient documentation

## 2011-06-13 DIAGNOSIS — R070 Pain in throat: Secondary | ICD-10-CM | POA: Insufficient documentation

## 2011-06-13 DIAGNOSIS — J111 Influenza due to unidentified influenza virus with other respiratory manifestations: Secondary | ICD-10-CM | POA: Insufficient documentation

## 2011-06-13 DIAGNOSIS — R000 Tachycardia, unspecified: Secondary | ICD-10-CM | POA: Insufficient documentation

## 2011-06-13 DIAGNOSIS — G40909 Epilepsy, unspecified, not intractable, without status epilepticus: Secondary | ICD-10-CM | POA: Insufficient documentation

## 2011-06-13 LAB — RAPID STREP SCREEN (MED CTR MEBANE ONLY): Streptococcus, Group A Screen (Direct): NEGATIVE

## 2011-06-13 MED ORDER — IBUPROFEN 200 MG PO TABS: 600.0000 mg | ORAL_TABLET | Freq: Once | ORAL | Status: DC

## 2011-06-13 MED ORDER — SODIUM CHLORIDE 0.9 % IV BOLUS (SEPSIS)
1000.0000 mL | Freq: Once | INTRAVENOUS | Status: AC
  Administered 2011-06-13: 1000 mL via INTRAVENOUS

## 2011-06-13 MED ORDER — IBUPROFEN 200 MG PO TABS
ORAL_TABLET | ORAL | Status: AC
  Filled 2011-06-13: qty 3

## 2011-06-13 MED ORDER — ACETAMINOPHEN 325 MG PO TABS
650.0000 mg | ORAL_TABLET | Freq: Once | ORAL | Status: AC
  Administered 2011-06-13: 650 mg via ORAL

## 2011-06-13 MED ORDER — HYDROCOD POLST-CHLORPHEN POLST 10-8 MG/5ML PO LQCR: 5.0000 mL | Freq: Two times a day (BID) | ORAL | Status: DC | PRN

## 2011-06-13 MED ORDER — DEXAMETHASONE SODIUM PHOSPHATE 10 MG/ML IJ SOLN
10.0000 mg | Freq: Once | INTRAMUSCULAR | Status: AC
  Administered 2011-06-13: 10 mg via INTRAVENOUS
  Filled 2011-06-13: qty 1

## 2011-06-13 MED ORDER — ACETAMINOPHEN 325 MG PO TABS
ORAL_TABLET | ORAL | Status: AC
  Filled 2011-06-13: qty 2

## 2011-06-13 NOTE — ED Provider Notes (Signed)
History     CSN: [id number]  Arrival date & time 06/13/11  1001   First MD Initiated Contact with Patient 06/13/11 1036      Chief Complaint  Patient presents with  . Generalized Body Aches  . Sore Throat     The history is provided by the patient.  Pt is a 21 y/o F who presents with a complaint of sore throat assoc with cough x approx 10 days. The cough was non-productive and there were no other associated symptoms until last night, when the throat pain worsened and the cough became productive of green sputum. There has also been associated fever, chills, HA, fatigue, rhinorrhea, and myalgias since last night. There has been no associated eye pain, ear pain, CP, SOB, nausea, vomiting, abd pain. She has been taking ibuprofen at home for pain and fever with minimal relief. Swallowing makes the throat pain worse, nothing makes it much better.   Past Medical History  Diagnosis Date  . Anemia   . Preterm labor   . Hypertension   . Pregnancy induced hypertension   . Seizures     eclampsia with first pregnancy  . GERD (gastroesophageal reflux disease)     post partum 1st preg.    Past Surgical History  Procedure Date  . Cesarean section   . Cholecystectomy   . Cesarean section 04/25/2011    Procedure: CESAREAN SECTION;  Surgeon: Tereso Newcomer, MD;  Location: [facility];  Service: Gynecology;  Laterality: N/A;  Repeat C/Section    Family History  Problem Relation Age of Onset  . Mental illness Brother   . Mental retardation Brother   . Hypertension Paternal Grandmother   . Diabetes Paternal Grandmother     History  Substance Use Topics  . Smoking status: Never Smoker   . Smokeless tobacco: Never Used  . Alcohol Use: No    OB History    Grav Para Term Preterm Abortions TAB SAB Ect Mult Living   2 2 1 1  0 0 0 0 0 2      Review of Systems 10 systems reviewed and are negative for acute change except as noted in the HPI.  Allergies  Percocet  Home Medications    Current Outpatient Rx  Name Route Sig Dispense Refill  . PRENATAL MULTIVITAMIN CH Oral Take 1 tablet by mouth daily.      BP 97/54  Pulse 79  Temp(Src) 98.8 F (37.1 C) (Oral)  Resp 16  Ht 5' (1.524 m)  Wt 190 lb (86.183 kg)  BMI 37.11 kg/m2  SpO2 100%  Physical Exam  Nursing note and vitals reviewed. Constitutional: She is oriented to person, place, and time. She appears well-developed and well-nourished.       Uncomfortable appearing  HENT:  Head: Normocephalic and atraumatic.  Right Ear: Hearing, tympanic membrane and external ear normal.  Left Ear: Hearing and tympanic membrane normal.  Nose: Nose normal.  Mouth/Throat: Oropharyngeal exudate present.  Eyes: Conjunctivae and EOM are normal. Pupils are equal, round, and reactive to light.  Neck: Normal range of motion. Neck supple.  Cardiovascular: Regular rhythm, normal heart sounds and intact distal pulses.        tachycardia  Pulmonary/Chest: Effort normal and breath sounds normal. No respiratory distress. She has no wheezes. She exhibits no tenderness.  Abdominal: Soft. Bowel sounds are normal. She exhibits no distension. There is no tenderness. There is no rebound and no guarding.  Musculoskeletal: She exhibits no edema and no tenderness.  MAEW  Lymphadenopathy:    She has no cervical adenopathy.  Neurological: She is alert and oriented to person, place, and time. Coordination normal.  Skin: Skin is warm and dry. No rash noted. She is not diaphoretic.    ED Course  Procedures (including critical care time)   Labs Reviewed  RAPID STREP SCREEN  STREP A DNA PROBE   No results found.   1. Influenza-like illness       MDM  10:50 AM Pt seen and evaluated. Initial history and physical examination complete. Sore throat with additional sx x 1 day. AF, tachy. Acetaminophen given. Will administer IVF as pt reports poor fluid intake secondary to pain for the last several days. Strep screen  sent.    12:18 PM Pt currently AF, tachycardia resolved, feels somewhat better. Strep screen neg, DNA probe sent. Requests pain medication stronger than motrin for home use. Will d/c home. No signs of ludwig angina. Given duration of symptoms overall, not withint 24-48 hour window for tamiflu.        Shaaron Adler, PA-C 06/13/11 1229

## 2011-06-13 NOTE — ED Notes (Signed)
Pt c/o body aches and sore throat x 1 week.  Pt febrile on arrival. Pt denies N/V/D.

## 2011-06-13 NOTE — Discharge Instructions (Signed)
As we discussed, your strep throat screen was negative. If the additional testing on this comes back positive, he will receive a phone call to discuss further necessary testing. Throat lozenges such as cepacol and have a numbing effect on the throat. You can also use saltwater gargles. Use Motrin for mild to moderate pain, muscle aches, and fever. You can use the prescription as needed for more severe throat pain and cough.    Viral Infections A virus is a type of germ. Viruses can cause:  Minor sore throats.   Aches and pains.   Headaches.   Runny nose.   Rashes.   Watery eyes.   Tiredness.   Coughs.   Loss of appetite.   Feeling sick to your stomach (nausea).   Throwing up (vomiting).   Watery poop (diarrhea).  HOME CARE   Only take medicines as told by your doctor.   Drink enough water and fluids to keep your pee (urine) clear or pale yellow. Sports drinks are a good choice.   Get plenty of rest and eat healthy. Soups and broths with crackers or rice are fine.  GET HELP RIGHT AWAY IF:   You have a very bad headache.   You have shortness of breath.   You have chest pain or neck pain.   You have an unusual rash.   You cannot stop throwing up.   You have watery poop that does not stop.   You cannot keep fluids down.   You or your child has a temperature by mouth above 102 F (38.9 C), not controlled by medicine.   Your baby is older than 3 months with a rectal temperature of 102 F (38.9 C) or higher.   Your baby is 32 months old or younger with a rectal temperature of 100.4 F (38 C) or higher.  MAKE SURE YOU:   Understand these instructions.   Will watch this condition.   Will get help right away if you are not doing well or get worse.  Document Released: 03/27/2008 Document Revised: 12/25/2010 Document Reviewed: 08/20/2010 [facility] Patient Information 2012 [place], [place].         Salt Water Gargle This solution will help make your mouth  and throat feel better. HOME CARE INSTRUCTIONS   Mix 1 teaspoon of salt in 8 ounces of warm water.   Gargle with this solution as much or often as you need or as directed. Swish and gargle gently if you have any sores or wounds in your mouth.   Do not swallow this mixture.  Document Released: 01/17/2004 Document Revised: 12/25/2010 Document Reviewed: 06/09/2008 [facility] Patient Information 2012 [place], [place].

## 2011-06-13 NOTE — ED Notes (Signed)
Notified [place], RN of BP

## 2011-06-14 LAB — STREP A DNA PROBE
Group A Strep Probe: NEGATIVE
Special Requests: NORMAL

## 2011-06-15 NOTE — ED Provider Notes (Signed)
Medical screening examination/treatment/procedure(s) were performed by non-physician practitioner and as supervising physician I was immediately available for consultation/collaboration.  Britten Seyfried T Jeremias Broyhill, MD 06/15/11 0948 

## 2011-10-13 ENCOUNTER — Inpatient Hospital Stay (HOSPITAL_COMMUNITY)
Admission: AD | Admit: 2011-10-13 | Discharge: 2011-10-13 | Disposition: A | Discharge status: Home / Self Care | Payer: Medicaid Other | Source: Ambulatory Visit | Attending: Obstetrics & Gynecology | Admitting: Obstetrics & Gynecology

## 2011-10-13 ENCOUNTER — Encounter (HOSPITAL_COMMUNITY): Payer: Self-pay | Admitting: *Deleted

## 2011-10-13 DIAGNOSIS — B373 Candidiasis of vulva and vagina: Secondary | ICD-10-CM

## 2011-10-13 DIAGNOSIS — N390 Urinary tract infection, site not specified: Secondary | ICD-10-CM

## 2011-10-13 DIAGNOSIS — B3731 Acute candidiasis of vulva and vagina: Secondary | ICD-10-CM

## 2011-10-13 DIAGNOSIS — R1031 Right lower quadrant pain: Secondary | ICD-10-CM | POA: Insufficient documentation

## 2011-10-13 LAB — URINALYSIS, ROUTINE W REFLEX MICROSCOPIC
Bilirubin Urine: NEGATIVE
Hgb urine dipstick: NEGATIVE
Protein, ur: NEGATIVE mg/dL
Urobilinogen, UA: 0.2 mg/dL (ref 0.0–1.0)

## 2011-10-13 LAB — WET PREP, GENITAL
Clue Cells Wet Prep HPF POC: NONE SEEN
Trich, Wet Prep: NONE SEEN

## 2011-10-13 MED ORDER — FLUCONAZOLE 150 MG PO TABS: 150.0000 mg | ORAL_TABLET | Freq: Once | ORAL | Status: DC

## 2011-10-13 MED ORDER — IBUPROFEN 600 MG PO TABS: 600.0000 mg | ORAL_TABLET | Freq: Four times a day (QID) | ORAL | Status: DC | PRN

## 2011-10-13 MED ORDER — PHENAZOPYRIDINE HCL 200 MG PO TABS: 200.0000 mg | ORAL_TABLET | Freq: Three times a day (TID) | ORAL | Status: AC

## 2011-10-13 MED ORDER — IBUPROFEN 600 MG PO TABS: 600.0000 mg | ORAL_TABLET | Freq: Four times a day (QID) | ORAL | Status: AC | PRN

## 2011-10-13 MED ORDER — FLUCONAZOLE 150 MG PO TABS: 150.0000 mg | ORAL_TABLET | Freq: Once | ORAL | Status: AC

## 2011-10-13 MED ORDER — PHENAZOPYRIDINE HCL 200 MG PO TABS: 200.0000 mg | ORAL_TABLET | Freq: Three times a day (TID) | ORAL | Status: DC

## 2011-10-13 MED ORDER — CEPHALEXIN 500 MG PO CAPS: 500.0000 mg | ORAL_CAPSULE | Freq: Four times a day (QID) | ORAL | Status: DC

## 2011-10-13 MED ORDER — CEPHALEXIN 500 MG PO CAPS: 500.0000 mg | ORAL_CAPSULE | Freq: Four times a day (QID) | ORAL | Status: AC

## 2011-10-13 MED ORDER — KETOROLAC TROMETHAMINE 60 MG/2ML IM SOLN
60.0000 mg | Freq: Once | INTRAMUSCULAR | Status: AC
  Administered 2011-10-13: 60 mg via INTRAMUSCULAR
  Filled 2011-10-13: qty 2

## 2011-10-13 NOTE — MAU Provider Note (Signed)
Renee Suarez 79 @Unknown  by LMP Chief Complaint  Patient presents with  . Abdominal Pain     First Provider Initiated Contact with Patient 10/13/11 2225      SUBJECTIVE  HPI: Pt reports sudden onset on RLQ/mid abd pain several hours ago (points to mid low abd). She has not tried any Tx for it. Mirena in place x ~4 months. No complications. Reports frequency and denies dysuria, urgency, flank pain, fever, chills, N/V/D/C, vaginal discharge, irreg vaginal bleeding.   Past Medical History  Diagnosis Date  . Anemia   . Preterm labor   . Hypertension   . Pregnancy induced hypertension   . Seizures     eclampsia with first pregnancy  . GERD (gastroesophageal reflux disease)     post partum 1st preg.   Past Surgical History  Procedure Date  . Cesarean section   . Cholecystectomy   . Cesarean section 04/25/2011    Procedure: CESAREAN SECTION;  Surgeon: Tereso Newcomer, MD;  Location: [facility];  Service: Gynecology;  Laterality: N/A;  Repeat C/Section   History   Social History  . Marital Status: Single    Spouse Name: N/A    Number of Children: N/A  . Years of Education: N/A   Occupational History  . Not on file.   Social History Main Topics  . Smoking status: Never Smoker   . Smokeless tobacco: Never Used  . Alcohol Use: No  . Drug Use: No  . Sexually Active: Yes    Birth Control/ Protection: None     2 weeks ago (no new partners)   Other Topics Concern  . Not on file   Social History Narrative  . No narrative on file   No current facility-administered medications on file prior to encounter.   No current outpatient prescriptions on file prior to encounter.   Allergies  Allergen Reactions  . Percocet (Oxycodone-Acetaminophen) Hives    ROS: Pertinent items in HPI  OBJECTIVE Blood pressure 113/66, pulse 83, temperature 98.6 F (37 C), temperature source Oral, resp. rate 16, height 5' (1.524 m), weight 87.454 kg (192 lb 12.8 oz), unknown if  currently breastfeeding.  GENERAL: Well-developed, well-nourished female in mild distress.  HEENT: Normocephalic, good dentition HEART: normal rate RESP: normal effort ABDOMEN: Soft, mild tenderness above SP. No rebound tenderness or mass.  EXTREMITIES: Nontender, no edema NEURO: Alert and oriented SPECULUM EXAM: NEFG, moderate amount of thick, curd-like discharge, no blood noted, cervix clean. Strings seen. BIMANUAL: cervix closed; uterus NSSP; No CMT or adnexal tenderness or masses  LAB RESULTS Results for orders placed during the hospital encounter of 10/13/11 (from the past 24 hour(s))  URINALYSIS, ROUTINE W REFLEX MICROSCOPIC     Status: Abnormal   Collection Time   10/13/11  8:09 PM      Component Value Range   Color, Urine YELLOW  YELLOW   APPearance HAZY (*) CLEAR   Specific Gravity, Urine 1.015  1.005 - 1.030   pH 6.5  5.0 - 8.0   Glucose, UA NEGATIVE  NEGATIVE mg/dL   Hgb urine dipstick NEGATIVE  NEGATIVE   Bilirubin Urine NEGATIVE  NEGATIVE   Ketones, ur NEGATIVE  NEGATIVE mg/dL   Protein, ur NEGATIVE  NEGATIVE mg/dL   Urobilinogen, UA 0.2  0.0 - 1.0 mg/dL   Nitrite POSITIVE (*) NEGATIVE   Leukocytes, UA TRACE (*) NEGATIVE  URINE MICROSCOPIC-ADD ON     Status: Abnormal   Collection Time   10/13/11  8:09 PM  Component Value Range   Squamous Epithelial / LPF FEW (*) RARE   WBC, UA 7-10  <3 WBC/hpf   Bacteria, UA MANY (*) RARE  WET PREP, GENITAL     Status: Abnormal   Collection Time   10/13/11 10:50 PM      Component Value Range   Yeast Wet Prep HPF POC NONE SEEN  NONE SEEN   Trich, Wet Prep NONE SEEN  NONE SEEN   Clue Cells Wet Prep HPF POC NONE SEEN  NONE SEEN   WBC, Wet Prep HPF POC FEW (*) NONE SEEN   IMAGING  ED COURSE  Pain resolved w/ Toradol  ASSESSMENT 1. UTI (lower urinary tract infection)   2. Candidiasis of vulva and vagina    PLAN D/C home Follow-up Information    Follow up with [facility]. (As needed if symptoms worsen)     Contact information:   [address] [place] [address] [phone]        Medication List  As of 10/14/2011  4:15 AM   START taking these medications         cephALEXin 500 MG capsule   Commonly known as: KEFLEX   Take 1 capsule (500 mg total) by mouth 4 (four) times daily.      fluconazole 150 MG tablet   Commonly known as: DIFLUCAN   Take 1 tablet (150 mg total) by mouth once. Take one tablet. If no relief of symptoms after 3 days, take second tablet.      ibuprofen 600 MG tablet   Commonly known as: ADVIL,MOTRIN   Take 1 tablet (600 mg total) by mouth every 6 (six) hours as needed for pain.      phenazopyridine 200 MG tablet   Commonly known as: PYRIDIUM   Take 1 tablet (200 mg total) by mouth 3 (three) times daily.          Where to get your medications    These are the prescriptions that you need to pick up.   You may get these medications from any pharmacy.         cephALEXin 500 MG capsule   fluconazole 150 MG tablet   ibuprofen 600 MG tablet   phenazopyridine 200 MG tablet           Orpha Dain 10/13/2011 10:26 PM

## 2011-10-13 NOTE — Discharge Instructions (Signed)
Your symptoms may take up to two days to improve.

## 2011-10-13 NOTE — MAU Note (Signed)
Pt G2 P2, LMP 1 month ago, mirena placed.  Pt having RLQ pain that stated suddenly.  Denies bleeding.  White discharge no odor.

## 2011-10-15 LAB — GC/CHLAMYDIA PROBE AMP, GENITAL
Chlamydia, DNA Probe: NEGATIVE
GC Probe Amp, Genital: NEGATIVE

## 2012-02-06 ENCOUNTER — Encounter (HOSPITAL_COMMUNITY): Payer: Self-pay | Admitting: Emergency Medicine

## 2012-02-06 ENCOUNTER — Emergency Department (HOSPITAL_COMMUNITY)
Admission: EM | Admit: 2012-02-06 | Discharge: 2012-02-06 | Disposition: A | Discharge status: Home / Self Care | Payer: Self-pay | Attending: Emergency Medicine | Admitting: Emergency Medicine

## 2012-02-06 DIAGNOSIS — R51 Headache: Secondary | ICD-10-CM | POA: Insufficient documentation

## 2012-02-06 DIAGNOSIS — N390 Urinary tract infection, site not specified: Secondary | ICD-10-CM | POA: Insufficient documentation

## 2012-02-06 LAB — CBC WITH DIFFERENTIAL/PLATELET
Basophils Absolute: 0 10*3/uL (ref 0.0–0.1)
HCT: 42 % (ref 36.0–46.0)
Hemoglobin: 14.3 g/dL (ref 12.0–15.0)
Lymphocytes Relative: 29 % (ref 12–46)
Monocytes Relative: 4 % (ref 3–12)
Neutro Abs: 5 10*3/uL (ref 1.7–7.7)
Neutrophils Relative %: 65 % (ref 43–77)
RDW: 13.3 % (ref 11.5–15.5)
WBC: 7.8 10*3/uL (ref 4.0–10.5)

## 2012-02-06 LAB — URINALYSIS, ROUTINE W REFLEX MICROSCOPIC
Leukocytes, UA: NEGATIVE
Nitrite: POSITIVE — AB
Protein, ur: NEGATIVE mg/dL
Specific Gravity, Urine: 1.015 (ref 1.005–1.030)
Urobilinogen, UA: 1 mg/dL (ref 0.0–1.0)

## 2012-02-06 LAB — POCT PREGNANCY, URINE: Preg Test, Ur: NEGATIVE

## 2012-02-06 LAB — URINE MICROSCOPIC-ADD ON

## 2012-02-06 MED ORDER — KETOROLAC TROMETHAMINE 30 MG/ML IJ SOLN
30.0000 mg | Freq: Once | INTRAMUSCULAR | Status: DC
  Filled 2012-02-06: qty 1

## 2012-02-06 MED ORDER — DIPHENHYDRAMINE HCL 25 MG PO CAPS
25.0000 mg | ORAL_CAPSULE | Freq: Once | ORAL | Status: AC
  Administered 2012-02-06: 25 mg via ORAL
  Filled 2012-02-06: qty 1

## 2012-02-06 MED ORDER — IBUPROFEN 800 MG PO TABS: 800.0000 mg | ORAL_TABLET | Freq: Three times a day (TID) | ORAL | Status: DC

## 2012-02-06 MED ORDER — DIPHENHYDRAMINE HCL 50 MG/ML IJ SOLN: 25.0000 mg | Freq: Once | INTRAMUSCULAR | Status: DC

## 2012-02-06 MED ORDER — CEPHALEXIN 500 MG PO CAPS: 500.0000 mg | ORAL_CAPSULE | Freq: Two times a day (BID) | ORAL | Status: DC

## 2012-02-06 MED ORDER — METOCLOPRAMIDE HCL 5 MG/ML IJ SOLN: 10.0000 mg | Freq: Once | INTRAMUSCULAR | Status: DC

## 2012-02-06 MED ORDER — SODIUM CHLORIDE 0.9 % IV SOLN: INTRAVENOUS | Status: DC

## 2012-02-06 MED ORDER — KETOROLAC TROMETHAMINE 10 MG PO TABS
10.0000 mg | ORAL_TABLET | Freq: Once | ORAL | Status: AC
  Administered 2012-02-06: 10 mg via ORAL
  Filled 2012-02-06: qty 1

## 2012-02-06 MED ORDER — METOCLOPRAMIDE HCL 10 MG PO TABS
10.0000 mg | ORAL_TABLET | Freq: Once | ORAL | Status: AC
  Administered 2012-02-06: 10 mg via ORAL
  Filled 2012-02-06: qty 1

## 2012-02-06 MED ORDER — METOCLOPRAMIDE HCL 5 MG/ML IJ SOLN
10.0000 mg | Freq: Once | INTRAMUSCULAR | Status: DC
  Filled 2012-02-06: qty 2

## 2012-02-06 MED ORDER — DIPHENHYDRAMINE HCL 25 MG PO TABS: 50.0000 mg | ORAL_TABLET | ORAL | Status: DC | PRN

## 2012-02-06 MED ORDER — DIPHENHYDRAMINE HCL 50 MG/ML IJ SOLN
25.0000 mg | Freq: Once | INTRAMUSCULAR | Status: DC
  Filled 2012-02-06: qty 1

## 2012-02-06 MED ORDER — KETOROLAC TROMETHAMINE 30 MG/ML IJ SOLN: 30.0000 mg | Freq: Once | INTRAMUSCULAR | Status: DC

## 2012-02-06 NOTE — ED Provider Notes (Signed)
History     CSN: [id number]  Arrival date & time 02/06/12  1336   First MD Initiated Contact with Patient 02/06/12 1506      Chief Complaint  Patient presents with  . Headache    (Consider location/radiation/quality/duration/timing/severity/associated sxs/prior treatment) HPI Comments: CIIN OBRYAN 21 y.o. female   The chief complaint is: Patient presents with:   Headache    21 year old female presents today with chief complaint of 3 days of headache. She has a history of chronic migraines. She states that she gets headaches approximately 3 times a week. Patient states that her headache is currently an 8/10 and is somewhat resolved from 10 out of 10. She does have photo and phonophobia, which is common for her migraines. She's had nausea and had one episode of vomiting today. She states that she generally tries to stay home and take Advil, and lying in a dark room. However, today. She had onset of brief visual disturbance in the right eye that included Vision blurriness as well as red spots. This lasted approximately 3 seconds and resolved. She has not had visual disturbance, previously wiith her migraines. She denies any facial asymmetry, weakness, dysarthria,  difficulty swallowing, or dystaxia. She states that her headaches always come on suddenly and painfully. She has taken Advil without relief of the headache. She denies chills, fever,  myalgias, arthralgias, abdominal pain, diarrhea, chest pain, shortness of breath. Negative History of hypertension. She is not a smoker and no diabetes. No family history of early MI or CVA.     The history is provided by the patient. No language interpreter was used.    Past Medical History  Diagnosis Date  . Anemia   . Preterm labor   . Hypertension   . Pregnancy induced hypertension   . Seizures     eclampsia with first pregnancy  . GERD (gastroesophageal reflux disease)     post partum 1st preg.    Past Surgical History    Procedure Date  . Cesarean section   . Cholecystectomy   . Cesarean section 04/25/2011    Procedure: CESAREAN SECTION;  Surgeon: Tereso Newcomer, MD;  Location: [facility];  Service: Gynecology;  Laterality: N/A;  Repeat C/Section    Family History  Problem Relation Age of Onset  . Mental illness Brother   . Mental retardation Brother   . Hypertension Paternal Grandmother   . Diabetes Paternal Grandmother     History  Substance Use Topics  . Smoking status: Never Smoker   . Smokeless tobacco: Never Used  . Alcohol Use: No    OB History    Grav Para Term Preterm Abortions TAB SAB Ect Mult Living   2 2 1 1  0 0 0 0 0 2      Review of Systems  Constitutional: Negative for fever and chills.  HENT: Negative for congestion, trouble swallowing, neck pain and neck stiffness.   Eyes: Positive for visual disturbance.  Respiratory: Negative for shortness of breath.   Cardiovascular: Negative for chest pain.  Gastrointestinal: Positive for nausea and vomiting. Negative for abdominal pain, diarrhea and constipation.  Genitourinary: Negative for dysuria and hematuria.  Musculoskeletal: Negative for myalgias and arthralgias.  Skin: Negative for rash.  Neurological: Positive for headaches. Negative for dizziness, syncope, speech difficulty, weakness and numbness.  All other systems reviewed and are negative.    Allergies  Percocet  Home Medications   Current Outpatient Rx  Name Route Sig Dispense Refill  . IBUPROFEN 600  MG PO TABS Oral Take 600 mg by mouth every 8 (eight) hours as needed. For pain.      BP 118/66  Pulse 68  Temp 97.8 F (36.6 C) (Oral)  Resp 14  SpO2 100%  Physical Exam  Nursing note and vitals reviewed. Constitutional: She is oriented to person, place, and time. She appears well-developed and well-nourished. No distress.       Obese female, sitting in the dark. She appears uncomfortable. She squints in the light.  HENT:  Head: Normocephalic and  atraumatic.  Eyes: Conjunctivae normal are normal. No scleral icterus.  Neck: Normal range of motion.  Cardiovascular: Normal rate, regular rhythm and normal heart sounds.  Exam reveals no gallop and no friction rub.   No murmur heard. Pulmonary/Chest: Effort normal and breath sounds normal. No respiratory distress.  Abdominal: Soft. Bowel sounds are normal. She exhibits no distension and no mass. There is no tenderness. There is no guarding.  Neurological: She is alert and oriented to person, place, and time. She has normal reflexes. She displays normal reflexes. No cranial nerve deficit. She exhibits normal muscle tone. Coordination normal.  Skin: Skin is warm and dry. She is not diaphoretic.  Psychiatric: She has a normal mood and affect. Her behavior is normal. Judgment and thought content normal.    ED Course  Procedures (including critical care time)  Labs Reviewed  CBC WITH DIFFERENTIAL - Abnormal; Notable for the following:    RBC 5.73 (*)     MCV 73.3 (*)     MCH 25.0 (*)     All other components within normal limits  URINALYSIS, ROUTINE W REFLEX MICROSCOPIC - Abnormal; Notable for the following:    APPearance CLOUDY (*)     Hgb urine dipstick MODERATE (*)     Nitrite POSITIVE (*)     All other components within normal limits  URINE MICROSCOPIC-ADD ON - Abnormal; Notable for the following:    Bacteria, UA MANY (*)     All other components within normal limits  POCT PREGNANCY, URINE  LAB REPORT - SCANNED   No results found.   1. Headache   2. UTI (lower urinary tract infection)       MDM  The patient in no acute distress. Other than visual change, today which has resolved,symptoms are the same as her usual migraines. Patient will get Migraine cocktail. Do not feel that evaluation with CT scan of the head is necessary.NO neurologic deficits, does not present as subarachnoid hemorrhage. Also, her headache has been present for 3 days. Obtaining labs at this time.     Patient labs positive for UTI.  Patient is nursing a 74 month old.  I have asked her to refrain form nursing for the next week while she is on abx.  Patient agrees.  IV team was called to access vein and unable.  Patient declined IM meds and has taken PO. She is feeling much better and will f/u with [facility]. Discussed reasons to seek immediate care. Patient expresses understanding and agrees with plan       Arthor Captain, PA-C 02/07/12 2046

## 2012-02-06 NOTE — ED Notes (Signed)
Pt sts headache x 3 days.  Sts n/v started this morning.  Sts occasional blurred vision in R eye that started this AM.  Pt sts light and sound makes headache worse.  Sts Hx of migraines and denies injury.

## 2012-02-06 NOTE — ED Notes (Signed)
IV team paged and responded.

## 2012-02-06 NOTE — ED Notes (Signed)
Harris PA notified of unsuccessful IV starts- Ok to give injections IM per PA verbal order

## 2012-02-06 NOTE — ED Notes (Signed)
Pt presenting to ed with c/o headache pain x 3 days with positive nausea and vomiting. Pt states she had blurred vision this morning in her right eye only. Pt denies blurred vision at this time. Pt state she has sensitivity to light and noise. Pt states this does not feel like her normal migraine.

## 2012-02-08 NOTE — ED Provider Notes (Signed)
Medical screening examination/treatment/procedure(s) were conducted as a shared visit with non-physician practitioner(s) and myself.  I personally evaluated the patient during the encounter.  Usual migraine headache without neuro deficits  Donnetta Hutching, MD 02/08/12 0000

## 2012-03-31 IMAGING — US US OB NUCHAL TRANSLUCENCY 1ST GEST
1 series · 14 of 28 positions shown · non-contrast
Comparison: none

[Series 1: us ob nuchal translucency 1st gest · 0.14mm/px · 14 of 32 slices shown]
[im 2/32]
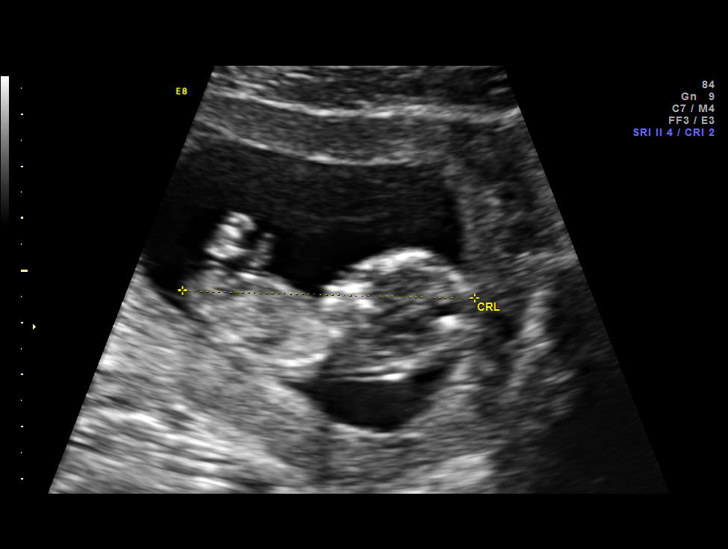
[im 4/32]
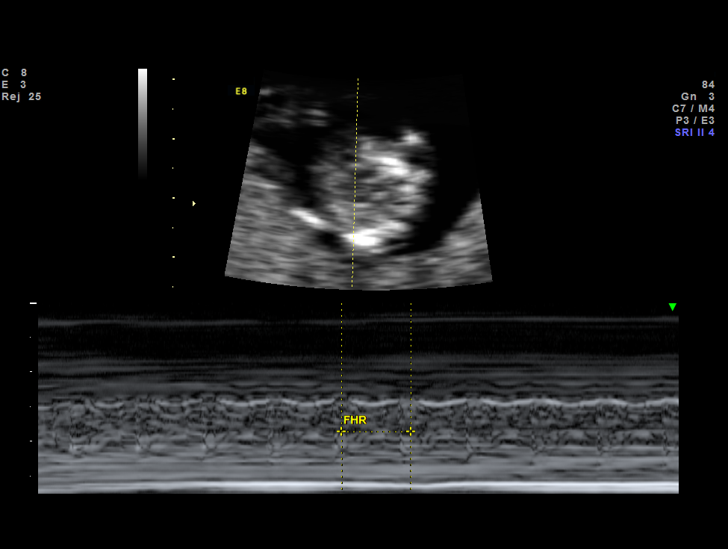
[im 6/32]
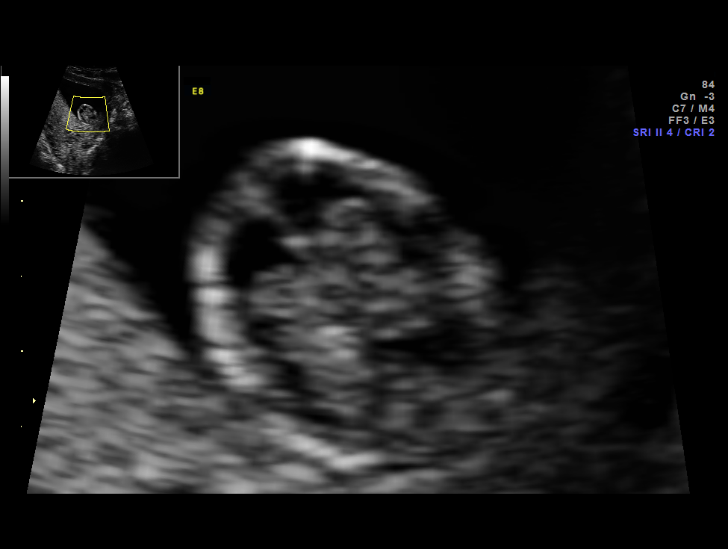
[im 9/32]
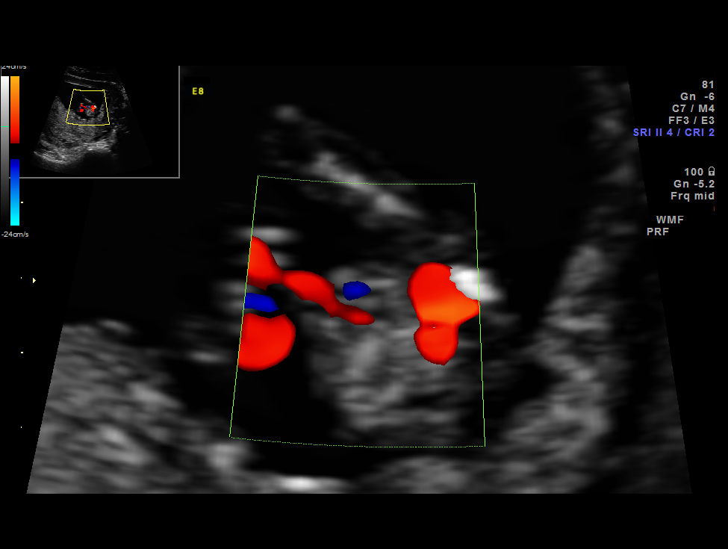
[im 11/32]
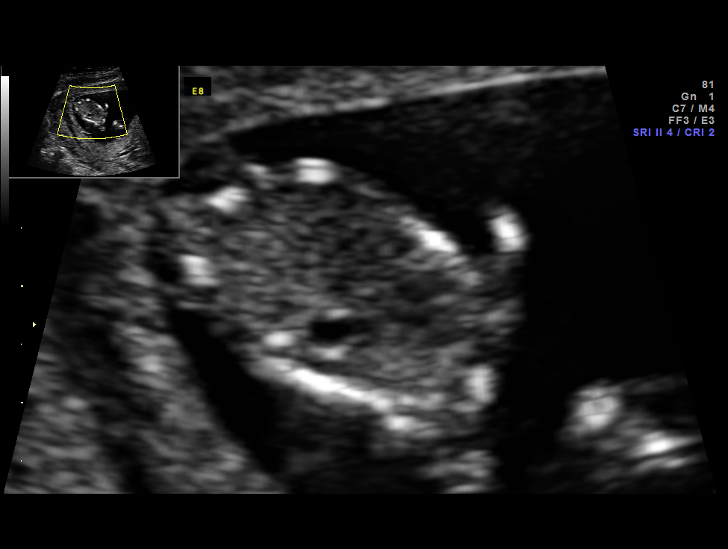
[im 13/32]
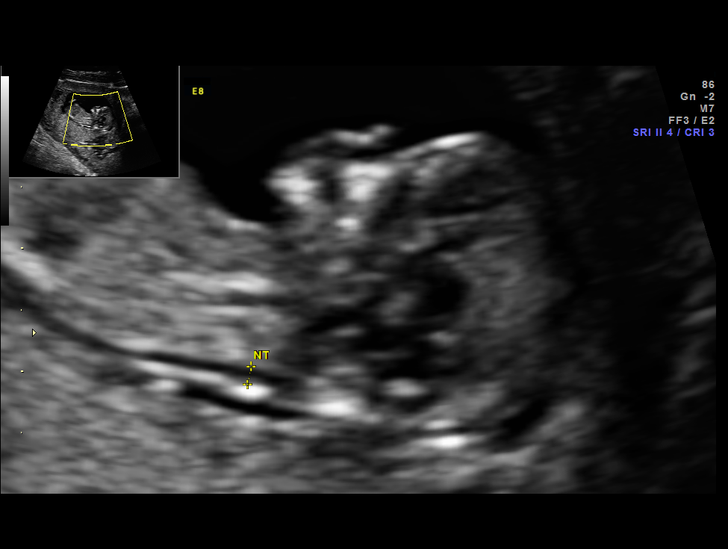
[im 15/32]
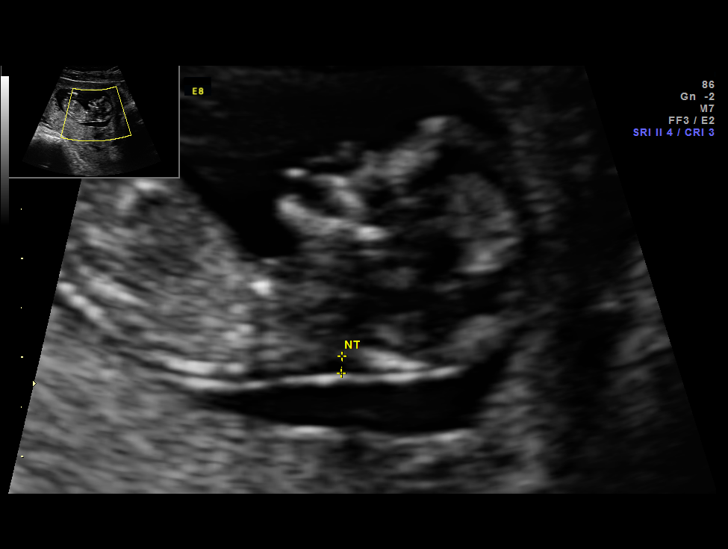
[im 18/32]
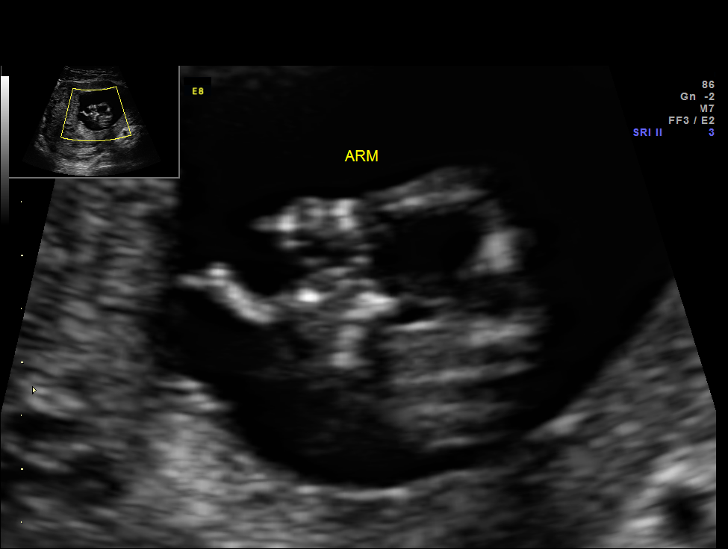
[im 20/32]
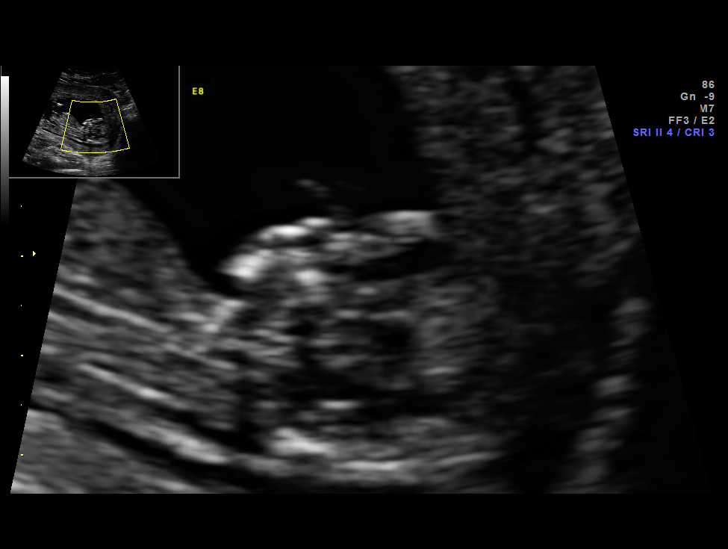
[im 22/32]
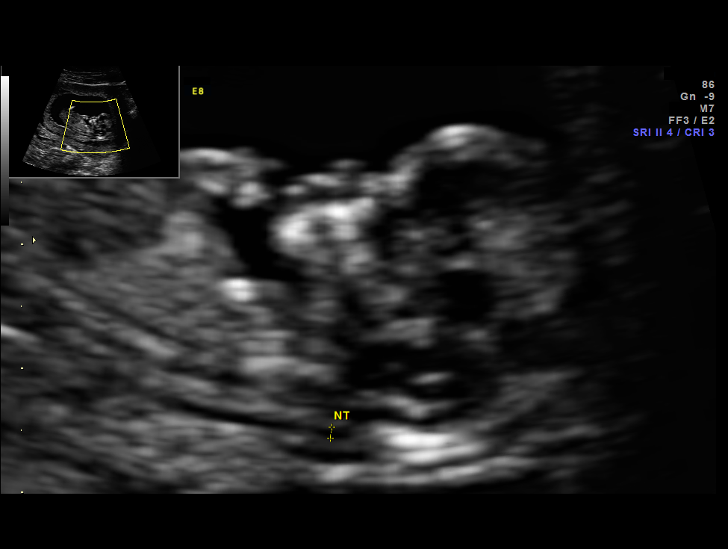
[im 25/32]
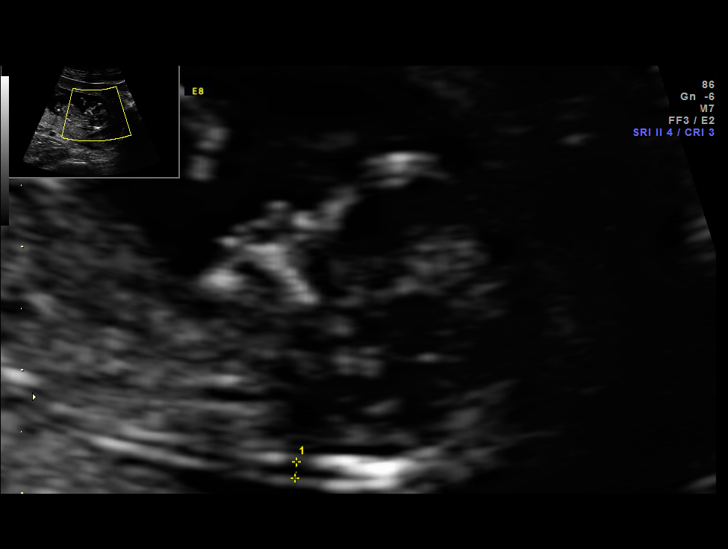
[im 27/32]
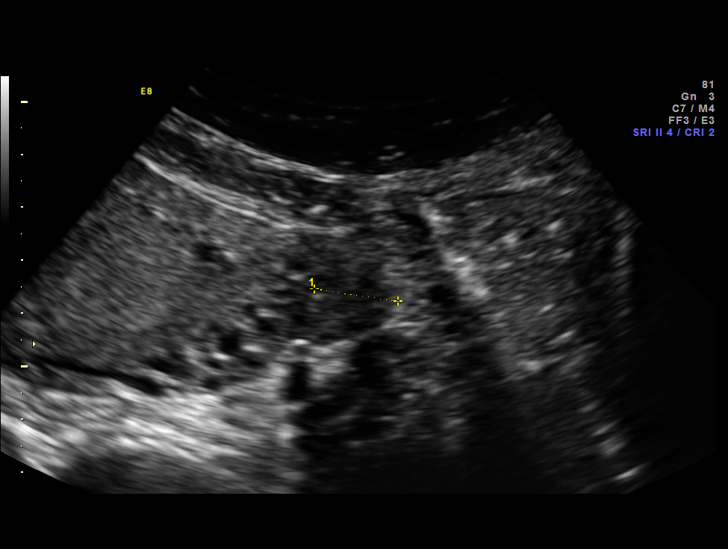
[im 29/32]
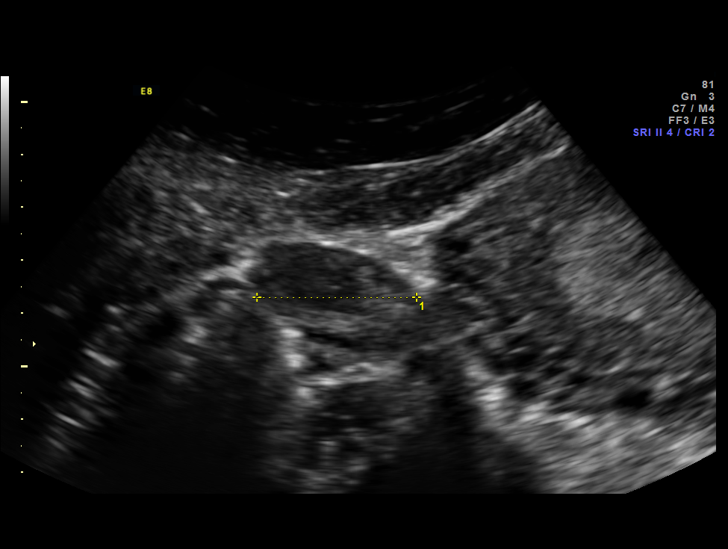
[im 32/32]
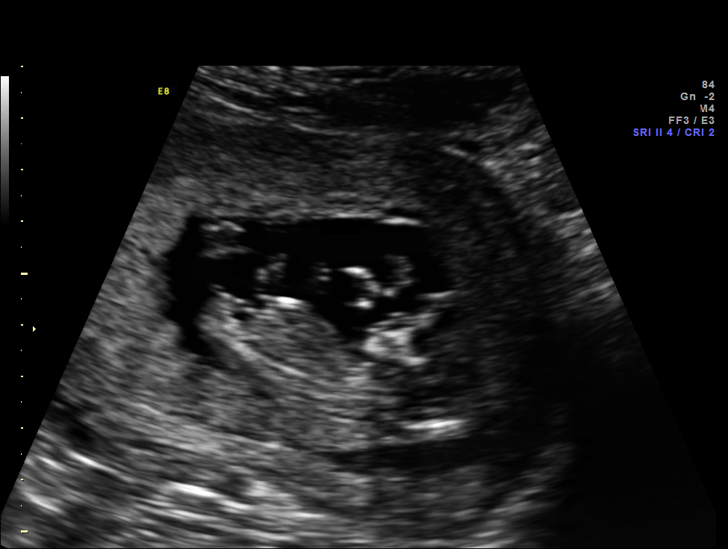

[14 of 28 positions shown; findings below may reference images not displayed]

Canned report from images found in remote index.

Refer to host system for actual result text.

## 2012-06-01 NOTE — Progress Notes (Signed)
This encounter was created in error - please disregard.

## 2012-07-14 IMAGING — US US OB FOLLOW-UP
1 of 2 series · 12 of 28 positions shown · non-contrast
Comparison: none

[Series 1: us ob follow up · 12 of 40 slices shown]
[im 2/40]
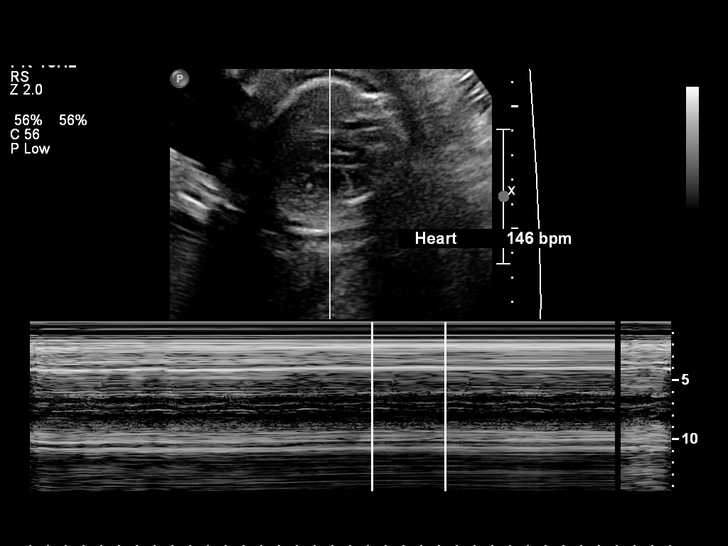
[im 5/40]
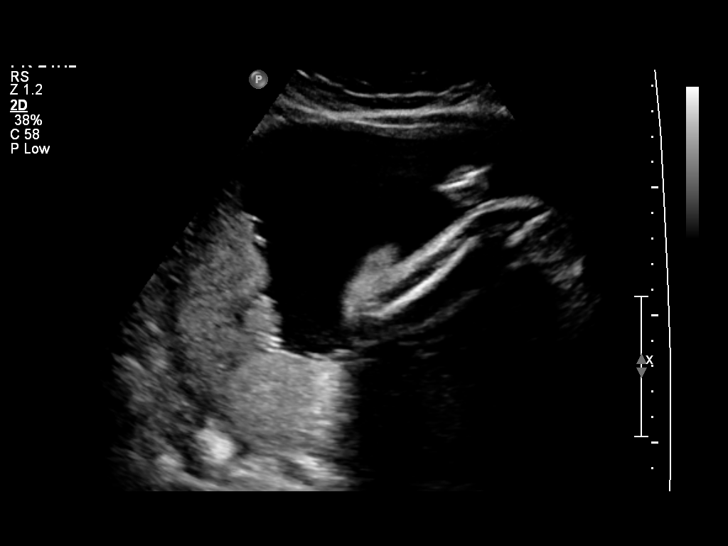
[im 8/40]
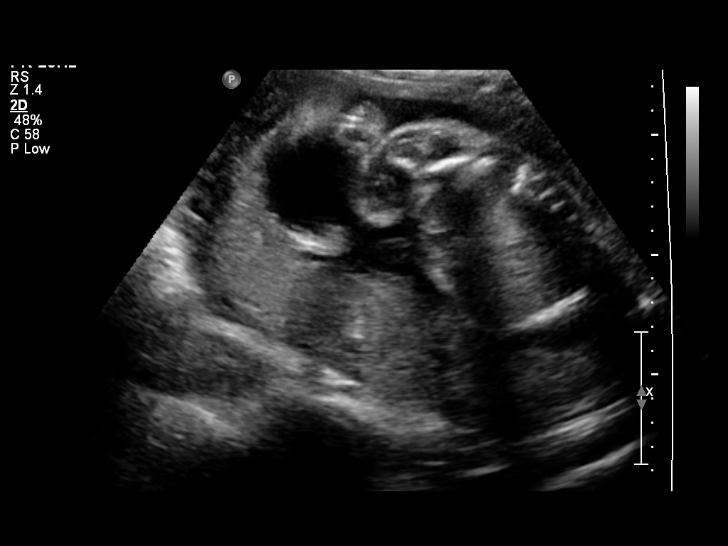
[im 13/40]
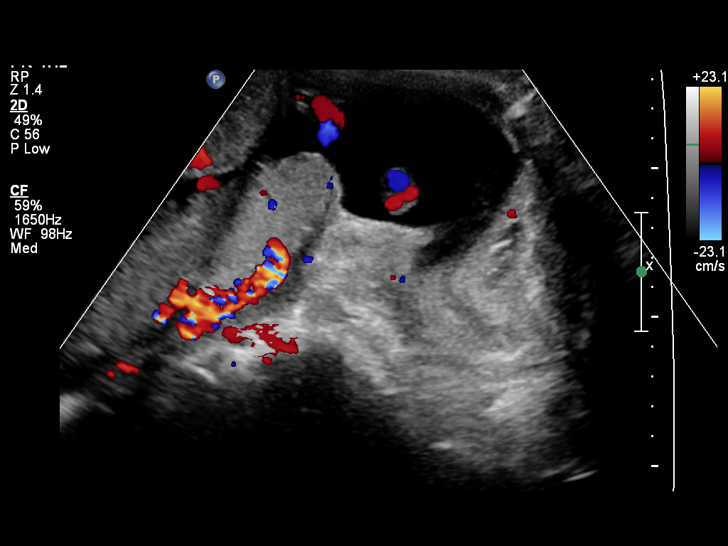
[im 16/40]
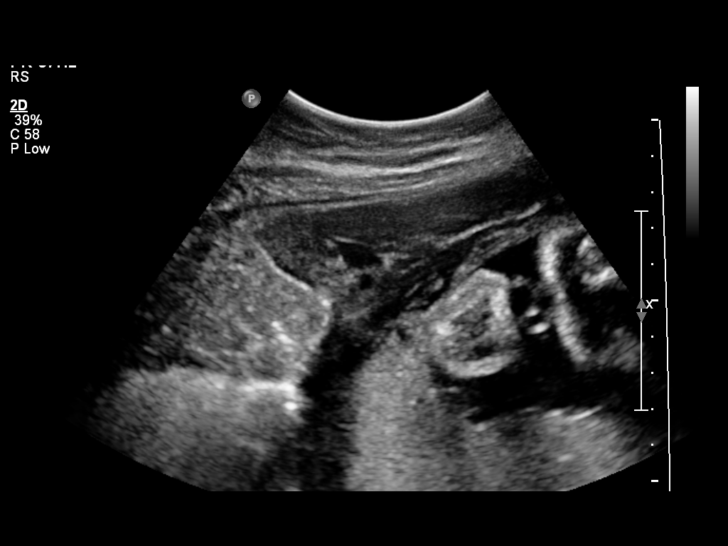
[im 19/40]
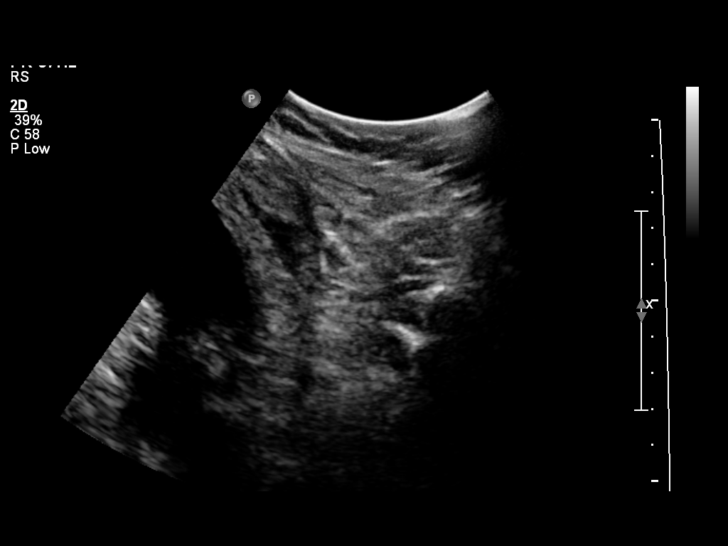
[im 23/40]
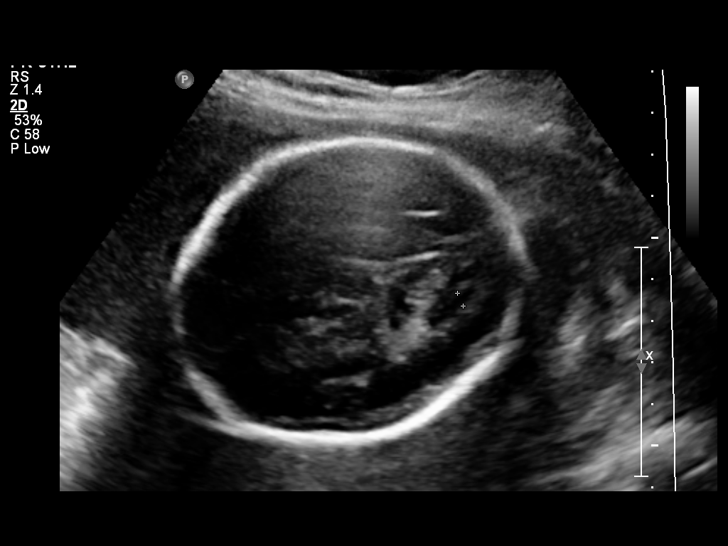
[im 26/40]
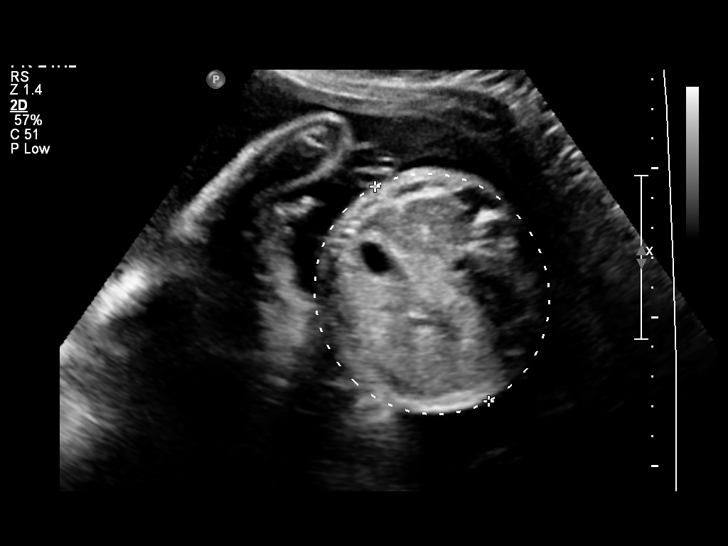
[im 29/40]
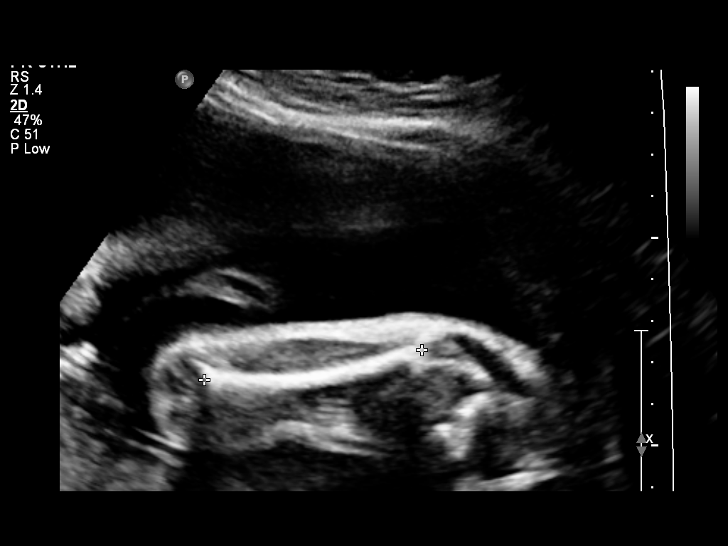
[im 34/40]
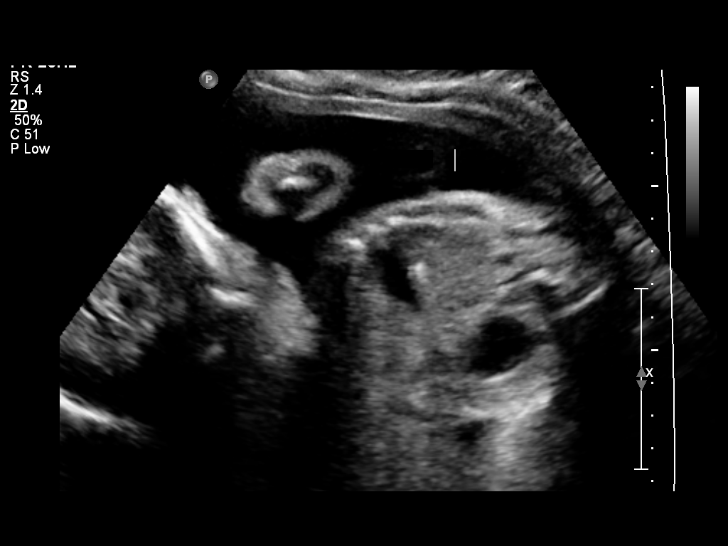
[im 37/40]
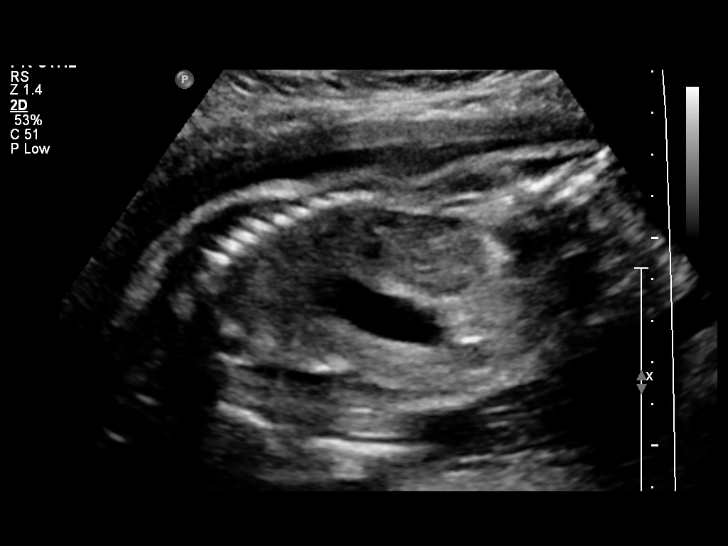
[im 40/40]
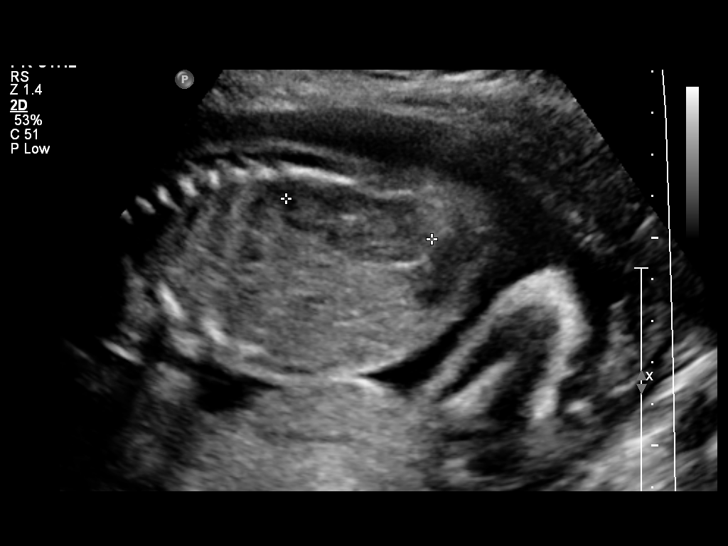

[12 of 28 positions shown; findings below may reference images not displayed]

OBSTETRICS REPORT
                      (Signed Final 02/04/2011 [DATE])

 Order#:         [id number]
Procedures

 US OB FOLLOW UP                                       76816.1
Indications

 Poor obstetric history: Previous preeclampsia /
 eclampsia/gestational HTN
 Previous pre-term deliveries 21weeks
Fetal Evaluation

 Fetal Heart Rate:  146                          bpm
 Cardiac Activity:  Observed
 Presentation:      Breech
 Placenta:          Posterior, above cervical
                    os
 P. Cord            Visualized
 Insertion:

 Amniotic Fluid
 AFI FV:      Subjectively within normal limits
                                             Larg Pckt:     7.3  cm
 RUQ:   7.3     cm
Biometry

 BPD:     71.4  mm     G. Age:  28w 5d                CI:        73.94   70 - 86
                                                      FL/HC:      20.7   18.6 -

 HC:     263.7  mm     G. Age:  28w 5d       67  %    HC/AC:      1.05   1.05 -

 AC:     250.8  mm     G. Age:  29w 2d       91  %    FL/BPD:     76.3   71 - 87
 FL:      54.5  mm     G. Age:  28w 6d       78  %    FL/AC:      21.7   20 - 24

 Est. FW:    8111  gm    2 lb 15 oz      80  %
Gestational Age

 LMP:           29w 0d        Date:  07/16/10                 EDD:   04/22/11
 U/S Today:     28w 6d                                        EDD:   04/23/11
 Best:          27w 2d     Det. By:  Early Ultrasound         EDD:   05/04/11
                                     (09/07/10)
Anatomy
 Cranium:           Appears normal      Aortic Arch:       Previously seen
 Fetal Cavum:       Appears normal      Ductal Arch:       Previously seen
 Ventricles:        Appears normal      Diaphragm:         Appears normal
 Choroid Plexus:    Previously seen     Stomach:           Appears
                                                           normal, left
                                                           sided
 Cerebellum:        Previously seen     Abdomen:           Previously seen
 Posterior Fossa:   Previously seen     Abdominal Wall:    Previously seen
 Nuchal Fold:       Previously seen     Cord Vessels:      Previously seen
 Face:              Profile appears     Kidneys:           Appear normal
                    normal
 Heart:             Appears normal      Bladder:           Appears normal
                    (4 chamber &
                    axis)
 RVOT:              Previously seen     Spine:             Previously seen
 LVOT:              Previously seen     Limbs:             Previously seen

 Other:     Heels and 5th digit prev. visualized. Fetus appears to be
            a female. Technically difficult due to fetal position.
Cervix Uterus Adnexa

 Cervical Length:    3.72     cm

 Cervix:       Normal appearance by transabdominal scan.

 Adnexa:     No abnormality visualized.
Impression

 Single living intrauterine gestation in breech presentation with
 concordant gestational age and fetal indices.  The EFW
 today is at the 80th  percentile.  Normal amniotic fluid volume.

 questions or concerns.

## 2013-02-22 ENCOUNTER — Encounter (HOSPITAL_COMMUNITY): Payer: Self-pay | Admitting: Emergency Medicine

## 2013-02-22 ENCOUNTER — Emergency Department (HOSPITAL_COMMUNITY)
Admission: EM | Admit: 2013-02-22 | Discharge: 2013-02-22 | Disposition: A | Discharge status: Home / Self Care | Payer: Medicaid Other | Attending: Emergency Medicine | Admitting: Emergency Medicine

## 2013-02-22 DIAGNOSIS — R21 Rash and other nonspecific skin eruption: Secondary | ICD-10-CM

## 2013-02-22 DIAGNOSIS — H579 Unspecified disorder of eye and adnexa: Secondary | ICD-10-CM | POA: Insufficient documentation

## 2013-02-22 DIAGNOSIS — I10 Essential (primary) hypertension: Secondary | ICD-10-CM | POA: Insufficient documentation

## 2013-02-22 DIAGNOSIS — Z8719 Personal history of other diseases of the digestive system: Secondary | ICD-10-CM | POA: Insufficient documentation

## 2013-02-22 DIAGNOSIS — H5789 Other specified disorders of eye and adnexa: Secondary | ICD-10-CM

## 2013-02-22 DIAGNOSIS — Z862 Personal history of diseases of the blood and blood-forming organs and certain disorders involving the immune mechanism: Secondary | ICD-10-CM | POA: Insufficient documentation

## 2013-02-22 DIAGNOSIS — Z8751 Personal history of pre-term labor: Secondary | ICD-10-CM | POA: Insufficient documentation

## 2013-02-22 DIAGNOSIS — Z8669 Personal history of other diseases of the nervous system and sense organs: Secondary | ICD-10-CM | POA: Insufficient documentation

## 2013-02-22 DIAGNOSIS — H0289 Other specified disorders of eyelid: Secondary | ICD-10-CM

## 2013-02-22 MED ORDER — FLUORESCEIN SODIUM 1 MG OP STRP
1.0000 | ORAL_STRIP | Freq: Once | OPHTHALMIC | Status: DC
  Filled 2013-02-22: qty 1

## 2013-02-22 MED ORDER — ERYTHROMYCIN 5 MG/GM OP OINT: TOPICAL_OINTMENT | Freq: Four times a day (QID) | OPHTHALMIC | Status: DC

## 2013-02-22 MED ORDER — PREDNISONE 20 MG PO TABS: 20.0000 mg | ORAL_TABLET | Freq: Every day | ORAL | Status: DC

## 2013-02-22 MED ORDER — PROPARACAINE HCL 0.5 % OP SOLN
1.0000 [drp] | Freq: Once | OPHTHALMIC | Status: DC
  Filled 2013-02-22: qty 15

## 2013-02-22 NOTE — ED Notes (Signed)
Pt c/o eye redness and pain since Saturday.

## 2013-02-22 NOTE — ED Provider Notes (Signed)
CSN: [id number]     Arrival date & time 02/22/13  1739 History  This chart was scribed for Junius Finner, PA, working with Audree Camel, MD, by Ardelia Mems ED Scribe. This patient was seen in room WTR5/WTR5 and the patient's care was started at 8:07 PM.  Chief Complaint  Patient presents with  . Eye Problem    The history is provided by the patient. No language interpreter was used.    HPI Comments: Renee Suarez is a 22 y.o. female who presents to the Emergency Department complaining of "burning" bilateral eye pain, right worse than left, over the past 4 days. She states that her symptoms onset the morning after having her eyebrows waxed. She states that she has had this procedure performed at the same location before without similar symptoms but states it has been over 77mo since she has been there. She reports associated bilateral eye redness and itching. She also reports crusty eye discharge and blurred vision, but only in the right eye. She states that she does not wear contacts. She states that she has tried Clear Eyes, Benadryl and has applied warm compresses to her eyes, all without relief. She states that her only known medication allergy is Percocet.   Past Medical History  Diagnosis Date  . Anemia   . Preterm labor   . Hypertension   . Pregnancy induced hypertension   . Seizures     eclampsia with first pregnancy  . GERD (gastroesophageal reflux disease)     post partum 1st preg.   Past Surgical History  Procedure Laterality Date  . Cesarean section    . Cholecystectomy    . Cesarean section  04/25/2011    Procedure: CESAREAN SECTION;  Surgeon: Tereso Newcomer, MD;  Location: [facility];  Service: Gynecology;  Laterality: N/A;  Repeat C/Section   Family History  Problem Relation Age of Onset  . Mental illness Brother   . Mental retardation Brother   . Hypertension Paternal Grandmother   . Diabetes Paternal Grandmother    History  Substance Use Topics  .  Smoking status: Never Smoker   . Smokeless tobacco: Never Used  . Alcohol Use: No   OB History   Grav Para Term Preterm Abortions TAB SAB Ect Mult Living   2 2 1 1  0 0 0 0 0 2     Review of Systems  Eyes: Positive for pain, discharge (right eye), redness, itching and visual disturbance (blurred vision in right eye).  All other systems reviewed and are negative.   Allergies  Percocet  Home Medications   Current Outpatient Rx  Name  Route  Sig  Dispense  Refill  . diphenhydrAMINE (BENADRYL) 25 MG tablet   Oral   Take 25 mg by mouth at bedtime as needed for itching or allergies.         [name] erythromycin ophthalmic ointment   Right Eye   Place into the right eye every 6 (six) hours. Place 1/2 inch ribbon of ointment in the affected eye 4 times a day   1 g   1   . predniSONE (DELTASONE) 20 MG tablet   Oral   Take 1 tablet (20 mg total) by mouth daily.   5 tablet   0    Triage Vitals: BP 109/74  Pulse 76  Temp(Src) 98.9 F (37.2 C) (Oral)  Resp 18  SpO2 97%  Physical Exam  Nursing note and vitals reviewed. Constitutional: She is oriented to person,  place, and time. She appears well-developed and well-nourished. No distress.  HENT:  Head: Normocephalic and atraumatic.  Eyes: EOM are normal. Pupils are equal, round, and reactive to light. Lids are everted and swept, no foreign bodies found. Right eye exhibits discharge (clear). No foreign body present in the right eye. Left eye exhibits no discharge. No foreign body present in the left eye. Right conjunctiva is injected.  Slit lamp exam:      The right eye shows no corneal abrasion, no corneal flare, no corneal ulcer, no foreign body, no hyphema, no hypopyon and no fluorescein uptake.       The left eye shows no corneal abrasion, no corneal flare, no corneal ulcer, no foreign body, no hyphema, no hypopyon and no fluorescein uptake.  Left eye 20/20. Right eye 20/30. Both eyes 20/20. Papular erythemic rash along left and  right eyebrow lines. No active drainage. No periorbital edema or erythema or induration.  Neck: Neck supple. No tracheal deviation present.  Cardiovascular: Normal rate.   Pulmonary/Chest: Effort normal. No respiratory distress.  Musculoskeletal: Normal range of motion.  Neurological: She is alert and oriented to person, place, and time.  Skin: Skin is warm and dry.  Psychiatric: She has a normal mood and affect. Her behavior is normal.    ED Course  Procedures (including critical care time)  DIAGNOSTIC STUDIES: Oxygen Saturation is 97% on RA, normal by my interpretation.    COORDINATION OF CARE: 8:13 PM- Pt advised of plan for treatment and pt agrees.  8:30 PM- Observed for corneal abrasions or foreign bodies, and none were found.   Medications - No data to display Labs Review Labs Reviewed - No data to display Imaging Review No results found.  EKG Interpretation   None       MDM   1. Irritation of right eye   2. Irritation of eyelid   3. Rash    Pt presenting with right eye irritation and bilateral eyelid irritation after having eyebrows waxed 4 days ago.  Not concerned for periorbital cellulitis.  No corneal abrasion or abscess seen on fluoroscein exam.   Rx: erythromycin ointment and 5 day course of 20mg  prednisone to help with itching and swelling around eyebrows.  Advised to f/u with ophthalmology, Dr. Luciana Axe, if symptoms not improving in 3-4 days, sooner if symptoms worsen. Pt verbalized understanding and agreement with tx plan.   I personally performed the services described in this documentation, which was scribed in my presence. The recorded information has been reviewed and is accurate.    Junius Finner, PA-C 02/23/13 0202

## 2013-02-23 NOTE — ED Provider Notes (Signed)
Medical screening examination/treatment/procedure(s) were performed by non-physician practitioner and as supervising physician I was immediately available for consultation/collaboration.  EKG Interpretation   None         Audree Camel, MD 02/23/13 1116

## 2013-11-11 ENCOUNTER — Emergency Department (INDEPENDENT_AMBULATORY_CARE_PROVIDER_SITE_OTHER)
Admission: EM | Admit: 2013-11-11 | Discharge: 2013-11-11 | Disposition: A | Discharge status: Home / Self Care | Payer: Self-pay | Source: Home / Self Care

## 2013-11-11 ENCOUNTER — Encounter (HOSPITAL_COMMUNITY): Payer: Self-pay | Admitting: Emergency Medicine

## 2013-11-11 DIAGNOSIS — L739 Follicular disorder, unspecified: Secondary | ICD-10-CM

## 2013-11-11 DIAGNOSIS — L738 Other specified follicular disorders: Secondary | ICD-10-CM

## 2013-11-11 DIAGNOSIS — L678 Other hair color and hair shaft abnormalities: Secondary | ICD-10-CM

## 2013-11-11 MED ORDER — SULFAMETHOXAZOLE-TRIMETHOPRIM 800-160 MG PO TABS: 2.0000 | ORAL_TABLET | Freq: Two times a day (BID) | ORAL | Status: DC

## 2013-11-11 NOTE — ED Provider Notes (Signed)
Renee Suarez is a 23 y.o. female who presents to Urgent Care today for abscess. Patient has developed several painful tender abscesses or ingrown hairs in her buttocks and labia. This is been present for the last week or so. She's tried some over-the-counter creams which have helped some. She denies any fevers or chills nausea vomiting or diarrhea.   Past Medical History  Diagnosis Date  . Anemia   . Preterm labor   . Hypertension   . Pregnancy induced hypertension   . Seizures     eclampsia with first pregnancy  . GERD (gastroesophageal reflux disease)     post partum 1st preg.   History  Substance Use Topics  . Smoking status: Never Smoker   . Smokeless tobacco: Never Used  . Alcohol Use: No   ROS as above Medications: No current facility-administered medications for this encounter.   Current Outpatient Prescriptions  Medication Sig Dispense Refill  . sulfamethoxazole-trimethoprim (SEPTRA DS) 800-160 MG per tablet Take 2 tablets by mouth 2 (two) times daily.  28 tablet  0    Exam:  BP 120/88  Pulse 90  Temp(Src) 98.7 F (37.1 C) (Oral)  Resp 16  SpO2 98% Gen: Well NAD Skin: Erythematous indurated small papule right buttocks without fluctuance present. 2 tiny erythematous and minutely indurated papules left labia present/  No significant fluctuance or surrounding extensive erythema or induration  No results found for this or any previous visit (from the past 24 hour(s)). No results found.  Assessment and Plan: 23 y.o. female with folliculitis. No abscesses or drainable this time. Trial of Septra antibiotics.  The patient uses Mirena for contraception  Discussed warning signs or symptoms. Please see discharge instructions. Patient expresses understanding.   This note was created using Conservation officer, historic buildingsDragon voice recognition software. Any transcription errors are unintended.    Rodolph BongEvan S Jeriah Corkum, MD 11/11/13 1330

## 2013-11-11 NOTE — ED Notes (Signed)
Boil on buttocks. 

## 2013-11-11 NOTE — Discharge Instructions (Signed)
Thank you for coming in today. Call or go to the emergency room if you get worse, have trouble breathing, have chest pains, or palpitations.    Folliculitis  Folliculitis is redness, soreness, and swelling (inflammation) of the hair follicles. This condition can occur anywhere on the body. People with weakened immune systems, diabetes, or obesity have a greater risk of getting folliculitis. CAUSES  Bacterial infection. This is the most common cause.  Fungal infection.  Viral infection.  Contact with certain chemicals, especially oils and tars. Long-term folliculitis can result from bacteria that live in the nostrils. The bacteria may trigger multiple outbreaks of folliculitis over time. SYMPTOMS Folliculitis most commonly occurs on the scalp, thighs, legs, back, buttocks, and areas where hair is shaved frequently. An early sign of folliculitis is a small, white or yellow, pus-filled, itchy lesion (pustule). These lesions appear on a red, inflamed follicle. They are usually less than 0.2 inches (5 mm) wide. When there is an infection of the follicle that goes deeper, it becomes a boil or furuncle. A group of closely packed boils creates a larger lesion (carbuncle). Carbuncles tend to occur in hairy, sweaty areas of the body. DIAGNOSIS  Your caregiver can usually tell what is wrong by doing a physical exam. A sample may be taken from one of the lesions and tested in a lab. This can help determine what is causing your folliculitis. TREATMENT  Treatment may include:  Applying warm compresses to the affected areas.  Taking antibiotic medicines orally or applying them to the skin.  Draining the lesions if they contain a large amount of pus or fluid.  Laser hair removal for cases of long-lasting folliculitis. This helps to prevent regrowth of the hair. HOME CARE INSTRUCTIONS  Apply warm compresses to the affected areas as directed by your caregiver.  If antibiotics are prescribed, take  them as directed. Finish them even if you start to feel better.  You may take over-the-counter medicines to relieve itching.  Do not shave irritated skin.  Follow up with your caregiver as directed. SEEK IMMEDIATE MEDICAL CARE IF:   You have increasing redness, swelling, or pain in the affected area.  You have a fever. MAKE SURE YOU:  Understand these instructions.  Will watch your condition.  Will get help right away if you are not doing well or get worse. Document Released: 06/23/2001 Document Revised: 10/14/2011 Document Reviewed: 07/15/2011 [facility] Patient Information 2015 [place], [place]. This information is not intended to replace advice given to you by your health care provider. Make sure you discuss any questions you have with your health care provider.

## 2014-02-27 ENCOUNTER — Encounter (HOSPITAL_COMMUNITY): Payer: Self-pay | Admitting: Emergency Medicine

## 2014-12-07 ENCOUNTER — Ambulatory Visit: Payer: Medicaid Other | Admitting: Certified Nurse Midwife

## 2015-02-15 ENCOUNTER — Encounter (HOSPITAL_COMMUNITY): Payer: Self-pay | Admitting: *Deleted

## 2015-02-15 ENCOUNTER — Inpatient Hospital Stay (HOSPITAL_COMMUNITY)
Admission: AD | Admit: 2015-02-15 | Discharge: 2015-02-15 | Disposition: A | Discharge status: Home / Self Care | Payer: Medicaid Other | Source: Ambulatory Visit | Attending: Obstetrics & Gynecology | Admitting: Obstetrics & Gynecology

## 2015-02-15 DIAGNOSIS — O99331 Smoking (tobacco) complicating pregnancy, first trimester: Secondary | ICD-10-CM | POA: Diagnosis not present

## 2015-02-15 DIAGNOSIS — F17210 Nicotine dependence, cigarettes, uncomplicated: Secondary | ICD-10-CM | POA: Diagnosis not present

## 2015-02-15 DIAGNOSIS — R300 Dysuria: Secondary | ICD-10-CM | POA: Diagnosis present

## 2015-02-15 DIAGNOSIS — B373 Candidiasis of vulva and vagina: Secondary | ICD-10-CM

## 2015-02-15 DIAGNOSIS — B3731 Acute candidiasis of vulva and vagina: Secondary | ICD-10-CM

## 2015-02-15 DIAGNOSIS — O98811 Other maternal infectious and parasitic diseases complicating pregnancy, first trimester: Secondary | ICD-10-CM | POA: Insufficient documentation

## 2015-02-15 DIAGNOSIS — Z3A01 Less than 8 weeks gestation of pregnancy: Secondary | ICD-10-CM | POA: Insufficient documentation

## 2015-02-15 DIAGNOSIS — B379 Candidiasis, unspecified: Secondary | ICD-10-CM | POA: Insufficient documentation

## 2015-02-15 HISTORY — DX: Eclampsia, unspecified as to time period: O15.9

## 2015-02-15 HISTORY — DX: Headache, unspecified: R51.9

## 2015-02-15 HISTORY — DX: Headache: R51

## 2015-02-15 LAB — URINALYSIS, ROUTINE W REFLEX MICROSCOPIC
BILIRUBIN URINE: NEGATIVE
Glucose, UA: NEGATIVE mg/dL
HGB URINE DIPSTICK: NEGATIVE
KETONES UR: NEGATIVE mg/dL
Leukocytes, UA: NEGATIVE
NITRITE: NEGATIVE
PROTEIN: NEGATIVE mg/dL
SPECIFIC GRAVITY, URINE: 1.015 (ref 1.005–1.030)
UROBILINOGEN UA: 0.2 mg/dL (ref 0.0–1.0)
pH: 5.5 (ref 5.0–8.0)

## 2015-02-15 LAB — WET PREP, GENITAL
Clue Cells Wet Prep HPF POC: NONE SEEN
Trich, Wet Prep: NONE SEEN
YEAST WET PREP: NONE SEEN

## 2015-02-15 LAB — POCT PREGNANCY, URINE: PREG TEST UR: POSITIVE — AB

## 2015-02-15 MED ORDER — TERCONAZOLE 0.4 % VA CREA: 1.0000 | TOPICAL_CREAM | Freq: Every day | VAGINAL | Status: DC

## 2015-02-15 NOTE — Discharge Instructions (Signed)

## 2015-02-15 NOTE — MAU Note (Signed)
Pt c/o pain and burining when she urinates for about 2-3 days.

## 2015-02-15 NOTE — MAU Note (Signed)
Urinary symptoms started 4 days ago

## 2015-02-15 NOTE — MAU Provider Note (Signed)
History     CSN: [id number]  Arrival date and time: 02/15/15 7829   First Provider Initiated Contact with Patient 02/15/15 [phone]      Chief Complaint  Patient presents with  . Dysuria   HPI   Renee Suarez is a 24 y.o. female [phone] at [redacted]w[redacted]d presenting with + bladder pain, the patient feels she has a UTI.  She just feels comfortable in her vaginal area, with occasional itching.   Denies abdominal pain and bleeding. The patient was unaware that she was pregnant.   OB History    Gravida Para Term Preterm AB TAB SAB Ectopic Multiple Living   0 0 0 0 0 2      Past Medical History  Diagnosis Date  . Anemia   . Preterm labor   . Hypertension   . Seizures (HCC)     eclampsia with first pregnancy  . GERD (gastroesophageal reflux disease)     post partum 1st preg.  [name] Headache   . Pregnancy induced hypertension   . Eclampsia     with 1st preg  . Infection     UTI    Past Surgical History  Procedure Laterality Date  . Cesarean section    . Cholecystectomy    . Cesarean section  04/25/2011    Procedure: CESAREAN SECTION;  Surgeon: Tereso Newcomer, MD;  Location: [facility];  Service: Gynecology;  Laterality: N/A;  Repeat C/Section    Family History  Problem Relation Age of Onset  . Mental illness Brother   . Mental retardation Brother   . Hypertension Paternal Grandmother   . Diabetes Paternal Grandmother   . Heart disease Paternal Grandmother   . Diabetes Mother   . Cancer Paternal Grandfather     pancreatic    Social History  Substance Use Topics  . Smoking status: Current Some Day Smoker    Types: Cigarettes  . Smokeless tobacco: Never Used  . Alcohol Use: No    Allergies:  Allergies  Allergen Reactions  . Percocet [Oxycodone-Acetaminophen] Hives    Prescriptions prior to admission  Medication Sig Dispense Refill Last Dose  . sulfamethoxazole-trimethoprim (SEPTRA DS) 800-160 MG per tablet Take 2 tablets by mouth 2 (two) times daily. 28  tablet 0    Results for orders placed or performed during the hospital encounter of 02/15/15 (from the past 24 hour(s))  Urinalysis, Routine w reflex microscopic (not at [facility])     Status: None   Collection Time: 02/15/15  9:30 AM  Result Value Ref Range   Color, Urine YELLOW YELLOW   APPearance CLEAR CLEAR   Specific Gravity, Urine 1.015 1.005 - 1.030   pH 5.5 5.0 - 8.0   Glucose, UA NEGATIVE NEGATIVE mg/dL   Hgb urine dipstick NEGATIVE NEGATIVE   Bilirubin Urine NEGATIVE NEGATIVE   Ketones, ur NEGATIVE NEGATIVE mg/dL   Protein, ur NEGATIVE NEGATIVE mg/dL   Urobilinogen, UA 0.2 0.0 - 1.0 mg/dL   Nitrite NEGATIVE NEGATIVE   Leukocytes, UA NEGATIVE NEGATIVE  Pregnancy, urine POC     Status: Abnormal   Collection Time: 02/15/15  9:37 AM  Result Value Ref Range   Preg Test, Ur POSITIVE (A) NEGATIVE  Wet prep, genital     Status: Abnormal   Collection Time: 02/15/15 10:10 AM  Result Value Ref Range   Yeast Wet Prep HPF POC NONE SEEN NONE SEEN   Trich, Wet Prep NONE SEEN NONE SEEN   Clue Cells Wet Prep  HPF POC NONE SEEN NONE SEEN   WBC, Wet Prep HPF POC FEW (A) NONE SEEN     Review of Systems  Constitutional: Negative for fever and chills.  Gastrointestinal: Negative for nausea, vomiting and abdominal pain.  Genitourinary: Positive for urgency. Negative for dysuria, frequency, hematuria and flank pain.       Denies vaginal bleeding    Physical Exam   Blood pressure 117/68, pulse 95, temperature 98.1 F (36.7 C), temperature source Oral, resp. rate 18, last menstrual period 12/12/2014.  Physical Exam  Constitutional: She is oriented to person, place, and time. She appears well-developed and well-nourished. No distress.  HENT:  Head: Normocephalic.  Eyes: Pupils are equal, round, and reactive to light.  Neck: Neck supple.  Respiratory: Effort normal.  GI: Soft. She exhibits no distension. There is no tenderness. There is no rebound, no guarding and no CVA tenderness.   Genitourinary: Vaginal discharge found.  Speculum exam: Vagina - Small amount of creamy, thick, clumpy, white discharge, no odor Cervix - No contact bleeding Bimanual exam: Cervix closed, no CMT  Uterus non tender,enlarged  Adnexa non tender, no masses bilaterally GC/Chlam, wet prep done Chaperone present for exam.  Musculoskeletal: Normal range of motion.  Neurological: She is alert and oriented to person, place, and time.  Skin: Skin is warm. She is not diaphoretic.  Psychiatric: Her behavior is normal.    MAU Course  Procedures  None  MDM   Assessment and Plan   A:  1. Yeast vaginitis; presumed    P:  Discharge home in stable condition Return to MAU for emergencies First trimester warning signs discussed  Start prenatal ASAP  Prenatal vitamins daily.       Duane LopeJennifer I Radley Teston, NP 02/15/2015  7:50 PM

## 2015-02-16 LAB — GC/CHLAMYDIA PROBE AMP (~~LOC~~) NOT AT ARMC
Chlamydia: NEGATIVE
Neisseria Gonorrhea: NEGATIVE

## 2015-02-16 LAB — HIV ANTIBODY (ROUTINE TESTING W REFLEX): HIV SCREEN 4TH GENERATION: NONREACTIVE

## 2015-03-08 ENCOUNTER — Encounter: Payer: Self-pay | Admitting: Obstetrics & Gynecology

## 2015-03-08 ENCOUNTER — Encounter: Payer: Medicaid Other | Admitting: Obstetrics & Gynecology

## 2015-12-20 ENCOUNTER — Encounter (HOSPITAL_COMMUNITY): Payer: Self-pay | Admitting: *Deleted

## 2015-12-20 ENCOUNTER — Inpatient Hospital Stay (HOSPITAL_COMMUNITY)
Admission: AD | Admit: 2015-12-20 | Discharge: 2015-12-20 | Disposition: A | Discharge status: Home / Self Care | Payer: Medicaid Other | Source: Ambulatory Visit | Attending: Obstetrics and Gynecology | Admitting: Obstetrics and Gynecology

## 2015-12-20 DIAGNOSIS — K219 Gastro-esophageal reflux disease without esophagitis: Secondary | ICD-10-CM | POA: Diagnosis not present

## 2015-12-20 DIAGNOSIS — O99611 Diseases of the digestive system complicating pregnancy, first trimester: Secondary | ICD-10-CM | POA: Diagnosis not present

## 2015-12-20 DIAGNOSIS — R112 Nausea with vomiting, unspecified: Secondary | ICD-10-CM | POA: Insufficient documentation

## 2015-12-20 DIAGNOSIS — Z3A01 Less than 8 weeks gestation of pregnancy: Secondary | ICD-10-CM | POA: Insufficient documentation

## 2015-12-20 DIAGNOSIS — O2341 Unspecified infection of urinary tract in pregnancy, first trimester: Secondary | ICD-10-CM | POA: Diagnosis not present

## 2015-12-20 DIAGNOSIS — O219 Vomiting of pregnancy, unspecified: Secondary | ICD-10-CM | POA: Diagnosis not present

## 2015-12-20 DIAGNOSIS — R42 Dizziness and giddiness: Secondary | ICD-10-CM | POA: Diagnosis present

## 2015-12-20 LAB — URINALYSIS, ROUTINE W REFLEX MICROSCOPIC
BILIRUBIN URINE: NEGATIVE
GLUCOSE, UA: NEGATIVE mg/dL
HGB URINE DIPSTICK: NEGATIVE
KETONES UR: NEGATIVE mg/dL
Leukocytes, UA: NEGATIVE
NITRITE: POSITIVE — AB
Protein, ur: NEGATIVE mg/dL
Specific Gravity, Urine: 1.015 (ref 1.005–1.030)
pH: 7 (ref 5.0–8.0)

## 2015-12-20 LAB — URINE MICROSCOPIC-ADD ON: RBC / HPF: NONE SEEN RBC/hpf (ref 0–5)

## 2015-12-20 LAB — POCT PREGNANCY, URINE: Preg Test, Ur: POSITIVE — AB

## 2015-12-20 MED ORDER — PROMETHAZINE HCL 25 MG RE SUPP
25.0000 mg | Freq: Four times a day (QID) | RECTAL | Status: DC | PRN
Start: 2015-12-20 — End: 2015-12-20
  Administered 2015-12-20: 25 mg via RECTAL
  Filled 2015-12-20: qty 1

## 2015-12-20 MED ORDER — METOCLOPRAMIDE HCL 10 MG PO TABS
10.0000 mg | ORAL_TABLET | Freq: Three times a day (TID) | ORAL | Status: DC | PRN
  Filled 2015-12-20: qty 1

## 2015-12-20 MED ORDER — PROMETHAZINE HCL 25 MG RE SUPP: 25.0000 mg | Freq: Four times a day (QID) | RECTAL | 0 refills | Status: DC | PRN

## 2015-12-20 MED ORDER — PROMETHAZINE HCL 25 MG PO TABS: 12.5000 mg | ORAL_TABLET | Freq: Four times a day (QID) | ORAL | 1 refills | Status: DC | PRN

## 2015-12-20 MED ORDER — NITROFURANTOIN MONOHYD MACRO 100 MG PO CAPS: 100.0000 mg | ORAL_CAPSULE | Freq: Two times a day (BID) | ORAL | 0 refills | Status: AC

## 2015-12-20 NOTE — MAU Note (Signed)
C/o N&V and dizziness for past 3 days;

## 2015-12-20 NOTE — Discharge Instructions (Signed)
Morning Sickness °Morning sickness is when you feel sick to your stomach (nauseous) during pregnancy. You may feel sick to your stomach and throw up (vomit). You may feel sick in the morning, but you can feel this way any time of day. Some women feel very sick to their stomach and cannot stop throwing up (hyperemesis gravidarum). °HOME CARE °· Only take medicines as told by your doctor. °· Take multivitamins as told by your doctor. Taking multivitamins before getting pregnant can stop or lessen the harshness of morning sickness. °· Eat dry toast or unsalted crackers before getting out of bed. °· Eat 5 to 6 small meals a day. °· Eat dry and bland foods like rice and baked potatoes. °· Do not drink liquids with meals. Drink between meals. °· Do not eat greasy, fatty, or spicy foods. °· Have someone cook for you if the smell of food causes you to feel sick or throw up. °· If you feel sick to your stomach after taking prenatal vitamins, take them at night or with a snack. °· Eat protein when you need a snack (nuts, yogurt, cheese). °· Eat unsweetened gelatins for dessert. °· Wear a bracelet used for sea sickness (acupressure wristband). °· Go to a doctor that puts thin needles into certain body points (acupuncture) to improve how you feel. °· Do not smoke. °· Use a humidifier to keep the air in your house free of odors. °· Get lots of fresh air. °GET HELP IF: °· You need medicine to feel better. °· You feel dizzy or lightheaded. °· You are losing weight. °GET HELP RIGHT AWAY IF:  °· You feel very sick to your stomach and cannot stop throwing up. °· You pass out (faint). °MAKE SURE YOU: °· Understand these instructions. °· Will watch your condition. °· Will get help right away if you are not doing well or get worse. °  °This information is not intended to replace advice given to you by your health care provider. Make sure you discuss any questions you have with your health care provider. °  °Document Released: 05/22/2004  Document Revised: 05/05/2014 Document Reviewed: 09/29/2012 °Elsevier Interactive Patient Education ©2016 Elsevier Inc. ° °Pregnancy and Urinary Tract Infection °A urinary tract infection (UTI) is a bacterial infection of the urinary tract. Infection of the urinary tract can include the ureters, kidneys (pyelonephritis), bladder (cystitis), and urethra (urethritis). All pregnant women should be screened for bacteria in the urinary tract. Identifying and treating a UTI will decrease the risk of preterm labor and developing more serious infections in both the mother and baby. °CAUSES °Bacteria germs cause almost all UTIs.  °RISK FACTORS °Many factors can increase your chances of getting a UTI during pregnancy. These include: °· Having a short urethra. °· Poor toilet and hygiene habits. °· Sexual intercourse. °· Blockage of urine along the urinary tract. °· Problems with the pelvic muscles or nerves. °· Diabetes. °· Obesity. °· Bladder problems after having several children. °· Previous history of UTI. °SIGNS AND SYMPTOMS  °· Pain, burning, or a stinging feeling when urinating. °· Suddenly feeling the need to urinate right away (urgency). °· Loss of bladder control (urinary incontinence). °· Frequent urination, more than is common with pregnancy. °· Lower abdominal or back discomfort. °· Cloudy urine. °· Blood in the urine (hematuria). °· Fever.  °When the kidneys are infected, the symptoms may be: °· Back pain. °· Flank pain on the right side more so than the left. °· Fever. °· Chills. °· Nausea. °·   Vomiting. °DIAGNOSIS  °A urinary tract infection is usually diagnosed through urine tests. Additional tests and procedures are sometimes done. These may include: °· Ultrasound exam of the kidneys, ureters, bladder, and urethra. °· Looking in the bladder with a lighted tube (cystoscopy). °TREATMENT °Typically, UTIs can be treated with antibiotic medicines.  °HOME CARE INSTRUCTIONS  °· Only take over-the-counter or  prescription medicines as directed by your health care provider. If you were prescribed antibiotics, take them as directed. Finish them even if you start to feel better. °· Drink enough fluids to keep your urine clear or pale yellow. °· Do not have sexual intercourse until the infection is gone and your health care provider says it is okay. °· Make sure you are tested for UTIs throughout your pregnancy. These infections often come back.  °Preventing a UTI in the Future °· Practice good toilet habits. Always wipe from front to back. Use the tissue only once. °· Do not hold your urine. Empty your bladder as soon as possible when the urge comes. °· Do not douche or use deodorant sprays. °· Wash with soap and warm water around the genital area and the anus. °· Empty your bladder before and after sexual intercourse. °· Wear underwear with a cotton crotch. °· Avoid caffeine and carbonated drinks. They can irritate the bladder. °· Drink cranberry juice or take cranberry pills. This may decrease the risk of getting a UTI. °· Do not drink alcohol. °· Keep all your appointments and tests as scheduled.  °SEEK MEDICAL CARE IF:  °· Your symptoms get worse. °· You are still having fevers 2 or more days after treatment begins. °· You have a rash. °· You feel that you are having problems with medicines prescribed. °· You have abnormal vaginal discharge. °SEEK IMMEDIATE MEDICAL CARE IF:  °· You have back or flank pain. °· You have chills. °· You have blood in your urine. °· You have nausea and vomiting. °· You have contractions of your uterus. °· You have a gush of fluid from the vagina. °MAKE SURE YOU: °· Understand these instructions.   °· Will watch your condition.   °· Will get help right away if you are not doing well or get worse.   °  °This information is not intended to replace advice given to you by your health care provider. Make sure you discuss any questions you have with your health care provider. °  °Document Released:  08/09/2010 Document Revised: 02/02/2013 Document Reviewed: 11/11/2012 °Elsevier Interactive Patient Education ©2016 Elsevier Inc. ° °

## 2015-12-20 NOTE — MAU Provider Note (Signed)
History     CSN: [id number]  Arrival date and time: 12/20/15 0820   First Provider Initiated Contact with Patient 12/20/15 [phone]      Chief Complaint  Patient presents with  . Dizziness  . Nausea  . Emesis   [id number] @[redacted]w[redacted]d  by LMP c/o N/V x3 days. She reports the onset is first thing after awaking and several episodes of vomiting then as the day goes on it resolves and she can tolerate food and fluids. She has tried ingesting salt for the symptoms with no relief. She denies VB or pain. She reports dizziness when she vomits. She denies fever, constipation, and diarrhea. She admits to pressure and aching when urinating, she is unsure of onset. She also admits to urinary urgency and frequency x1 week. This pregnancy is unplanned and she reports using Plan B after unprotected IC but she is happy and plans to continue.   OB History    Gravida Para Term Preterm AB Living   4 2 1 1  0 2   SAB TAB Ectopic Multiple Live Births   0 0 0 0 2      Past Medical History:  Diagnosis Date  . Anemia   . Eclampsia    with 1st preg  . GERD (gastroesophageal reflux disease)    post partum 1st preg.  [name]. Headache   . Hypertension   . Infection    UTI  . Pregnancy induced hypertension   . Preterm labor   . Seizures (HCC)    eclampsia with first pregnancy    Past Surgical History:  Procedure Laterality Date  . CESAREAN SECTION    . CESAREAN SECTION  04/25/2011   Procedure: CESAREAN SECTION;  Surgeon: [name], MD;  Location: [facility];  Service: Gynecology;  Laterality: N/A;  Repeat C/Section  . CHOLECYSTECTOMY      Family History  Problem Relation Age of Onset  . Hypertension Paternal Grandmother   . Diabetes Paternal Grandmother   . Heart disease Paternal Grandmother   . Diabetes Mother   . Cancer Paternal Grandfather     pancreatic  . Mental illness Brother   . Mental retardation Brother     Social History  Substance Use Topics  . Smoking status: Never Smoker  . Smokeless  tobacco: Never Used  . Alcohol use No    Allergies:  Allergies  Allergen Reactions  . Percocet [Oxycodone-Acetaminophen] Hives    Prescriptions Prior to Admission  Medication Sig Dispense Refill Last Dose  . ibuprofen (ADVIL,MOTRIN) 200 MG tablet Take 200 mg by mouth every 6 (six) hours as needed for headache.   Past Month at Unknown time    Review of Systems  Constitutional: Negative.  Negative for fever.  Gastrointestinal: Positive for nausea and vomiting. Negative for abdominal pain, constipation and diarrhea.  Genitourinary: Positive for dysuria (pressure/aching), frequency and urgency. Negative for hematuria.  Neurological: Positive for dizziness.   Physical Exam   Blood pressure 107/69, pulse 65, temperature 98.9 F (37.2 C), temperature source Oral, resp. rate 18, last menstrual period 11/20/2015, unknown if currently breastfeeding.  Physical Exam  Constitutional: She is oriented to person, place, and time. She appears well-developed and well-nourished.  HENT:  Head: Normocephalic and atraumatic.  Neck: Normal range of motion.  Cardiovascular: Normal rate.   Respiratory: Effort normal.  GI: Soft. She exhibits no distension and no mass. There is no tenderness. There is no rebound and no guarding.  Musculoskeletal: Normal range of motion.  Neurological: She is  alert and oriented to person, place, and time.  Skin: Skin is warm and dry.  Psychiatric: She has a normal mood and affect.   Results for orders placed or performed during the hospital encounter of 12/20/15 (from the past 24 hour(s))  Urinal[facility], [facility] w reflex [facility])     Status: Abnormal   Collection Time: 12/20/15  8:30 AM  Result Value Ref Range   Color, Urine YELLOW YELLOW   APPearance HAZY (A) CLEAR   Specific Gravity, Urine 1.015 1.005 - 1.030   pH 7.0 5.0 - 8.0   Glucose, UA NEGATIVE NEGATIVE mg/dL   Hgb urine dipstick NEGATIVE NEGATIVE   Bilirubin Urine NEGATIVE NEGATIVE    Ketones, ur NEGATIVE NEGATIVE mg/dL   Protein, ur NEGATIVE NEGATIVE mg/dL   Nitrite POSITIVE (A) NEGATIVE   Leukocytes, UA NEGATIVE NEGATIVE  Urine microscopic-add on     Status: Abnormal   Collection Time: 12/20/15  8:30 AM  Result Value Ref Range   Squamous Epithelial / LPF 6-30 (A) NONE SEEN   WBC, UA 0-5 0 - 5 WBC/hpf   RBC / HPF NONE SEEN 0 - 5 RBC/hpf   Bacteria, UA MANY (A) NONE SEEN   Urine-Other AMORPHOUS URATES/PHOSPHATES   Pregnancy, urine POC     Status: Abnormal   Collection Time: 12/20/15  8:36 AM  Result Value Ref Range   Preg Test, Ur POSITIVE (A) NEGATIVE    MAU Course  Procedures Phenergan 25 mg supp x1 Po challenge  MDM Labs ordered and reviewed. No evidence of dehydration. Tolerated food and fluids. Will treat UTI and send US. Stable for discharge home.  Assessment and Plan   1. UTI in pregnancy, first trimester   2. Nausea and vomiting during pregnancy    Discharge home Macrobid 100 mg po bid x7 days #14, no refill (paper Rx) Diclegis (OTC regimen) Phenergan 25 mg po/pr q6 hrs prn #30, refill x1 (paper Rx) Follow up in [facility] for prenatal care Return for worsening sx  Donette LarryMelanie Lyndal Alamillo, CNM 12/20/2015, 9:04 AM

## 2015-12-22 LAB — CULTURE, OB URINE

## 2016-01-07 ENCOUNTER — Encounter: Payer: Medicaid Other | Admitting: Obstetrics & Gynecology

## 2016-01-15 ENCOUNTER — Inpatient Hospital Stay (HOSPITAL_COMMUNITY)
Admission: AD | Admit: 2016-01-15 | Discharge: 2016-01-15 | Disposition: A | Discharge status: Home / Self Care | Payer: Medicaid Other | Source: Ambulatory Visit | Attending: Obstetrics and Gynecology | Admitting: Obstetrics and Gynecology

## 2016-01-15 ENCOUNTER — Inpatient Hospital Stay (HOSPITAL_COMMUNITY): Payer: Medicaid Other

## 2016-01-15 ENCOUNTER — Encounter (HOSPITAL_COMMUNITY): Payer: Self-pay | Admitting: *Deleted

## 2016-01-15 DIAGNOSIS — O99611 Diseases of the digestive system complicating pregnancy, first trimester: Secondary | ICD-10-CM | POA: Diagnosis not present

## 2016-01-15 DIAGNOSIS — K117 Disturbances of salivary secretion: Secondary | ICD-10-CM | POA: Diagnosis not present

## 2016-01-15 DIAGNOSIS — Z3A08 8 weeks gestation of pregnancy: Secondary | ICD-10-CM | POA: Diagnosis not present

## 2016-01-15 DIAGNOSIS — R102 Pelvic and perineal pain: Secondary | ICD-10-CM

## 2016-01-15 DIAGNOSIS — Z3491 Encounter for supervision of normal pregnancy, unspecified, first trimester: Secondary | ICD-10-CM

## 2016-01-15 DIAGNOSIS — O219 Vomiting of pregnancy, unspecified: Secondary | ICD-10-CM

## 2016-01-15 DIAGNOSIS — O9989 Other specified diseases and conditions complicating pregnancy, childbirth and the puerperium: Secondary | ICD-10-CM

## 2016-01-15 DIAGNOSIS — O26899 Other specified pregnancy related conditions, unspecified trimester: Secondary | ICD-10-CM

## 2016-01-15 DIAGNOSIS — R109 Unspecified abdominal pain: Secondary | ICD-10-CM

## 2016-01-15 DIAGNOSIS — O210 Mild hyperemesis gravidarum: Secondary | ICD-10-CM | POA: Insufficient documentation

## 2016-01-15 LAB — URINALYSIS, ROUTINE W REFLEX MICROSCOPIC
Bilirubin Urine: NEGATIVE
GLUCOSE, UA: NEGATIVE mg/dL
HGB URINE DIPSTICK: NEGATIVE
KETONES UR: NEGATIVE mg/dL
LEUKOCYTES UA: NEGATIVE
Nitrite: NEGATIVE
PROTEIN: NEGATIVE mg/dL
Specific Gravity, Urine: 1.015 (ref 1.005–1.030)
pH: 6.5 (ref 5.0–8.0)

## 2016-01-15 LAB — COMPREHENSIVE METABOLIC PANEL
ALBUMIN: 4.1 g/dL (ref 3.5–5.0)
ALK PHOS: 36 U/L — AB (ref 38–126)
ALT: 12 U/L — AB (ref 14–54)
ANION GAP: 8 (ref 5–15)
AST: 13 U/L — AB (ref 15–41)
BUN: 6 mg/dL (ref 6–20)
CALCIUM: 9.5 mg/dL (ref 8.9–10.3)
CO2: 23 mmol/L (ref 22–32)
Chloride: 103 mmol/L (ref 101–111)
Creatinine, Ser: 0.48 mg/dL (ref 0.44–1.00)
GFR calc Af Amer: >60 mL/min (ref >60)
GFR calc non Af Amer: >60 mL/min (ref >60)
GLUCOSE: 87 mg/dL (ref 65–99)
Potassium: 3.9 mmol/L (ref 3.5–5.1)
Sodium: 134 mmol/L — ABNORMAL LOW (ref 135–145)
Total Bilirubin: 1.1 mg/dL (ref 0.3–1.2)
Total Protein: 7.2 g/dL (ref 6.5–8.1)

## 2016-01-15 LAB — CBC
HEMATOCRIT: 35.2 % — AB (ref 36.0–46.0)
Hemoglobin: 12.4 g/dL (ref 12.0–15.0)
MCH: 26.4 pg (ref 26.0–34.0)
MCHC: 35.2 g/dL (ref 30.0–36.0)
MCV: 74.9 fL — ABNORMAL LOW (ref 78.0–100.0)
PLATELETS: 142 10*3/uL — AB (ref 150–400)
RBC: 4.7 MIL/uL (ref 3.87–5.11)
RDW: 13 % (ref 11.5–15.5)
WBC: 8 10*3/uL (ref 4.0–10.5)

## 2016-01-15 LAB — WET PREP, GENITAL
Clue Cells Wet Prep HPF POC: NONE SEEN
Sperm: NONE SEEN
TRICH WET PREP: NONE SEEN
YEAST WET PREP: NONE SEEN

## 2016-01-15 LAB — HIV ANTIBODY (ROUTINE TESTING W REFLEX): HIV Screen 4th Generation wRfx: NONREACTIVE

## 2016-01-15 LAB — HCG, QUANTITATIVE, PREGNANCY: HCG, BETA CHAIN, QUANT, S: 72151 m[IU]/mL — AB (ref <5)

## 2016-01-15 MED ORDER — PROMETHAZINE HCL 25 MG PO TABS: 25.0000 mg | ORAL_TABLET | Freq: Four times a day (QID) | ORAL | 0 refills | Status: DC | PRN

## 2016-01-15 MED ORDER — GLYCOPYRROLATE 1 MG PO TABS
2.0000 mg | ORAL_TABLET | Freq: Once | ORAL | Status: AC
  Administered 2016-01-15: 2 mg via ORAL
  Filled 2016-01-15: qty 2

## 2016-01-15 MED ORDER — GLYCOPYRROLATE 1 MG PO TABS: 1.0000 mg | ORAL_TABLET | Freq: Three times a day (TID) | ORAL | 0 refills | Status: DC

## 2016-01-15 MED ORDER — PROMETHAZINE HCL 25 MG/ML IJ SOLN
12.5000 mg | Freq: Once | INTRAMUSCULAR | Status: AC
  Administered 2016-01-15: 12.5 mg via INTRAMUSCULAR
  Filled 2016-01-15: qty 1

## 2016-01-15 NOTE — MAU Note (Signed)
Urine in lab 

## 2016-01-15 NOTE — MAU Provider Note (Signed)
History   [id number]   Chief Complaint  Patient presents with  . Morning Sickness  . Abdominal Pain    HPI Renee Suarez is a 25 y.o. female  [phone] at 5122w0d by LMP who is here with nausea/vomiting & abdominal cramping.  Nausea & vomiting for the last month that she has been managing with promethazine suppositories. Ran out of her medication 3 days ago. Has vomited 8 times in the last 24 hours; mostly stomach acid. Endorses increase in salivation & spitting. Episode of watery stool x 1 today; states during this pregnancy she has had at least 1 episode of diarrhea per week.  Lower abdominal cramping since last night. Symptoms have somewhat improved this morning. Reports intermittent lower abdominal cramping that she now rates 4/10. Has not treated. No aggravating or alleviating factors. Denies fever, vaginal bleeding, vaginal discharge, or urinary complaints.   [facility].    Patient's last menstrual period was 11/20/2015.  OB History  Gravida Para Term Preterm AB Living  4 2 1 1 1 2   SAB TAB Ectopic Multiple Live Births  0 1 0 0 2    # Outcome Date GA Lbr Len/2nd Weight Sex Delivery Anes PTL Lv  4 Current           3 Term 04/25/11 3381w0d  7 lb 0.4 oz (3.185 kg) F CS-LTranv Spinal  LIV  2 Preterm 04/15/08 4457w0d  3 lb 13 oz (1.729 kg) F CS-Unspec Spinal  LIV  1 TAB               Past Medical History:  Diagnosis Date  . Anemia   . Eclampsia    with 1st preg  . GERD (gastroesophageal reflux disease)    post partum 1st preg.  [name]. Headache   . Hypertension   . Pregnancy induced hypertension   . Preterm labor     Family History  Problem Relation Age of Onset  . Hypertension Paternal Grandmother   . Diabetes Paternal Grandmother   . Heart disease Paternal Grandmother   . Diabetes Mother   . Cancer Paternal Grandfather     pancreatic  . Mental illness Brother   . Mental retardation Brother     Social History   Social History   . Marital status: Single    Spouse name: N/A  . Number of children: N/A  . Years of education: N/A   Social History Main Topics  . Smoking status: Never Smoker  . Smokeless tobacco: Never Used  . Alcohol use No  . Drug use:     Types: Marijuana  . Sexual activity: Yes    Birth control/ protection: Condom     Comment: 2 weeks ago (no new partners)   Other Topics Concern  . None   Social History Narrative  . None    Allergies  Allergen Reactions  . Percocet [Oxycodone-Acetaminophen] Hives    No current facility-administered medications on file prior to encounter.    Current Outpatient Prescriptions on File Prior to Encounter  Medication Sig Dispense Refill  . promethazine (PHENERGAN) 25 MG suppository Place 1 suppository (25 mg total) rectally every 6 (six) hours as needed for nausea or vomiting. 12 each 0  . promethazine (PHENERGAN) 25 MG tablet Take 0.5 tablets (12.5 mg total) by mouth every 6 (six) hours as needed for nausea or vomiting. 30 tablet 1     Review of Systems  Constitutional: Negative for chills and fever.  Gastrointestinal: Positive for abdominal pain, diarrhea, nausea and vomiting. Negative for abdominal distention and blood in stool.  Genitourinary: Negative for difficulty urinating, dysuria, frequency, vaginal bleeding and vaginal discharge.     Physical Exam   Vitals:   01/15/16 0822  BP: 117/70  Pulse: 80  Resp: 16    Physical Exam  Nursing note and vitals reviewed. Constitutional: She is oriented to person, place, and time. She appears well-developed and well-nourished. No distress.  HENT:  Head: Normocephalic and atraumatic.  Eyes: Conjunctivae are normal. Right eye exhibits no discharge. Left eye exhibits no discharge. No scleral icterus.  Neck: Normal range of motion.  Cardiovascular: Normal rate, regular rhythm and normal heart sounds.   No murmur heard. Respiratory: Effort normal and breath sounds normal. No respiratory distress.  She has no wheezes.  GI: Soft. Bowel sounds are normal. She exhibits no distension. There is no tenderness. There is no rebound.  Neurological: She is alert and oriented to person, place, and time.  Skin: Skin is warm and dry. She is not diaphoretic.  Psychiatric: She has a normal mood and affect. Her behavior is normal. Judgment and thought content normal.    MAU Course  Procedures Results for orders placed or performed during the hospital encounter of 01/15/16 (from the past 24 hour(s))  Urinalysis, Routine w reflex microscopic (not at [facility])     Status: Abnormal   Collection Time: 01/15/16  8:15 AM  Result Value Ref Range   Color, Urine YELLOW YELLOW   APPearance CLOUDY (A) CLEAR   Specific Gravity, Urine 1.015 1.005 - 1.030   pH 6.5 5.0 - 8.0   Glucose, UA NEGATIVE NEGATIVE mg/dL   Hgb urine dipstick NEGATIVE NEGATIVE   Bilirubin Urine NEGATIVE NEGATIVE   Ketones, ur NEGATIVE NEGATIVE mg/dL   Protein, ur NEGATIVE NEGATIVE mg/dL   Nitrite NEGATIVE NEGATIVE   Leukocytes, UA NEGATIVE NEGATIVE  CBC     Status: Abnormal   Collection Time: 01/15/16  9:05 AM  Result Value Ref Range   WBC 8.0 4.0 - 10.5 K/uL   RBC 4.70 3.87 - 5.11 MIL/uL   Hemoglobin 12.4 12.0 - 15.0 g/dL   HCT 16.1 (L) 09.6 - 04.5 %   MCV 74.9 (L) 78.0 - 100.0 fL   MCH 26.4 26.0 - 34.0 pg   MCHC 35.2 30.0 - 36.0 g/dL   RDW 40.9 81.1 - 91.4 %   Platelets 142 (L) 150 - 400 K/uL  hCG, quantitative, pregnancy     Status: Abnormal   Collection Time: 01/15/16  9:05 AM  Result Value Ref Range   hCG, Beta Chain, Quant, S 72,151 (H) <5 mIU/mL  Comprehensive metabolic panel     Status: Abnormal   Collection Time: 01/15/16  9:05 AM  Result Value Ref Range   Sodium 134 (L) 135 - 145 mmol/L   Potassium 3.9 3.5 - 5.1 mmol/L   Chloride 103 101 - 111 mmol/L   CO2 23 22 - 32 mmol/L   Glucose, Bld 87 65 - 99 mg/dL   BUN 6 6 - 20 mg/dL   Creatinine, Ser 7.82 0.44 - 1.00 mg/dL   Calcium 9.5 8.9 - 95.6 mg/dL   Total Protein  7.2 6.5 - 8.1 g/dL   Albumin 4.1 3.5 - 5.0 g/dL   AST 13 (L) 15 - 41 U/L   ALT 12 (L) 14 - 54 U/L   Alkaline Phosphatase 36 (L) 38 - 126 U/L   Total Bilirubin 1.1 0.3 - 1.2 mg/dL  GFR calc non Af Amer >60 >60 mL/min   GFR calc Af Amer >60 >60 mL/min   Anion gap 8 5 - 15  Wet prep, genital     Status: Abnormal   Collection Time: 01/15/16  9:10 AM  Result Value Ref Range   Yeast Wet Prep HPF POC NONE SEEN NONE SEEN   Trich, Wet Prep NONE SEEN NONE SEEN   Clue Cells Wet Prep HPF POC NONE SEEN NONE SEEN   WBC, Wet Prep HPF POC FEW (A) NONE SEEN   Sperm NONE SEEN    US Ob Comp Less 14 Wks  Result Date: 01/15/2016 CLINICAL DATA:  Pelvic pain and cramping, early pregnancy EXAM: OBSTETRIC <14 WK ULTRASOUND TECHNIQUE: Transabdominal ultrasound was performed for evaluation of the gestation as well as the maternal uterus and adnexal regions. COMPARISON:  None. FINDINGS: Intrauterine gestational sac: Present Yolk sac:  Present Embryo:  Present Cardiac Activity: Present Heart Rate: 180 bpm CRL:   30  mm   9 w 6 D d                  [place] EDC: 08/13/2016 Subchorionic hemorrhage:  None visualized. Maternal uterus/adnexae: Within normal limits IMPRESSION: Single live intrauterine gestation at 9 weeks 6 days by crown-rump length. No acute abnormality noted. Electronically Signed   By: Alcide Clever M.D.   On: 01/15/2016 09:58     MDM +UPT UA, wet prep, GC/chlamydia, CBC, ABO/Rh, quant hCG, HIV, and [place] today to rule out ectopic pregnancy U/a shows no evidence of dehydration Phenergan 25 mg IM Robinul 2 mg PO Ultrasound shows SIUP with cardiac activity measuring [redacted]w[redacted]d by CRL Assessment and Plan  A: 1. Normal IUP (intrauterine pregnancy) on prenatal ultrasound, first trimester   2. Abdominal pain affecting pregnancy   3. Ptyalism   4. Nausea and vomiting during pregnancy prior to [redacted] weeks gestation    P: Discharge home Discussed reasons to return to MAU Rx promethazine & glycopyrrolate   Follow-up  Information    [facility] .   Why:  For scheduled prenatal care Contact information: [address] [place] [address] [phone]       [facility] [place] [facility]ERN[facility] ADMISSIONS .   Why:  for pregnancy related emergencies Contact information: [address] [id number] mc [place] [address] [phone]              Judeth Horn, NP 01/15/2016 8:37 AM

## 2016-01-15 NOTE — Discharge Instructions (Signed)

## 2016-01-15 NOTE — MAU Note (Signed)
C/o N&V...has meds but they are not working; c/o abdominal cramping that started this Am around 0300;

## 2016-01-15 NOTE — MAU Note (Signed)
Also c/o cold and cough for past 5-6 days;

## 2016-01-16 LAB — GC/CHLAMYDIA PROBE AMP (~~LOC~~) NOT AT ARMC
Chlamydia: NEGATIVE
Neisseria Gonorrhea: NEGATIVE

## 2016-01-22 ENCOUNTER — Encounter: Payer: Self-pay | Admitting: Obstetrics & Gynecology

## 2016-01-22 ENCOUNTER — Ambulatory Visit (INDEPENDENT_AMBULATORY_CARE_PROVIDER_SITE_OTHER): Payer: Medicaid Other | Admitting: Obstetrics & Gynecology

## 2016-01-22 DIAGNOSIS — O210 Mild hyperemesis gravidarum: Secondary | ICD-10-CM | POA: Diagnosis not present

## 2016-01-22 DIAGNOSIS — O34219 Maternal care for unspecified type scar from previous cesarean delivery: Secondary | ICD-10-CM | POA: Diagnosis not present

## 2016-01-22 DIAGNOSIS — O0991 Supervision of high risk pregnancy, unspecified, first trimester: Secondary | ICD-10-CM

## 2016-01-22 DIAGNOSIS — O0990 Supervision of high risk pregnancy, unspecified, unspecified trimester: Secondary | ICD-10-CM | POA: Insufficient documentation

## 2016-01-22 MED ORDER — ONDANSETRON 4 MG PO TBDP: 4.0000 mg | ORAL_TABLET | Freq: Four times a day (QID) | ORAL | 0 refills | Status: DC | PRN

## 2016-01-22 NOTE — Progress Notes (Signed)
Patient states that nausea and vomiting lasts all day and is not better despite taking meds.

## 2016-01-22 NOTE — Progress Notes (Signed)
Subjective:N&V and spitting    Renee Suarez is a [id number] 6371w6d being seen today for her first obstetrical visit.  Her obstetrical history is significant for hyperemesis. Patient does intend to breast feed. Pregnancy history fully reviewed.  Patient reports nausea, vomiting and ptyalism.  Vitals:   01/22/16 1409  BP: 112/75  Pulse: 65  Temp: 98.8 F (37.1 C)  Weight: 164 lb 3.2 oz (74.5 kg)    HISTORY: OB History  Gravida Para Term Preterm AB Living  4 2 1 1 1 2   SAB TAB Ectopic Multiple Live Births  0 1 0 0 2    # Outcome Date GA Lbr Len/2nd Weight Sex Delivery Anes PTL Lv  4 Current           3 Term 04/25/11 6187w0d  7 lb 0.4 oz (3.185 kg) F CS-LTranv Spinal  LIV  2 Preterm 04/15/08 7439w0d  3 lb 13 oz (1.729 kg) F CS-Unspec Spinal  LIV  1 TAB              Past Medical History:  Diagnosis Date  . Anemia   . Eclampsia    with 1st preg  . GERD (gastroesophageal reflux disease)    post partum 1st preg.  Renee Suarez. Headache   . Hypertension   . Pregnancy induced hypertension   . Preterm labor    Past Surgical History:  Procedure Laterality Date  . CESAREAN SECTION    . CESAREAN SECTION  04/25/2011   Procedure: CESAREAN SECTION;  Surgeon: [name], MD;  Location: [facility];  Service: Gynecology;  Laterality: N/A;  Repeat C/Section  . CHOLECYSTECTOMY    . CHOLECYSTECTOMY  2010   Family History  Problem Relation Age of Onset  . Hypertension Paternal Grandmother   . Diabetes Paternal Grandmother   . Heart disease Paternal Grandmother   . Diabetes Mother   . Cancer Paternal Grandfather     pancreatic  . Mental illness Brother   . Mental retardation Brother      Exam    Uterus:     Pelvic Exam:    Perineum: No Hemorrhoids   Vulva: normal   Vagina:  normal mucosa   pH:     Cervix: no lesions   Adnexa: normal adnexa   Bony Pelvis: average  System: Breast:  normal appearance, no masses or tenderness   Skin: normal coloration and turgor, no rashes    Neurologic: oriented, normal mood   Extremities: normal strength, tone, and muscle mass   HEENT extra ocular movement intact and sclera clear, anicteric   Mouth/Teeth mucous membranes moist, pharynx normal without lesions and dental hygiene good   Neck supple and no masses   Cardiovascular: regular rate and rhythm, no murmurs or gallops   Respiratory:  appears well, vitals normal, no respiratory distress, acyanotic, normal RR, neck free of mass or lymphadenopathy, chest clear, no wheezing, crepitations, rhonchi, normal symmetric air entry   Abdomen: soft, non-tender; bowel sounds normal; no masses,  no organomegaly   Urinary: urethral meatus normal      Assessment:    Pregnancy: [id number] Patient Active Problem List   Diagnosis Date Noted  . Supervision of high risk pregnancy, antepartum 01/22/2016  . History of cesarean delivery, antepartum 01/22/2016  . Hyperemesis gravidarum, mild, before 23rd week 01/22/2016  . Hx of preeclampsia, prior pregnancy, currently pregnant 02/03/2011        Plan:     Initial labs drawn. Prenatal vitamins. Problem list reviewed and updated.  Genetic Screening discussed First Screen: requested.  Ultrasound discussed; fetal survey: 18 weeks.  Follow up in 4 weeks. 50% of 30 min visit spent on counseling and coordination of care.  Zofran for nausea   Renee Suarez 01/22/2016

## 2016-01-24 LAB — COMPREHENSIVE METABOLIC PANEL
ALBUMIN: 4.3 g/dL (ref 3.5–5.5)
ALT: 17 IU/L (ref 0–32)
AST: 11 IU/L (ref 0–40)
Albumin/Globulin Ratio: 1.5 (ref 1.2–2.2)
Alkaline Phosphatase: 36 IU/L — ABNORMAL LOW (ref 39–117)
BUN / CREAT RATIO: 12 (ref 9–23)
BUN: 6 mg/dL (ref 6–20)
Bilirubin Total: 0.7 mg/dL (ref 0.0–1.2)
CO2: 22 mmol/L (ref 18–29)
CREATININE: 0.49 mg/dL — AB (ref 0.57–1.00)
Calcium: 9.5 mg/dL (ref 8.7–10.2)
Chloride: 99 mmol/L (ref 96–106)
GFR, EST AFRICAN AMERICAN: 156 mL/min/{1.73_m2} (ref >59)
GFR, EST NON AFRICAN AMERICAN: 135 mL/min/{1.73_m2} (ref >59)
GLOBULIN, TOTAL: 2.9 g/dL (ref 1.5–4.5)
Glucose: 73 mg/dL (ref 65–99)
Potassium: 3.7 mmol/L (ref 3.5–5.2)
SODIUM: 136 mmol/L (ref 134–144)
TOTAL PROTEIN: 7.2 g/dL (ref 6.0–8.5)

## 2016-01-24 LAB — HEMOGLOBINOPATHY EVALUATION
HEMOGLOBIN F QUANTITATION: 0 % (ref 0.0–2.0)
HGB C: 0 %
HGB S: 33.9 % — AB
Hemoglobin A2 Quantitation: 4 % — ABNORMAL HIGH (ref 0.7–3.1)
Hgb A: 62.1 % — ABNORMAL LOW (ref 94.0–98.0)

## 2016-01-24 LAB — PRENATAL PROFILE I(LABCORP)
Antibody Screen: NEGATIVE
BASOS ABS: 0 10*3/uL (ref 0.0–0.2)
BASOS: 0 %
EOS (ABSOLUTE): 0.1 10*3/uL (ref 0.0–0.4)
EOS: 1 %
HEMATOCRIT: 37 % (ref 34.0–46.6)
Hemoglobin: 12.3 g/dL (ref 11.1–15.9)
Hepatitis B Surface Ag: NEGATIVE
Immature Grans (Abs): 0 10*3/uL (ref 0.0–0.1)
Immature Granulocytes: 0 %
LYMPHS ABS: 1.9 10*3/uL (ref 0.7–3.1)
Lymphs: 26 %
MCH: 26.6 pg (ref 26.6–33.0)
MCHC: 33.2 g/dL (ref 31.5–35.7)
MCV: 80 fL (ref 79–97)
Monocytes Absolute: 0.4 10*3/uL (ref 0.1–0.9)
Monocytes: 5 %
NEUTROS ABS: 5.1 10*3/uL (ref 1.4–7.0)
Neutrophils: 68 %
PLATELETS: 163 10*3/uL (ref 150–379)
RBC: 4.63 x10E6/uL (ref 3.77–5.28)
RDW: 13.4 % (ref 12.3–15.4)
RH TYPE: POSITIVE
RPR: NONREACTIVE
Rubella Antibodies, IGG: 1.02 index (ref >0.99)
WBC: 7.4 10*3/uL (ref 3.4–10.8)

## 2016-01-24 LAB — HIV ANTIBODY (ROUTINE TESTING W REFLEX): HIV Screen 4th Generation wRfx: NONREACTIVE

## 2016-01-24 LAB — GC/CHLAMYDIA PROBE AMP
CHLAMYDIA, DNA PROBE: NEGATIVE
Neisseria gonorrhoeae by PCR: NEGATIVE

## 2016-01-25 ENCOUNTER — Encounter (HOSPITAL_COMMUNITY): Payer: Self-pay | Admitting: Obstetrics & Gynecology

## 2016-01-25 LAB — PAP IG W/ RFLX HPV ASCU: PAP SMEAR COMMENT: 0

## 2016-01-25 LAB — URINE CULTURE, OB REFLEX

## 2016-01-25 LAB — CULTURE, OB URINE

## 2016-01-29 LAB — TOXASSURE SELECT 13 (MW), URINE

## 2016-01-30 ENCOUNTER — Encounter (HOSPITAL_COMMUNITY): Payer: Self-pay | Admitting: *Deleted

## 2016-01-30 ENCOUNTER — Encounter: Payer: Self-pay | Admitting: Obstetrics & Gynecology

## 2016-01-30 ENCOUNTER — Other Ambulatory Visit: Payer: Self-pay

## 2016-01-30 ENCOUNTER — Inpatient Hospital Stay (HOSPITAL_COMMUNITY)
Admission: AD | Admit: 2016-01-30 | Discharge: 2016-01-30 | Disposition: A | Discharge status: Home / Self Care | Payer: Medicaid Other | Source: Ambulatory Visit | Attending: Obstetrics and Gynecology | Admitting: Obstetrics and Gynecology

## 2016-01-30 DIAGNOSIS — Z87891 Personal history of nicotine dependence: Secondary | ICD-10-CM | POA: Diagnosis not present

## 2016-01-30 DIAGNOSIS — D573 Sickle-cell trait: Secondary | ICD-10-CM | POA: Insufficient documentation

## 2016-01-30 DIAGNOSIS — O26891 Other specified pregnancy related conditions, first trimester: Secondary | ICD-10-CM | POA: Diagnosis not present

## 2016-01-30 DIAGNOSIS — R51 Headache: Secondary | ICD-10-CM | POA: Diagnosis present

## 2016-01-30 DIAGNOSIS — O210 Mild hyperemesis gravidarum: Secondary | ICD-10-CM

## 2016-01-30 DIAGNOSIS — Z3A12 12 weeks gestation of pregnancy: Secondary | ICD-10-CM | POA: Diagnosis not present

## 2016-01-30 LAB — URINALYSIS, ROUTINE W REFLEX MICROSCOPIC
Bilirubin Urine: NEGATIVE
Glucose, UA: NEGATIVE mg/dL
Hgb urine dipstick: NEGATIVE
Ketones, ur: NEGATIVE mg/dL
LEUKOCYTES UA: NEGATIVE
NITRITE: NEGATIVE
PROTEIN: NEGATIVE mg/dL
Specific Gravity, Urine: 1.015 (ref 1.005–1.030)
pH: 6 (ref 5.0–8.0)

## 2016-01-30 MED ORDER — ONDANSETRON 8 MG PO TBDP
8.0000 mg | ORAL_TABLET | Freq: Once | ORAL | Status: AC
  Administered 2016-01-30: 8 mg via ORAL
  Filled 2016-01-30: qty 1

## 2016-01-30 MED ORDER — GLYCOPYRROLATE 1 MG PO TABS
2.0000 mg | ORAL_TABLET | Freq: Once | ORAL | Status: AC
  Administered 2016-01-30: 2 mg via ORAL
  Filled 2016-01-30: qty 2

## 2016-01-30 MED ORDER — IBUPROFEN 600 MG PO TABS
600.0000 mg | ORAL_TABLET | Freq: Once | ORAL | Status: AC
  Administered 2016-01-30: 600 mg via ORAL
  Filled 2016-01-30: qty 1

## 2016-01-30 MED ORDER — NITROFURANTOIN MONOHYD MACRO 100 MG PO CAPS: 100.0000 mg | ORAL_CAPSULE | Freq: Two times a day (BID) | ORAL | 0 refills | Status: DC

## 2016-01-30 NOTE — Discharge Instructions (Signed)

## 2016-01-30 NOTE — MAU Note (Signed)
Pt presents complaining of a headache since last night. Tried to sleep but woke up at 0400 with a worse headache. Attempted to take tylenol but vomited it back up. Took phenergan for the nausea and vomiting. Denies vaginal bleeding or discharge.

## 2016-01-30 NOTE — Telephone Encounter (Signed)
I have spoken with patient regarding urine culture. Medication have been called into [facility] pharmacy.

## 2016-01-30 NOTE — MAU Provider Note (Signed)
Chief Complaint: Headache   First Provider Initiated Contact with Patient 01/30/16 [phone]     SUBJECTIVE HPI: Renee Suarez is a 25 y.o. [id number] at 3813w0d who presents to [facility]missions reporting waking up with a severe headache at 4 AM and continued nausea and vomiting of pregnancy, vomiting approximately 5 times per day. 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 Was prescribed Zofran at her new OB visit last week, but does not seem to be aware of the prescription and has not taken any. Takes Robinul for ptyalism. States that it helps. Last dose of Phenergan approximately 24 hours ago.  Location: Top of head Quality: Throbbing Severity: 8/10 on pain scale at worst. Mild improvement. Duration: 3 hours Context: With frequent vomiting Timing: Constant Modifying factors: No improvement with drinking water and lying down. Try to take extra strength Tylenol, but vomited intact tablets immediately afterwards. Associated signs and symptoms: Positive for nausea and vomiting. Negative for fever, chills, weakness, difficulties with speech or gait, sinus congestion, vision changes.  Past Medical History:  Diagnosis Date  . Anemia   . Eclampsia    with 1st preg  . GERD (gastroesophageal reflux disease)    post partum 1st preg.  [name]. Headache   . Hypertension   . Pregnancy induced hypertension   . Preterm labor    OB History  Gravida Para Term Preterm AB Living  4 2 1 1 1 2   SAB TAB Ectopic Multiple Live Births  0 1 0 0 2    # Outcome Date GA Lbr Len/2nd Weight Sex Delivery Anes PTL Lv  4 Current           3 Term 04/25/11 1722w0d  7 lb 0.4 oz (3.185 kg) F CS-LTranv Spinal  LIV  2 Preterm 04/15/08 4943w0d  3 lb 13 oz (1.729 kg) F CS-Unspec Spinal  LIV  1 TAB              Past Surgical History:  Procedure Laterality Date  . CESAREAN SECTION    . CESAREAN SECTION  04/25/2011   Procedure: CESAREAN SECTION;  Surgeon: [name], MD;  Location: [facility];  Service:  Gynecology;  Laterality: N/A;  Repeat C/Section  . CHOLECYSTECTOMY    . CHOLECYSTECTOMY  2010   Social History   Social History  . Marital status: Single    Spouse name: N/A  . Number of children: N/A  . Years of education: N/A   Occupational History  . Not on file.   Social History Main Topics  . Smoking status: Former Games developermoker  . Smokeless tobacco: Never Used  . Alcohol use No  . Drug use: No  . Sexual activity: Yes    Birth control/ protection: Condom     Comment: 2 weeks ago (no new partners)   Other Topics Concern  . Not on file   Social History Narrative  . No narrative on file   No current facility-administered medications on file prior to encounter.    Current Outpatient Prescriptions on File Prior to Encounter  Medication Sig Dispense Refill  . acetaminophen (TYLENOL) 500 MG tablet Take 500 mg by mouth every 6 (six) hours as needed for fever.    [name]. glycopyrrolate (ROBINUL) 1 MG tablet Take 1 tablet (1 mg total) by mouth 3 (three) times daily. 90 tablet 0  . ondansetron (ZOFRAN ODT) 4 MG disintegrating tablet Take 1 tablet (4 mg total) by mouth every 6 (six) hours as needed for nausea. 20 tablet  0  . promethazine (PHENERGAN) 25 MG tablet Take 1 tablet (25 mg total) by mouth every 6 (six) hours as needed for nausea or vomiting. 30 tablet 0   Allergies  Allergen Reactions  . Percocet [Oxycodone-Acetaminophen] Hives    Able to take Tylenol.    I have reviewed the past Medical Hx, Surgical Hx, Social Hx, Allergies and Medications.   Review of Systems  Constitutional: Negative for appetite change, chills and fever.  HENT: Negative for sinus pressure.   Eyes: Negative for photophobia and visual disturbance.  Gastrointestinal: Positive for nausea and vomiting. Negative for abdominal pain.  Genitourinary: Negative for vaginal bleeding.  Musculoskeletal: Negative for neck pain.  Neurological: Positive for headaches. Negative for syncope, speech difficulty, weakness  and numbness.    OBJECTIVE Patient Vitals for the past 24 hrs:  BP Temp Temp src Pulse Resp  01/30/16 0658 112/63 98 F (36.7 C) Oral 69 18   Constitutional: Well-developed, well-nourished female in no acute distress.  Head: Normocephalic. No nasal congestion or sinus tenderness. Mucous membranes moist.  Cardiovascular: normal rate Respiratory: normal rate and effort.  Neurologic: Alert and oriented x 4. Symmetric facial movements. GU: Deferred  Fetal heart rate 157 by Doppler.  LAB RESULTS Results for orders placed or performed during the hospital encounter of 01/30/16 (from the past 24 hour(s))  Urinalysis, Routine w reflex microscopic (not at [facility])     Status: Abnormal   Collection Time: 01/30/16  6:53 AM  Result Value Ref Range   Color, Urine YELLOW YELLOW   APPearance CLOUDY (A) CLEAR   Specific Gravity, Urine 1.015 1.005 - 1.030   pH 6.0 5.0 - 8.0   Glucose, UA NEGATIVE NEGATIVE mg/dL   Hgb urine dipstick NEGATIVE NEGATIVE   Bilirubin Urine NEGATIVE NEGATIVE   Ketones, ur NEGATIVE NEGATIVE mg/dL   Protein, ur NEGATIVE NEGATIVE mg/dL   Nitrite NEGATIVE NEGATIVE   Leukocytes, UA NEGATIVE NEGATIVE    IMAGING N/A  MAU COURSE Orders Placed This Encounter  Procedures  . Urinalysis, Routine w reflex microscopic (not at [facility])   Meds ordered this encounter  Medications  . ondansetron (ZOFRAN-ODT) disintegrating tablet 8 mg  . ibuprofen (ADVIL,MOTRIN) tablet 600 mg  . glycopyrrolate (ROBINUL) tablet 2 mg   Care of patient turned over to [facility] [place], CNM at 8 AM.  Dorathy Kinsman, CNM 01/30/2016  8:00 AM  Felt much better after meds Able to tolerate POs Headache is better  Discharge home Plans care at [facility], CNM

## 2016-02-05 ENCOUNTER — Encounter (HOSPITAL_COMMUNITY): Payer: Self-pay

## 2016-02-05 ENCOUNTER — Other Ambulatory Visit: Payer: Self-pay | Admitting: Obstetrics & Gynecology

## 2016-02-05 ENCOUNTER — Ambulatory Visit (HOSPITAL_COMMUNITY)
Admission: RE | Admit: 2016-02-05 | Discharge: 2016-02-05 | Disposition: A | Discharge status: Home / Self Care | Payer: Medicaid Other | Source: Ambulatory Visit | Attending: Obstetrics & Gynecology | Admitting: Obstetrics & Gynecology

## 2016-02-05 ENCOUNTER — Ambulatory Visit (HOSPITAL_COMMUNITY): Payer: Medicaid Other

## 2016-02-05 ENCOUNTER — Other Ambulatory Visit (HOSPITAL_COMMUNITY): Payer: Medicaid Other

## 2016-02-05 DIAGNOSIS — Z3682 Encounter for antenatal screening for nuchal translucency: Secondary | ICD-10-CM

## 2016-02-05 DIAGNOSIS — O09211 Supervision of pregnancy with history of pre-term labor, first trimester: Secondary | ICD-10-CM | POA: Diagnosis not present

## 2016-02-05 DIAGNOSIS — O09291 Supervision of pregnancy with other poor reproductive or obstetric history, first trimester: Secondary | ICD-10-CM

## 2016-02-05 DIAGNOSIS — O34219 Maternal care for unspecified type scar from previous cesarean delivery: Secondary | ICD-10-CM

## 2016-02-05 DIAGNOSIS — Z3A12 12 weeks gestation of pregnancy: Secondary | ICD-10-CM

## 2016-02-05 DIAGNOSIS — Z8751 Personal history of pre-term labor: Secondary | ICD-10-CM

## 2016-02-05 DIAGNOSIS — Z363 Encounter for antenatal screening for malformations: Secondary | ICD-10-CM

## 2016-02-05 DIAGNOSIS — O0991 Supervision of high risk pregnancy, unspecified, first trimester: Secondary | ICD-10-CM

## 2016-02-05 DIAGNOSIS — O0990 Supervision of high risk pregnancy, unspecified, unspecified trimester: Secondary | ICD-10-CM

## 2016-02-08 ENCOUNTER — Other Ambulatory Visit (HOSPITAL_COMMUNITY): Payer: Medicaid Other

## 2016-02-08 ENCOUNTER — Encounter (HOSPITAL_COMMUNITY): Payer: Medicaid Other

## 2016-02-08 ENCOUNTER — Ambulatory Visit (HOSPITAL_COMMUNITY): Admission: RE | Admit: 2016-02-08 | Payer: Medicaid Other | Source: Ambulatory Visit

## 2016-02-14 ENCOUNTER — Other Ambulatory Visit (HOSPITAL_COMMUNITY): Payer: Self-pay

## 2016-02-19 ENCOUNTER — Ambulatory Visit (INDEPENDENT_AMBULATORY_CARE_PROVIDER_SITE_OTHER): Payer: Medicaid Other | Admitting: Obstetrics & Gynecology

## 2016-02-19 VITALS — BP 112/74 | HR 61 | Temp 98.0°F | Wt 165.0 lb

## 2016-02-19 DIAGNOSIS — Z23 Encounter for immunization: Secondary | ICD-10-CM

## 2016-02-19 DIAGNOSIS — O210 Mild hyperemesis gravidarum: Secondary | ICD-10-CM

## 2016-02-19 DIAGNOSIS — O0990 Supervision of high risk pregnancy, unspecified, unspecified trimester: Secondary | ICD-10-CM

## 2016-02-19 DIAGNOSIS — Z3482 Encounter for supervision of other normal pregnancy, second trimester: Secondary | ICD-10-CM

## 2016-02-19 LAB — POCT URINALYSIS DIPSTICK
BILIRUBIN UA: NEGATIVE
Glucose, UA: NEGATIVE
KETONES UA: NEGATIVE
Nitrite, UA: POSITIVE
PH: 8
PROTEIN UA: NEGATIVE
SPEC GRAV UA: 1.01
Urobilinogen, UA: 0.2

## 2016-02-19 NOTE — Progress Notes (Signed)
   PRENATAL VISIT NOTE  Subjective:  Renee Suarez is a 25 y.o. [id number] at 5918w6d being seen today for ongoing prenatal care.  She is currently monitored for the following issues for this low-risk pregnancy and has Hx of preeclampsia, prior pregnancy, currently pregnant; Supervision of high risk pregnancy, antepartum; History of cesarean delivery, antepartum; Hyperemesis gravidarum, mild, before 23rd week; and Sickle cell trait (HCC) on her problem list.  Patient reports nausea, vomiting and constipation, spitting is getting better.  Contractions: Not present. Vag. Bleeding: None.   . Denies leaking of fluid.   The following portions of the patient's history were reviewed and updated as appropriate: allergies, current medications, past family history, past medical history, past social history, past surgical history and problem list. Problem list updated.  Objective:   Vitals:   02/19/16 0835  BP: 112/74  Pulse: 61  Temp: 98 F (36.7 C)  Weight: 165 lb (74.8 kg)    Fetal Status: Fetal Heart Rate (bpm): 147         General:  Alert, oriented and cooperative. Patient is in no acute distress.  Skin: Skin is warm and dry. No rash noted.   Cardiovascular: Normal heart rate noted  Respiratory: Normal respiratory effort, no problems with respiration noted  Abdomen: Soft, gravid, appropriate for gestational age. Pain/Pressure: Absent     Pelvic:  Cervical exam deferred        Extremities: Normal range of motion.  Edema: None  Mental Status: Normal mood and affect. Normal behavior. Normal judgment and thought content.   Assessment and Plan:  Pregnancy: [id number] at 5318w6d  1. Hyperemesis gravidarum, mild, before 23rd week Mld improvement, weight is stable  2. Supervision of high risk pregnancy, antepartum UTI Hx, need culture result from today - Culture, OB Urine - Flu Vaccine QUAD 36+ mos IM - POCT Urinalysis Dipstick - US OB Comp + 14 Wk; Future  Preterm labor symptoms and general  obstetric precautions including but not limited to vaginal bleeding, contractions, leaking of fluid and fetal movement were reviewed in detail with the patient. Please refer to After Visit Summary for other counseling recommendations.  Return in about 4 weeks (around 03/18/2016).  Adam PhenixJames G Arnold, MD

## 2016-02-19 NOTE — Progress Notes (Signed)
Pt still complains of UTI Sx. Positive Nitrates, blood, leuks on urine dip. Will send for culture

## 2016-02-19 NOTE — Patient Instructions (Signed)

## 2016-02-19 NOTE — Progress Notes (Signed)
Patient states that nausea medications are making her constipated, other than that she has been feeling ok.

## 2016-02-21 DIAGNOSIS — I10 Essential (primary) hypertension: Secondary | ICD-10-CM | POA: Insufficient documentation

## 2016-02-23 LAB — CULTURE, OB URINE

## 2016-02-23 LAB — URINE CULTURE, OB REFLEX

## 2016-03-11 ENCOUNTER — Other Ambulatory Visit: Payer: Self-pay | Admitting: *Deleted

## 2016-03-11 DIAGNOSIS — O2340 Unspecified infection of urinary tract in pregnancy, unspecified trimester: Secondary | ICD-10-CM

## 2016-03-11 MED ORDER — NITROFURANTOIN MONOHYD MACRO 100 MG PO CAPS: 100.0000 mg | ORAL_CAPSULE | Freq: Two times a day (BID) | ORAL | 0 refills | Status: DC

## 2016-03-11 NOTE — Progress Notes (Signed)
Macrobid 100mg  sent to pharmacy as ordered by Dr Debroah LoopArnold for UTI

## 2016-03-18 ENCOUNTER — Ambulatory Visit: Payer: Medicaid Other

## 2016-03-18 ENCOUNTER — Encounter: Payer: Medicaid Other | Admitting: Obstetrics & Gynecology

## 2016-03-18 NOTE — Addendum Note (Signed)
Addended by: Pennie BanterSMITH, MARNI W on: 03/18/2016 02:06 PM   Modules accepted: Orders

## 2016-03-19 ENCOUNTER — Other Ambulatory Visit: Payer: Self-pay | Admitting: Obstetrics & Gynecology

## 2016-03-19 ENCOUNTER — Ambulatory Visit (HOSPITAL_COMMUNITY)
Admission: RE | Admit: 2016-03-19 | Discharge: 2016-03-19 | Disposition: A | Discharge status: Home / Self Care | Payer: Medicaid Other | Source: Ambulatory Visit | Attending: Obstetrics & Gynecology | Admitting: Obstetrics & Gynecology

## 2016-03-19 DIAGNOSIS — D573 Sickle-cell trait: Secondary | ICD-10-CM | POA: Insufficient documentation

## 2016-03-19 DIAGNOSIS — Z363 Encounter for antenatal screening for malformations: Secondary | ICD-10-CM | POA: Insufficient documentation

## 2016-03-19 DIAGNOSIS — O358XX0 Maternal care for other (suspected) fetal abnormality and damage, not applicable or unspecified: Secondary | ICD-10-CM | POA: Diagnosis not present

## 2016-03-19 DIAGNOSIS — O34219 Maternal care for unspecified type scar from previous cesarean delivery: Secondary | ICD-10-CM | POA: Insufficient documentation

## 2016-03-19 DIAGNOSIS — O99012 Anemia complicating pregnancy, second trimester: Secondary | ICD-10-CM | POA: Insufficient documentation

## 2016-03-19 DIAGNOSIS — Z3482 Encounter for supervision of other normal pregnancy, second trimester: Secondary | ICD-10-CM

## 2016-03-19 DIAGNOSIS — Z3A19 19 weeks gestation of pregnancy: Secondary | ICD-10-CM | POA: Insufficient documentation

## 2016-03-24 ENCOUNTER — Other Ambulatory Visit (HOSPITAL_COMMUNITY): Payer: Self-pay | Admitting: *Deleted

## 2016-03-24 DIAGNOSIS — O358XX0 Maternal care for other (suspected) fetal abnormality and damage, not applicable or unspecified: Secondary | ICD-10-CM

## 2016-03-24 DIAGNOSIS — O35EXX0 Maternal care for other (suspected) fetal abnormality and damage, fetal genitourinary anomalies, not applicable or unspecified: Secondary | ICD-10-CM

## 2016-03-28 ENCOUNTER — Other Ambulatory Visit: Payer: Medicaid Other

## 2016-04-04 ENCOUNTER — Encounter (HOSPITAL_COMMUNITY): Payer: Self-pay | Admitting: *Deleted

## 2016-04-04 ENCOUNTER — Inpatient Hospital Stay (HOSPITAL_COMMUNITY)
Admission: AD | Admit: 2016-04-04 | Discharge: 2016-04-04 | Disposition: A | Discharge status: Home / Self Care | Payer: Medicaid Other | Source: Ambulatory Visit | Attending: Family Medicine | Admitting: Family Medicine

## 2016-04-04 DIAGNOSIS — O219 Vomiting of pregnancy, unspecified: Secondary | ICD-10-CM | POA: Diagnosis not present

## 2016-04-04 DIAGNOSIS — O99612 Diseases of the digestive system complicating pregnancy, second trimester: Secondary | ICD-10-CM | POA: Insufficient documentation

## 2016-04-04 DIAGNOSIS — Z885 Allergy status to narcotic agent status: Secondary | ICD-10-CM | POA: Diagnosis not present

## 2016-04-04 DIAGNOSIS — R42 Dizziness and giddiness: Secondary | ICD-10-CM | POA: Diagnosis present

## 2016-04-04 DIAGNOSIS — O26892 Other specified pregnancy related conditions, second trimester: Secondary | ICD-10-CM | POA: Diagnosis not present

## 2016-04-04 DIAGNOSIS — K117 Disturbances of salivary secretion: Secondary | ICD-10-CM | POA: Diagnosis not present

## 2016-04-04 DIAGNOSIS — Z87891 Personal history of nicotine dependence: Secondary | ICD-10-CM | POA: Insufficient documentation

## 2016-04-04 DIAGNOSIS — Z3A21 21 weeks gestation of pregnancy: Secondary | ICD-10-CM | POA: Diagnosis not present

## 2016-04-04 LAB — URINALYSIS, ROUTINE W REFLEX MICROSCOPIC
Bilirubin Urine: NEGATIVE
GLUCOSE, UA: NEGATIVE mg/dL
Hgb urine dipstick: NEGATIVE
KETONES UR: NEGATIVE mg/dL
LEUKOCYTES UA: NEGATIVE
NITRITE: NEGATIVE
PROTEIN: NEGATIVE mg/dL
Specific Gravity, Urine: 1.011 (ref 1.005–1.030)
pH: 7 (ref 5.0–8.0)

## 2016-04-04 MED ORDER — LACTATED RINGERS IV BOLUS (SEPSIS)
1000.0000 mL | Freq: Once | INTRAVENOUS | Status: AC
  Administered 2016-04-04: 1000 mL via INTRAVENOUS

## 2016-04-04 MED ORDER — GLYCOPYRROLATE 1 MG PO TABS: 1.0000 mg | ORAL_TABLET | Freq: Three times a day (TID) | ORAL | 2 refills | Status: DC

## 2016-04-04 NOTE — MAU Note (Signed)
Patient presents with c/o dizziness, vomits every day, cramping in lower abdomen.

## 2016-04-04 NOTE — MAU Provider Note (Signed)
Chief Complaint: Dizziness and Emesis During Pregnancy   First Provider Initiated Contact with Patient 04/04/16 1145      SUBJECTIVE HPI: Renee Suarez is a 25 y.o. [id number] at 10523w2d by LMP who presents to maternity admissions reporting nausea/vomiting and dizziness. She reports nausea and vomiting since early pregnancy with emesis now 3-4 x daily. She also has increased saliva with spitting frequently.  Dizziness is intermittent, is worse when she is unable to keep down fluids in the morning.  She is taking Phenergan at least once per day and occasional Zofran which does help.  She forgot about her Robinul prescription but reports the medicine helped with spitting early in pregnancy.  Eating and aromas make the symptoms worse. They have been consistent and unchanged since onset in early pregnancy. She denies abdominal cramping, vaginal bleeding, vaginal itching/burning, urinary symptoms, h/a, or fever/chills.     HPI  Past Medical History:  Diagnosis Date  . Anemia   . Eclampsia    with 1st preg  . GERD (gastroesophageal reflux disease)    post partum 1st preg.  [name]. Headache   . Hypertension   . Pregnancy induced hypertension   . Preterm labor    Past Surgical History:  Procedure Laterality Date  . CESAREAN SECTION    . CESAREAN SECTION  04/25/2011   Procedure: CESAREAN SECTION;  Surgeon: [name], MD;  Location: [facility];  Service: Gynecology;  Laterality: N/A;  Repeat C/Section  . CHOLECYSTECTOMY    . CHOLECYSTECTOMY  2010   Social History   Social History  . Marital status: Single    Spouse name: N/A  . Number of children: N/A  . Years of education: N/A   Occupational History  . Not on file.   Social History Main Topics  . Smoking status: Former Games developermoker  . Smokeless tobacco: Never Used  . Alcohol use No  . Drug use: No  . Sexual activity: Yes    Birth control/ protection: Condom     Comment: 2 weeks ago (no new partners)   Other Topics Concern  . Not on  file   Social History Narrative  . No narrative on file   No current facility-administered medications on file prior to encounter.    Current Outpatient Prescriptions on File Prior to Encounter  Medication Sig Dispense Refill  . ondansetron (ZOFRAN ODT) 4 MG disintegrating tablet Take 1 tablet (4 mg total) by mouth every 6 (six) hours as needed for nausea. 20 tablet 0  . promethazine (PHENERGAN) 25 MG tablet Take 1 tablet (25 mg total) by mouth every 6 (six) hours as needed for nausea or vomiting. 30 tablet 0   Allergies  Allergen Reactions  . Percocet [Oxycodone-Acetaminophen] Hives    ROS:  Review of Systems  Constitutional: Negative for chills, fatigue and fever.  Respiratory: Negative for shortness of breath.   Cardiovascular: Negative for chest pain.  Gastrointestinal: Positive for nausea and vomiting.  Genitourinary: Negative for difficulty urinating, dysuria, flank pain, pelvic pain, vaginal bleeding, vaginal discharge and vaginal pain.  Neurological: Positive for dizziness. Negative for headaches.  Psychiatric/Behavioral: Negative.      I have reviewed patient's Past Medical Hx, Surgical Hx, Family Hx, Social Hx, medications and allergies.   Physical Exam  Patient Vitals for the past 24 hrs:  BP Temp Temp src Pulse Resp SpO2  04/04/16 1149 113/56 - - 73 16 -  04/04/16 0957 106/76 - - 91 - 100 %  04/04/16 0955 92/55 - - [name](!)  121 - -  04/04/16 0954 103/59 - - 90 - -  04/04/16 0952 103/61 - - 84 - -  04/04/16 0936 124/59 98 F (36.7 C) Oral 80 18 100 %   Constitutional: Well-developed, well-nourished female in no acute distress.  Cardiovascular: normal rate Respiratory: normal effort GI: Abd soft, non-tender. Pos BS x 4 MS: Extremities nontender, no edema, normal ROM Neurologic: Alert and oriented x 4.  GU: Neg CVAT.  PELVIC EXAM: Cervix pink, visually closed, without lesion, scant white creamy discharge, vaginal walls and external genitalia normal Bimanual  exam: Cervix 0/long/high, firm, anterior, neg CMT, uterus nontender, nonenlarged, adnexa without tenderness, enlargement, or mass  FHT  140 by doppler  LAB RESULTS Results for orders placed or performed during the hospital encounter of 04/04/16 (from the past 24 hour(s))  Urinalysis, Routine w reflex microscopic     Status: Abnormal   Collection Time: 04/04/16 10:04 AM  Result Value Ref Range   Color, Urine YELLOW YELLOW   APPearance HAZY (A) CLEAR   Specific Gravity, Urine 1.011 1.005 - 1.030   pH 7.0 5.0 - 8.0   Glucose, UA NEGATIVE NEGATIVE mg/dL   Hgb urine dipstick NEGATIVE NEGATIVE   Bilirubin Urine NEGATIVE NEGATIVE   Ketones, ur NEGATIVE NEGATIVE mg/dL   Protein, ur NEGATIVE NEGATIVE mg/dL   Nitrite NEGATIVE NEGATIVE   Leukocytes, UA NEGATIVE NEGATIVE    A/Positive/-- (09/26 1642)  IMAGING [facility] Ob Detail +14 Wk  Result Date: 03/21/2016 OBSTETRICAL ULTRASOUND: This exam was performed within a [facility]. The OB US report was generated in the [facility] system, and faxed to the ordering physician.  This report is available in the [facility] PACS. See the [facility] Obstetric US report via the Image Link.   MAU Management/MDM: Ordered labs and reviewed results. No obvious signs of dehydration but pt reports significant improvement in MAU after IV fluids given.  Added Unisom/B6 to pt nausea management at home and pt to talk with [facility] about Diclegis prescription next week.  Also renewed Robinul prescription for spitting. Pt stable at time of discharge.  ASSESSMENT 1. Nausea and vomiting during pregnancy prior to [redacted] weeks gestation   2. Ptyalism     PLAN Discharge home   Medication List    STOP taking these medications   ibuprofen 200 MG tablet Commonly known as:  ADVIL,MOTRIN   nitrofurantoin (macrocrystal-monohydrate) 100 MG capsule Commonly known as:  MACROBID     TAKE these medications   glycopyrrolate 1 MG tablet Commonly known as:  ROBINUL Take 1  tablet (1 mg total) by mouth 3 (three) times daily.   ondansetron 4 MG disintegrating tablet Commonly known as:  ZOFRAN ODT Take 1 tablet (4 mg total) by mouth every 6 (six) hours as needed for nausea.   promethazine 25 MG tablet Commonly known as:  PHENERGAN Take 1 tablet (25 mg total) by mouth every 6 (six) hours as needed for nausea or vomiting.      Follow-up Information    [facility] Follow up.   Why:  Follow up as scheduled, return to MAU as needed for emergencies. Contact information: [address] [place] [id number] [phone]          Sharen CounterLisa Leftwich-Kirby Certified Nurse-Midwife 04/04/2016  11:57 AM

## 2016-04-04 NOTE — Discharge Instructions (Signed)
Nausea medication to take during pregnancy:   Unisom (doxylamine succinate 25 mg tablets) Take one tablet daily at bedtime. If symptoms are not adequately controlled, the dose can be increased to a maximum recommended dose of two tablets daily (1/2 tablet in the morning, 1/2 tablet mid-afternoon and one at bedtime).  Vitamin B6 100mg  tablets. Take one tablet twice a day (up to 200 mg per day).  Add Phenergan as prescribed to take as needed.    Morning Sickness Morning sickness is when you feel sick to your stomach (nauseous) during pregnancy. This nauseous feeling may or may not come with vomiting. It often occurs in the morning but can be a problem any time of day. Morning sickness is most common during the first trimester, but it may continue throughout pregnancy. While morning sickness is unpleasant, it is usually harmless unless you develop severe and continual vomiting (hyperemesis gravidarum). This condition requires more intense treatment. What are the causes? The cause of morning sickness is not completely known but seems to be related to normal hormonal changes that occur in pregnancy. What increases the risk? You are at greater risk if you:  Experienced nausea or vomiting before your pregnancy.  Had morning sickness during a previous pregnancy.  Are pregnant with more than one baby, such as twins. How is this treated? Do not use any medicines (prescription, over-the-counter, or herbal) for morning sickness without first talking to your health care provider. Your health care provider may prescribe or recommend:  Vitamin B6 supplements.  Anti-nausea medicines.  The herbal medicine ginger. Follow these instructions at home:  Only take over-the-counter or prescription medicines as directed by your health care provider.  Taking multivitamins before getting pregnant can prevent or decrease the severity of morning sickness in most women.  Eat a piece of dry toast or unsalted  crackers before getting out of bed in the morning.  Eat five or six small meals a day.  Eat dry and bland foods (rice, baked potato). Foods high in carbohydrates are often helpful.  Do not drink liquids with your meals. Drink liquids between meals.  Avoid greasy, fatty, and spicy foods.  Get someone to cook for you if the smell of any food causes nausea and vomiting.  If you feel nauseous after taking prenatal vitamins, take the vitamins at night or with a snack.  Snack on protein foods (nuts, yogurt, cheese) between meals if you are hungry.  Eat unsweetened gelatins for desserts.  Wearing an acupressure wristband (worn for sea sickness) may be helpful.  Acupuncture may be helpful.  Do not smoke.  Get a humidifier to keep the air in your house free of odors.  Get plenty of fresh air. Contact a health care provider if:  Your home remedies are not working, and you need medicine.  You feel dizzy or lightheaded.  You are losing weight. Get help right away if:  You have persistent and uncontrolled nausea and vomiting.  You pass out (faint). This information is not intended to replace advice given to you by your health care provider. Make sure you discuss any questions you have with your health care provider. Document Released: 06/05/2006 Document Revised: 09/20/2015 Document Reviewed: 09/29/2012 Elsevier Interactive Patient Education  2017 [employer]

## 2016-04-04 NOTE — MAU Note (Signed)
Patient states she still vomits twice a day, has HA's. Currently spitting. States N/V starts with her mouth and all the saliva.  Became dizzy when standing for orthostatics.

## 2016-04-11 ENCOUNTER — Encounter: Payer: Medicaid Other | Admitting: Certified Nurse Midwife

## 2016-04-17 ENCOUNTER — Ambulatory Visit (INDEPENDENT_AMBULATORY_CARE_PROVIDER_SITE_OTHER): Payer: Medicaid Other | Admitting: Obstetrics and Gynecology

## 2016-04-17 VITALS — BP 107/72 | HR 83 | Wt 172.2 lb

## 2016-04-17 DIAGNOSIS — O210 Mild hyperemesis gravidarum: Secondary | ICD-10-CM

## 2016-04-17 DIAGNOSIS — N135 Crossing vessel and stricture of ureter without hydronephrosis: Secondary | ICD-10-CM

## 2016-04-17 DIAGNOSIS — O0990 Supervision of high risk pregnancy, unspecified, unspecified trimester: Secondary | ICD-10-CM

## 2016-04-17 DIAGNOSIS — O34219 Maternal care for unspecified type scar from previous cesarean delivery: Secondary | ICD-10-CM

## 2016-04-17 DIAGNOSIS — O09299 Supervision of pregnancy with other poor reproductive or obstetric history, unspecified trimester: Secondary | ICD-10-CM

## 2016-04-17 MED ORDER — PREPLUS 27-1 MG PO TABS: 1.0000 | ORAL_TABLET | Freq: Every day | ORAL | 13 refills | Status: DC

## 2016-04-17 MED ORDER — ASPIRIN EC 81 MG PO TBEC: 81.0000 mg | DELAYED_RELEASE_TABLET | Freq: Every day | ORAL | 11 refills | Status: DC

## 2016-04-17 NOTE — Progress Notes (Signed)
Patient reports she is doing better- she wants to try to start her prenatal vitamins now. She laso has questions about her US.

## 2016-04-17 NOTE — Progress Notes (Signed)
Subjective:  Renee Suarez is a 25 y.o. [id number] at 6977w1d being seen today for ongoing prenatal care.  She is currently monitored for the following issues for this high-risk pregnancy and has Hx of preeclampsia, prior pregnancy, currently pregnant; Supervision of high risk pregnancy, antepartum; History of cesarean delivery, antepartum; Hyperemesis gravidarum, mild, before 23rd week; Sickle cell trait (HCC); Hypertension; and UPJ (ureteropelvic junction) obstruction on her problem list.  Patient reports no complaints.  Contractions: Not present. Vag. Bleeding: None.  Movement: Present. Denies leaking of fluid.   The following portions of the patient's history were reviewed and updated as appropriate: allergies, current medications, past family history, past medical history, past social history, past surgical history and problem list. Problem list updated.  Objective:   Vitals:   04/17/16 1014  BP: 107/72  Pulse: 83  Weight: 172 lb 3.2 oz (78.1 kg)    Fetal Status: Fetal Heart Rate (bpm): 133   Movement: Present     General:  Alert, oriented and cooperative. Patient is in no acute distress.  Skin: Skin is warm and dry. No rash noted.   Cardiovascular: Normal heart rate noted  Respiratory: Normal respiratory effort, no problems with respiration noted  Abdomen: Soft, gravid, appropriate for gestational age. Pain/Pressure: Absent     Pelvic:  Cervical exam deferred        Extremities: Normal range of motion.  Edema: None  Mental Status: Normal mood and affect. Normal behavior. Normal judgment and thought content.   Urinalysis:      Assessment and Plan:  Pregnancy: [id number] at 2377w1d  1. Supervision of high risk pregnancy, antepartum  - Prenatal Vit-Fe Fumarate-FA (PREPLUS) 27-1 MG TABS; Take 1 tablet by mouth daily.  Dispense: 30 tablet; Refill: 13  2. Hx of preeclampsia, prior pregnancy, currently pregnant  - aspirin EC 81 MG tablet; Take 1 tablet (81 mg total) by mouth daily.   Dispense: 30 tablet; Refill: 11  3. History of cesarean delivery, antepartum C/S x 2  Repeat c section at 39 weeks   4. Hyperemesis gravidarum, mild, before 23rd week improving  5. UPJ (ureteropelvic junction) obstruction F/U U/S already ordered. MFM to refer to peds urology  Preterm labor symptoms and general obstetric precautions including but not limited to vaginal bleeding, contractions, leaking of fluid and fetal movement were reviewed in detail with the patient. Please refer to After Visit Summary for other counseling recommendations.  Return in about 4 weeks (around 05/15/2016) for OB visit.   Hermina StaggersMichael L Tamsyn Owusu, MD

## 2016-04-29 ENCOUNTER — Encounter (HOSPITAL_COMMUNITY): Payer: Self-pay

## 2016-04-30 ENCOUNTER — Ambulatory Visit (HOSPITAL_COMMUNITY)
Admission: RE | Admit: 2016-04-30 | Discharge: 2016-04-30 | Disposition: A | Discharge status: Home / Self Care | Payer: Medicaid Other | Source: Ambulatory Visit | Attending: Obstetrics & Gynecology | Admitting: Obstetrics & Gynecology

## 2016-04-30 ENCOUNTER — Other Ambulatory Visit (HOSPITAL_COMMUNITY): Payer: Self-pay | Admitting: *Deleted

## 2016-04-30 ENCOUNTER — Other Ambulatory Visit (HOSPITAL_COMMUNITY): Payer: Self-pay | Admitting: Maternal and Fetal Medicine

## 2016-04-30 ENCOUNTER — Encounter (HOSPITAL_COMMUNITY): Payer: Self-pay

## 2016-04-30 DIAGNOSIS — O358XX0 Maternal care for other (suspected) fetal abnormality and damage, not applicable or unspecified: Secondary | ICD-10-CM | POA: Diagnosis not present

## 2016-04-30 DIAGNOSIS — O09899 Supervision of other high risk pregnancies, unspecified trimester: Secondary | ICD-10-CM

## 2016-04-30 DIAGNOSIS — Z862 Personal history of diseases of the blood and blood-forming organs and certain disorders involving the immune mechanism: Secondary | ICD-10-CM

## 2016-04-30 DIAGNOSIS — O34211 Maternal care for low transverse scar from previous cesarean delivery: Secondary | ICD-10-CM | POA: Diagnosis not present

## 2016-04-30 DIAGNOSIS — O09292 Supervision of pregnancy with other poor reproductive or obstetric history, second trimester: Secondary | ICD-10-CM | POA: Insufficient documentation

## 2016-04-30 DIAGNOSIS — O35EXX0 Maternal care for other (suspected) fetal abnormality and damage, fetal genitourinary anomalies, not applicable or unspecified: Secondary | ICD-10-CM

## 2016-04-30 DIAGNOSIS — O09212 Supervision of pregnancy with history of pre-term labor, second trimester: Secondary | ICD-10-CM | POA: Diagnosis not present

## 2016-04-30 DIAGNOSIS — Z3A25 25 weeks gestation of pregnancy: Secondary | ICD-10-CM | POA: Insufficient documentation

## 2016-04-30 DIAGNOSIS — O09219 Supervision of pregnancy with history of pre-term labor, unspecified trimester: Secondary | ICD-10-CM

## 2016-04-30 DIAGNOSIS — IMO0001 Reserved for inherently not codable concepts without codable children: Secondary | ICD-10-CM

## 2016-04-30 HISTORY — DX: Sickle-cell trait: D57.3

## 2016-05-15 ENCOUNTER — Other Ambulatory Visit: Payer: Medicaid Other

## 2016-05-15 ENCOUNTER — Encounter: Payer: Medicaid Other | Admitting: Family Medicine

## 2016-05-23 ENCOUNTER — Ambulatory Visit (INDEPENDENT_AMBULATORY_CARE_PROVIDER_SITE_OTHER): Payer: Medicaid Other | Admitting: Obstetrics and Gynecology

## 2016-05-23 ENCOUNTER — Other Ambulatory Visit: Payer: Medicaid Other

## 2016-05-23 VITALS — BP 99/69 | HR 67 | Wt 177.0 lb

## 2016-05-23 DIAGNOSIS — Z23 Encounter for immunization: Secondary | ICD-10-CM | POA: Diagnosis not present

## 2016-05-23 DIAGNOSIS — O09299 Supervision of pregnancy with other poor reproductive or obstetric history, unspecified trimester: Secondary | ICD-10-CM

## 2016-05-23 DIAGNOSIS — N135 Crossing vessel and stricture of ureter without hydronephrosis: Secondary | ICD-10-CM

## 2016-05-23 DIAGNOSIS — O0990 Supervision of high risk pregnancy, unspecified, unspecified trimester: Secondary | ICD-10-CM

## 2016-05-23 DIAGNOSIS — O34219 Maternal care for unspecified type scar from previous cesarean delivery: Secondary | ICD-10-CM

## 2016-05-23 DIAGNOSIS — I10 Essential (primary) hypertension: Secondary | ICD-10-CM

## 2016-05-23 DIAGNOSIS — O09293 Supervision of pregnancy with other poor reproductive or obstetric history, third trimester: Secondary | ICD-10-CM

## 2016-05-23 MED ORDER — ZOLPIDEM TARTRATE 10 MG PO TABS: 10.0000 mg | ORAL_TABLET | Freq: Every evening | ORAL | 3 refills | Status: DC | PRN

## 2016-05-23 NOTE — Progress Notes (Signed)
Patient is in the office, reports good fetal movement, complains of difficulty sleeping.

## 2016-05-23 NOTE — Progress Notes (Signed)
   PRENATAL VISIT NOTE  Subjective:  Latina Cravershley N Doggett is a 26 y.o. [phone] at 3523w2d being seen today for ongoing prenatal care.  She is currently monitored for the following issues for this high-risk pregnancy and has Hx of preeclampsia, prior pregnancy, currently pregnant; Supervision of high risk pregnancy, antepartum; History of cesarean delivery, antepartum; Hyperemesis gravidarum, mild, before 23rd week; Sickle cell trait (HCC); Hypertension; and UPJ (ureteropelvic junction) obstruction on her problem list.  Patient reports insomina.  Contractions: Not present. Vag. Bleeding: None.  Movement: Present. Denies leaking of fluid.   The following portions of the patient's history were reviewed and updated as appropriate: allergies, current medications, past family history, past medical history, past social history, past surgical history and problem list. Problem list updated.  Objective:   Vitals:   05/23/16 0828  BP: 99/69  Pulse: 67  Weight: 177 lb (80.3 kg)    Fetal Status: Fetal Heart Rate (bpm): 134 Fundal Height: 29 cm Movement: Present     General:  Alert, oriented and cooperative. Patient is in no acute distress.  Skin: Skin is warm and dry. No rash noted.   Cardiovascular: Normal heart rate noted  Respiratory: Normal respiratory effort, no problems with respiration noted  Abdomen: Soft, gravid, appropriate for gestational age. Pain/Pressure: Present     Pelvic:  Cervical exam deferred        Extremities: Normal range of motion.  Edema: None  Mental Status: Normal mood and affect. Normal behavior. Normal judgment and thought content.   Assessment and Plan:  Pregnancy: [id number] at 4123w2d  1. Supervision of high risk pregnancy, antepartum Patient is doing well without complaints Third trimester labs, glucola and tdap today - Glucose Tolerance, 2 Hours w/1 Hour - CBC - HIV antibody (with reflex) - RPR  2. Essential hypertension Normotensive  3. History of cesarean  delivery, antepartum Will schedule for repeat at 39 weeks with BTL  4. Hx of preeclampsia, prior pregnancy, currently pregnant Continue daily ASA  5. UPJ (ureteropelvic junction) obstruction Follow up ultrasound ordered  Preterm labor symptoms and general obstetric precautions including but not limited to vaginal bleeding, contractions, leaking of fluid and fetal movement were reviewed in detail with the patient. Please refer to After Visit Summary for other counseling recommendations.  No Follow-up on file.   Catalina AntiguaPeggy Ronnie Mallette, MD

## 2016-05-24 LAB — CBC
HEMATOCRIT: 35.3 % (ref 34.0–46.6)
HEMOGLOBIN: 11.4 g/dL (ref 11.1–15.9)
MCH: 26.2 pg — AB (ref 26.6–33.0)
MCHC: 32.3 g/dL (ref 31.5–35.7)
MCV: 81 fL (ref 79–97)
Platelets: 104 10*3/uL — ABNORMAL LOW (ref 150–379)
RBC: 4.35 x10E6/uL (ref 3.77–5.28)
RDW: 13.9 % (ref 12.3–15.4)
WBC: 7.1 10*3/uL (ref 3.4–10.8)

## 2016-05-24 LAB — RPR: RPR: NONREACTIVE

## 2016-05-24 LAB — GLUCOSE TOLERANCE, 2 HOURS W/ 1HR
Glucose, 1 hour: 115 mg/dL (ref 65–179)
Glucose, 2 hour: 99 mg/dL (ref 65–152)
Glucose, Fasting: 73 mg/dL (ref 65–91)

## 2016-05-24 LAB — HIV ANTIBODY (ROUTINE TESTING W REFLEX): HIV Screen 4th Generation wRfx: NONREACTIVE

## 2016-06-02 ENCOUNTER — Encounter (HOSPITAL_COMMUNITY): Payer: Self-pay

## 2016-06-03 ENCOUNTER — Telehealth: Payer: Self-pay | Admitting: *Deleted

## 2016-06-06 ENCOUNTER — Encounter: Payer: Medicaid Other | Admitting: Certified Nurse Midwife

## 2016-06-10 NOTE — Telephone Encounter (Signed)
error 

## 2016-06-11 ENCOUNTER — Ambulatory Visit (HOSPITAL_COMMUNITY)
Admission: RE | Admit: 2016-06-11 | Discharge: 2016-06-11 | Disposition: A | Discharge status: Home / Self Care | Payer: Medicaid Other | Source: Ambulatory Visit | Attending: Obstetrics & Gynecology | Admitting: Obstetrics & Gynecology

## 2016-06-11 ENCOUNTER — Encounter (HOSPITAL_COMMUNITY): Payer: Self-pay

## 2016-06-11 ENCOUNTER — Other Ambulatory Visit (HOSPITAL_COMMUNITY): Payer: Self-pay | Admitting: *Deleted

## 2016-06-11 DIAGNOSIS — O34211 Maternal care for low transverse scar from previous cesarean delivery: Secondary | ICD-10-CM | POA: Diagnosis not present

## 2016-06-11 DIAGNOSIS — IMO0001 Reserved for inherently not codable concepts without codable children: Secondary | ICD-10-CM

## 2016-06-11 DIAGNOSIS — O09213 Supervision of pregnancy with history of pre-term labor, third trimester: Secondary | ICD-10-CM | POA: Insufficient documentation

## 2016-06-11 DIAGNOSIS — O358XX0 Maternal care for other (suspected) fetal abnormality and damage, not applicable or unspecified: Secondary | ICD-10-CM | POA: Diagnosis not present

## 2016-06-11 DIAGNOSIS — Z3A31 31 weeks gestation of pregnancy: Secondary | ICD-10-CM | POA: Diagnosis not present

## 2016-06-11 DIAGNOSIS — Z862 Personal history of diseases of the blood and blood-forming organs and certain disorders involving the immune mechanism: Secondary | ICD-10-CM | POA: Diagnosis not present

## 2016-06-11 DIAGNOSIS — O09293 Supervision of pregnancy with other poor reproductive or obstetric history, third trimester: Secondary | ICD-10-CM | POA: Insufficient documentation

## 2016-06-11 DIAGNOSIS — O359XX0 Maternal care for (suspected) fetal abnormality and damage, unspecified, not applicable or unspecified: Secondary | ICD-10-CM

## 2016-06-30 ENCOUNTER — Ambulatory Visit (INDEPENDENT_AMBULATORY_CARE_PROVIDER_SITE_OTHER): Payer: Medicaid Other | Admitting: Certified Nurse Midwife

## 2016-06-30 VITALS — BP 109/70 | HR 80 | Wt 180.8 lb

## 2016-06-30 DIAGNOSIS — I10 Essential (primary) hypertension: Secondary | ICD-10-CM

## 2016-06-30 DIAGNOSIS — O99013 Anemia complicating pregnancy, third trimester: Secondary | ICD-10-CM

## 2016-06-30 DIAGNOSIS — D573 Sickle-cell trait: Secondary | ICD-10-CM

## 2016-06-30 DIAGNOSIS — O0990 Supervision of high risk pregnancy, unspecified, unspecified trimester: Secondary | ICD-10-CM

## 2016-06-30 DIAGNOSIS — O09299 Supervision of pregnancy with other poor reproductive or obstetric history, unspecified trimester: Secondary | ICD-10-CM

## 2016-06-30 DIAGNOSIS — O10913 Unspecified pre-existing hypertension complicating pregnancy, third trimester: Secondary | ICD-10-CM

## 2016-06-30 DIAGNOSIS — O09293 Supervision of pregnancy with other poor reproductive or obstetric history, third trimester: Secondary | ICD-10-CM

## 2016-06-30 NOTE — Progress Notes (Signed)
   PRENATAL VISIT NOTE  Subjective:  Renee Suarez is a 26 y.o. [phone] at 8331w5d being seen today for ongoing prenatal care.  She is currently monitored for the following issues for this high-risk pregnancy and has Hx of preeclampsia, prior pregnancy, currently pregnant; Supervision of high risk pregnancy, antepartum; History of cesarean delivery, antepartum; Sickle cell trait (HCC); Hypertension; and UPJ (ureteropelvic junction) obstruction on her problem list.  Patient reports no bleeding, no contractions, no cramping, no leaking and vertigo.  Contractions: Irregular. Vag. Bleeding: None.  Movement: Present. Denies leaking of fluid.   The following portions of the patient's history were reviewed and updated as appropriate: allergies, current medications, past family history, past medical history, past social history, past surgical history and problem list. Problem list updated.  Objective:   Vitals:   06/30/16 1535  BP: 109/70  Pulse: 80  Weight: 180 lb 12.8 oz (82 kg)    Fetal Status:     Movement: Present     General:  Alert, oriented and cooperative. Patient is in no acute distress.  Skin: Skin is warm and dry. No rash noted.   Cardiovascular: Normal heart rate noted  Respiratory: Normal respiratory effort, no problems with respiration noted  Abdomen: Soft, gravid, appropriate for gestational age. Pain/Pressure: Present     Pelvic:  Cervical exam deferred        Extremities: Normal range of motion.  Edema: None  Mental Status: Normal mood and affect. Normal behavior. Normal judgment and thought content.   NST: + accels, no decels, moderate variability, Cat. 1 tracing. No contractions on toco.   Assessment and Plan:  Pregnancy: [id number] at 3831w5d  1. Supervision of high risk pregnancy, antepartum      Has f/u US scheduled for 07/09/16.  Reactive NST - Fetal nonstress test; Future  2. Essential hypertension    Reactive NST.  - Fetal nonstress test; Future  3. Sickle cell  trait (HCC)     Urine culture ordered  4. Hx of preeclampsia, prior pregnancy, currently pregnant     Taking baby ASA daily  Preterm labor symptoms and general obstetric precautions including but not limited to vaginal bleeding, contractions, leaking of fluid and fetal movement were reviewed in detail with the patient. Please refer to After Visit Summary for other counseling recommendations.  [facility] about [facility] (around [facility]07/2016[facility] till delivery.   Roe Coombsachelle A Yoceline Bazar, CNM.

## 2016-06-30 NOTE — Progress Notes (Signed)
Patient is getting dizzy 3-4 times/day

## 2016-07-02 ENCOUNTER — Encounter (HOSPITAL_COMMUNITY): Payer: Self-pay

## 2016-07-02 ENCOUNTER — Inpatient Hospital Stay (HOSPITAL_COMMUNITY)
Admission: AD | Admit: 2016-07-02 | Discharge: 2016-07-02 | Disposition: A | Discharge status: Home / Self Care | Payer: Medicaid Other | Source: Ambulatory Visit | Attending: Obstetrics & Gynecology | Admitting: Obstetrics & Gynecology

## 2016-07-02 DIAGNOSIS — Z87891 Personal history of nicotine dependence: Secondary | ICD-10-CM | POA: Insufficient documentation

## 2016-07-02 DIAGNOSIS — M545 Low back pain: Secondary | ICD-10-CM | POA: Diagnosis present

## 2016-07-02 DIAGNOSIS — Z7982 Long term (current) use of aspirin: Secondary | ICD-10-CM | POA: Insufficient documentation

## 2016-07-02 DIAGNOSIS — O9989 Other specified diseases and conditions complicating pregnancy, childbirth and the puerperium: Secondary | ICD-10-CM

## 2016-07-02 DIAGNOSIS — Z3A34 34 weeks gestation of pregnancy: Secondary | ICD-10-CM | POA: Diagnosis not present

## 2016-07-02 DIAGNOSIS — O99891 Other specified diseases and conditions complicating pregnancy: Secondary | ICD-10-CM

## 2016-07-02 DIAGNOSIS — O26893 Other specified pregnancy related conditions, third trimester: Secondary | ICD-10-CM

## 2016-07-02 DIAGNOSIS — M549 Dorsalgia, unspecified: Secondary | ICD-10-CM | POA: Diagnosis not present

## 2016-07-02 DIAGNOSIS — Z885 Allergy status to narcotic agent status: Secondary | ICD-10-CM | POA: Insufficient documentation

## 2016-07-02 LAB — URINALYSIS, ROUTINE W REFLEX MICROSCOPIC
BILIRUBIN URINE: NEGATIVE
GLUCOSE, UA: NEGATIVE mg/dL
HGB URINE DIPSTICK: NEGATIVE
Ketones, ur: NEGATIVE mg/dL
LEUKOCYTES UA: NEGATIVE
Nitrite: NEGATIVE
PH: 7 (ref 5.0–8.0)
Protein, ur: NEGATIVE mg/dL
Specific Gravity, Urine: 1.009 (ref 1.005–1.030)

## 2016-07-02 MED ORDER — CYCLOBENZAPRINE HCL 10 MG PO TABS: 10.0000 mg | ORAL_TABLET | Freq: Two times a day (BID) | ORAL | 0 refills | Status: DC | PRN

## 2016-07-02 MED ORDER — CYCLOBENZAPRINE HCL 10 MG PO TABS
10.0000 mg | ORAL_TABLET | Freq: Once | ORAL | Status: AC
Start: 2016-07-02 — End: 2016-07-02
  Administered 2016-07-02: 10 mg via ORAL
  Filled 2016-07-02: qty 1

## 2016-07-02 NOTE — MAU Provider Note (Signed)
History     CSN: [id number]  Arrival date and time: 07/02/16 1478   First Provider Initiated Contact with Patient 07/02/16 [phone]      Chief Complaint  Patient presents with  . Back Pain   HPI   [name] is a 26 y.o. female [redacted]w[redacted]d @ [redacted]w[redacted]d here in MAU with complaints of lower back pain that started at 0500 this morning. The pain is constant. The pain is located in one spot. She has never had this pain before "thats why I am here". She denies vaginal bleeding. + fetal movement.   History of preeclampsia and preterm birth. "They took my baby early because I was having seizures".   OB History    Gravida Para Term Preterm AB Living   4 2 1 1 1 2    SAB TAB Ectopic Multiple Live Births   0 1 0 0 2      Past Medical History:  Diagnosis Date  . Anemia   . Eclampsia    with 1st preg  . GERD (gastroesophageal reflux disease)    post partum 1st preg.  [name] Headache   . Hypertension   . Pregnancy induced hypertension   . Preterm delivery   . Preterm labor   . Sickle cell trait [facility])     Past Surgical History:  Procedure Laterality Date  . CESAREAN SECTION    . CESAREAN SECTION  04/25/2011   Procedure: CESAREAN SECTION;  Surgeon: Tereso Newcomer, MD;  Location: [facility];  Service: Gynecology;  Laterality: N/A;  Repeat C/Section  . CHOLECYSTECTOMY    . CHOLECYSTECTOMY  2010    Family History  Problem Relation Age of Onset  . Hypertension Paternal Grandmother   . Diabetes Paternal Grandmother   . Heart disease Paternal Grandmother   . Diabetes Mother   . Cancer Paternal Grandfather     pancreatic  . Mental illness Brother   . Mental retardation Brother     Social History  Substance Use Topics  . Smoking status: Former Games developer  . Smokeless tobacco: Never Used  . Alcohol use No    Allergies:  Allergies  Allergen Reactions  . Percocet [Oxycodone-Acetaminophen] Hives    Prescriptions Prior to Admission  Medication Sig Dispense Refill Last Dose  .  acetaminophen (TYLENOL) 325 MG tablet Take 650 mg by mouth every 6 (six) hours as needed for mild pain, moderate pain or headache.   Past Week at Unknown time  . aspirin EC 81 MG tablet Take 1 tablet (81 mg total) by mouth daily. 30 tablet 11 07/01/2016 at Unknown time  . ondansetron (ZOFRAN ODT) 4 MG disintegrating tablet Take 1 tablet (4 mg total) by mouth every 6 (six) hours as needed for nausea. 20 tablet 0 07/01/2016 at Unknown time  . Prenatal Vit-Fe Fumarate-FA (PREPLUS) 27-1 MG TABS Take 1 tablet by mouth daily. 30 tablet 13 07/01/2016 at Unknown time  . zolpidem (AMBIEN) 10 MG tablet Take 1 tablet (10 mg total) by mouth at bedtime as needed for sleep. 30 tablet 3 Past Week at Unknown time  . glycopyrrolate (ROBINUL) 1 MG tablet Take 1 tablet (1 mg total) by mouth 3 (three) times daily. (Patient not taking: Reported on 04/30/2016) 90 tablet 2 Not Taking  . promethazine (PHENERGAN) 25 MG tablet Take 1 tablet (25 mg total) by mouth every 6 (six) hours as needed for nausea or vomiting. (Patient not taking: Reported on 04/30/2016) 30 tablet 0 Not Taking   Results for orders placed  or performed during the hospital encounter of 07/02/16 (from the past 48 hour(s))  Urinalysis, Routine w reflex microscopic     Status: None   Collection Time: 07/02/16  8:08 AM  Result Value Ref Range   Color, Urine YELLOW YELLOW   APPearance CLEAR CLEAR   Specific Gravity, Urine 1.009 1.005 - 1.030   pH 7.0 5.0 - 8.0   Glucose, UA NEGATIVE NEGATIVE mg/dL   Hgb urine dipstick NEGATIVE NEGATIVE   Bilirubin Urine NEGATIVE NEGATIVE   Ketones, ur NEGATIVE NEGATIVE mg/dL   Protein, ur NEGATIVE NEGATIVE mg/dL   Nitrite NEGATIVE NEGATIVE   Leukocytes, UA NEGATIVE NEGATIVE    Review of Systems  Gastrointestinal: Negative for abdominal pain.  Genitourinary: Negative for dysuria, flank pain and urgency.  Musculoskeletal: Positive for back pain.   Physical Exam   Blood pressure 110/70, temperature 98.2 F (36.8 C), resp.  rate 18, last menstrual period 11/20/2015, unknown if currently breastfeeding.  Physical Exam  Constitutional: She appears well-developed and well-nourished. No distress.  HENT:  Head: Normocephalic.  Eyes: Pupils are equal, round, and reactive to light.  Respiratory: Effort normal.  GI: Soft.  Genitourinary:  Genitourinary Comments: Dilation: Closed Effacement (%): Thick Cervical Position: Posterior Exam by:: Shela CommonsJ. Jermel Artley NP  Musculoskeletal: Normal range of motion.       Lumbar back: She exhibits tenderness. She exhibits no edema, no deformity and no spasm.  Skin: She is not diaphoretic.   Fetal Tracing: Baseline: 120 bpm Variability: Moderate  Accelerations: 15x15 Decelerations: None Toco: UI  MAU Course  Procedures  None  MDM  UA Patient denies problems with bowel/bladder  Flexeril given PO  Pain down to 2/10 from 7/10.  Assessment and Plan   A:  1. Back pain affecting pregnancy in third trimester     P:  Discharge home in stable condition  Rx: Flexeril Return to MAU if symptoms worsen Heat/Cold alternate Pregnancy support belt recommended   Duane LopeJennifer I Cierrah Dace, NP 07/02/2016 11:00 AM

## 2016-07-02 NOTE — Discharge Instructions (Signed)

## 2016-07-02 NOTE — MAU Note (Signed)
Pt presents to MAU with back pain. Reports pain is midline and constant. Began around 0500. States she was throwing up all day yesterday, was wondering if it was from that. Denies taking any medication for it. Denies any bleeding or leaking of fluid. Reports good fetal movement.

## 2016-07-04 LAB — URINE CULTURE, OB REFLEX

## 2016-07-04 LAB — CULTURE, OB URINE

## 2016-07-07 ENCOUNTER — Other Ambulatory Visit: Payer: Self-pay | Admitting: Certified Nurse Midwife

## 2016-07-07 DIAGNOSIS — O2343 Unspecified infection of urinary tract in pregnancy, third trimester: Secondary | ICD-10-CM

## 2016-07-07 MED ORDER — NITROFURANTOIN MONOHYD MACRO 100 MG PO CAPS: 100.0000 mg | ORAL_CAPSULE | Freq: Two times a day (BID) | ORAL | 0 refills | Status: DC

## 2016-07-09 ENCOUNTER — Encounter (HOSPITAL_COMMUNITY): Payer: Self-pay

## 2016-07-09 ENCOUNTER — Ambulatory Visit (HOSPITAL_COMMUNITY)
Admission: RE | Admit: 2016-07-09 | Discharge: 2016-07-09 | Disposition: A | Discharge status: Home / Self Care | Payer: Medicaid Other | Source: Ambulatory Visit | Attending: Obstetrics & Gynecology | Admitting: Obstetrics & Gynecology

## 2016-07-09 DIAGNOSIS — O09213 Supervision of pregnancy with history of pre-term labor, third trimester: Secondary | ICD-10-CM | POA: Insufficient documentation

## 2016-07-09 DIAGNOSIS — O358XX0 Maternal care for other (suspected) fetal abnormality and damage, not applicable or unspecified: Secondary | ICD-10-CM | POA: Diagnosis not present

## 2016-07-09 DIAGNOSIS — O34211 Maternal care for low transverse scar from previous cesarean delivery: Secondary | ICD-10-CM | POA: Diagnosis not present

## 2016-07-09 DIAGNOSIS — O09293 Supervision of pregnancy with other poor reproductive or obstetric history, third trimester: Secondary | ICD-10-CM | POA: Diagnosis present

## 2016-07-09 DIAGNOSIS — Z3A35 35 weeks gestation of pregnancy: Secondary | ICD-10-CM | POA: Diagnosis not present

## 2016-07-09 DIAGNOSIS — O359XX0 Maternal care for (suspected) fetal abnormality and damage, unspecified, not applicable or unspecified: Secondary | ICD-10-CM

## 2016-07-14 ENCOUNTER — Encounter: Payer: Medicaid Other | Admitting: Obstetrics and Gynecology

## 2016-07-14 ENCOUNTER — Other Ambulatory Visit: Payer: Medicaid Other

## 2016-07-14 ENCOUNTER — Telehealth: Payer: Self-pay

## 2016-07-14 NOTE — Telephone Encounter (Signed)
Attempted to contact patient in order to reschedule her OB appointment that was missed on 07/14/2016 at 8:45 am. Left voicemail for patient to call once she gets the message in order to reschedule the appointment.

## 2016-07-17 ENCOUNTER — Encounter: Payer: Medicaid Other | Admitting: Obstetrics and Gynecology

## 2016-07-24 DIAGNOSIS — O358XX0 Maternal care for other (suspected) fetal abnormality and damage, not applicable or unspecified: Secondary | ICD-10-CM | POA: Diagnosis not present

## 2016-07-24 DIAGNOSIS — Z3A38 38 weeks gestation of pregnancy: Secondary | ICD-10-CM | POA: Diagnosis not present

## 2016-07-31 ENCOUNTER — Encounter (HOSPITAL_COMMUNITY): Payer: Self-pay

## 2016-08-05 ENCOUNTER — Encounter (HOSPITAL_COMMUNITY)
Admission: RE | Admit: 2016-08-05 | Discharge: 2016-08-05 | Disposition: A | Discharge status: Home / Self Care | Payer: Medicaid Other | Source: Ambulatory Visit | Attending: Family Medicine | Admitting: Family Medicine

## 2016-08-05 LAB — CBC
HCT: 33.3 % — ABNORMAL LOW (ref 36.0–46.0)
Hemoglobin: 11.2 g/dL — ABNORMAL LOW (ref 12.0–15.0)
MCH: 26.3 pg (ref 26.0–34.0)
MCHC: 33.6 g/dL (ref 30.0–36.0)
MCV: 78.2 fL (ref 78.0–100.0)
Platelets: 98 10*3/uL — ABNORMAL LOW (ref 150–400)
RBC: 4.26 MIL/uL (ref 3.87–5.11)
RDW: 13.6 % (ref 11.5–15.5)
WBC: 5.8 10*3/uL (ref 4.0–10.5)

## 2016-08-05 LAB — TYPE AND SCREEN
ABO/RH(D): A POS
ANTIBODY SCREEN: NEGATIVE

## 2016-08-05 NOTE — H&P (Addendum)
Renee Suarez is an 26 y.o. [phone] [redacted]w[redacted]d female.   Chief Complaint: Needs c-section HPI: H/o prior csection x 2. Desires elective repeat. At 39 wks.  Past Medical History:  Diagnosis Date  . Anemia   . Eclampsia    with 1st preg  . GERD (gastroesophageal reflux disease)    post partum 1st preg.  [name] Headache   . Hypertension   . Pregnancy induced hypertension   . Preterm delivery   . Preterm labor   . Sickle cell trait [facility])     Past Surgical History:  Procedure Laterality Date  . CESAREAN SECTION    . CESAREAN SECTION  04/25/2011   Procedure: CESAREAN SECTION;  Surgeon: Tereso Newcomer, MD;  Location: [facility];  Service: Gynecology;  Laterality: N/A;  Repeat C/Section  . CHOLECYSTECTOMY    . CHOLECYSTECTOMY  2010    Family History  Problem Relation Age of Onset  . Hypertension Paternal Grandmother   . Diabetes Paternal Grandmother   . Heart disease Paternal Grandmother   . Diabetes Mother   . Cancer Paternal Grandfather     pancreatic  . Mental illness Brother   . Mental retardation Brother    Social History:  reports that she has quit smoking. She has never used smokeless tobacco. She reports that she does not drink alcohol or use drugs.    Allergies  Allergen Reactions  . Percocet [Oxycodone-Acetaminophen] Hives    No current facility-administered medications on file prior to encounter.    Current Outpatient Prescriptions on File Prior to Encounter  Medication Sig Dispense Refill  . aspirin EC 81 MG tablet Take 1 tablet (81 mg total) by mouth daily. 30 tablet 11  . Prenatal Vit-Fe Fumarate-FA (PREPLUS) 27-1 MG TABS Take 1 tablet by mouth daily. 30 tablet 13  . ondansetron (ZOFRAN ODT) 4 MG disintegrating tablet Take 1 tablet (4 mg total) by mouth every 6 (six) hours as needed for nausea. (Patient not taking: Reported on 07/09/2016) 20 tablet 0  . zolpidem (AMBIEN) 10 MG tablet Take 1 tablet (10 mg total) by mouth at bedtime as needed for sleep. (Patient not  taking: Reported on 07/30/2016) 30 tablet 3    A comprehensive review of systems was negative.  Last menstrual period 11/20/2015, unknown if currently breastfeeding. LMP 11/20/2015  General appearance: alert, cooperative and appears stated age Head: Normocephalic, without obvious abnormality, atraumatic Neck: supple, symmetrical, trachea midline Lungs: normal effort Heart: regular rate and rhythm Abdomen: gravid, Non-tender Extremities: Homans sign is negative, no sign of DVT Skin: Skin color, texture, turgor normal. No rashes or lesions Neurologic: Grossly normal   Lab Results  Component Value Date   WBC 5.8 08/05/2016   HGB 11.2 (L) 08/05/2016   HCT 33.3 (L) 08/05/2016   MCV 78.2 08/05/2016   PLT 98 (L) 08/05/2016         ABO, Rh: --/--/A POS (04/10 4540)  Antibody: NEG (04/10 0947)  Rubella: !Error!  RPR: Non Reactive (01/26 1010)  HBsAg: Negative (09/26 1642)  HIV: Non Reactive (01/26 1010)  GBS:   unknown    Assessment/Plan Principal Problem:   History of cesarean delivery, antepartum  For RLTCS and BTL  Reva Bores 08/05/2016, 5:42 PM

## 2016-08-05 NOTE — Pre-Procedure Instructions (Signed)
Discussed pt medications with Dr Adrian Blackwater via the phone.  Order received that she is not to take any medications tonight.

## 2016-08-05 NOTE — Patient Instructions (Signed)
[address]  08/05/2016   Your procedure is scheduled on:  08/06/2016  Enter through the Main Entrance of [facility] at 0730 AM.  Pick up the phone at the desk and dial [phone].   Call this number if you have problems the morning of surgery: [phone]   Remember:   Do not eat food:After Midnight.  Do not drink clear liquids: After Midnight.  Take these medicines the morning of surgery with A SIP OF WATER: none   Do not wear jewelry, make-up or nail polish.  Do not wear lotions, powders, or perfumes. Do not wear deodorant.  Do not shave 48 hours prior to surgery.  Do not bring valuables to the hospital.  [facility] is not   responsible for any belongings or valuables brought to the hospital.  Contacts, dentures or bridgework may not be worn into surgery.  Leave suitcase in the car. After surgery it may be brought to your room.  For patients admitted to the hospital, checkout time is 11:00 AM the day of              discharge.   Patients discharged the day of surgery will not be allowed to drive             home.  Name and phone number of your driver: na  Special Instructions:   N/A   Please read over the following fact sheets that you were given:   Surgical Site Infection Prevention

## 2016-08-06 ENCOUNTER — Inpatient Hospital Stay (HOSPITAL_COMMUNITY)
Admission: RE | Admit: 2016-08-06 | Discharge: 2016-08-08 | DRG: 766 | Disposition: A | Discharge status: Home / Self Care | Payer: Medicaid Other | Source: Ambulatory Visit | Attending: Family Medicine | Admitting: Family Medicine

## 2016-08-06 ENCOUNTER — Encounter (HOSPITAL_COMMUNITY): Payer: Self-pay

## 2016-08-06 ENCOUNTER — Encounter (HOSPITAL_COMMUNITY)
Admission: RE | Disposition: A | Discharge status: Home / Self Care | Payer: Self-pay | Source: Ambulatory Visit | Attending: Family Medicine

## 2016-08-06 ENCOUNTER — Encounter (HOSPITAL_COMMUNITY): Payer: Self-pay | Admitting: *Deleted

## 2016-08-06 ENCOUNTER — Inpatient Hospital Stay (HOSPITAL_COMMUNITY): Payer: Medicaid Other | Admitting: Anesthesiology

## 2016-08-06 DIAGNOSIS — O9962 Diseases of the digestive system complicating childbirth: Secondary | ICD-10-CM | POA: Diagnosis present

## 2016-08-06 DIAGNOSIS — K219 Gastro-esophageal reflux disease without esophagitis: Secondary | ICD-10-CM | POA: Diagnosis present

## 2016-08-06 DIAGNOSIS — Z302 Encounter for sterilization: Secondary | ICD-10-CM

## 2016-08-06 DIAGNOSIS — Z3A39 39 weeks gestation of pregnancy: Secondary | ICD-10-CM

## 2016-08-06 DIAGNOSIS — D573 Sickle-cell trait: Secondary | ICD-10-CM | POA: Diagnosis present

## 2016-08-06 DIAGNOSIS — Z833 Family history of diabetes mellitus: Secondary | ICD-10-CM

## 2016-08-06 DIAGNOSIS — Z8249 Family history of ischemic heart disease and other diseases of the circulatory system: Secondary | ICD-10-CM | POA: Diagnosis not present

## 2016-08-06 DIAGNOSIS — O9902 Anemia complicating childbirth: Secondary | ICD-10-CM | POA: Diagnosis present

## 2016-08-06 DIAGNOSIS — O34219 Maternal care for unspecified type scar from previous cesarean delivery: Secondary | ICD-10-CM

## 2016-08-06 DIAGNOSIS — Z87891 Personal history of nicotine dependence: Secondary | ICD-10-CM | POA: Diagnosis not present

## 2016-08-06 DIAGNOSIS — O34211 Maternal care for low transverse scar from previous cesarean delivery: Principal | ICD-10-CM | POA: Diagnosis present

## 2016-08-06 LAB — CBC
HEMATOCRIT: 32.9 % — AB (ref 36.0–46.0)
Hemoglobin: 11 g/dL — ABNORMAL LOW (ref 12.0–15.0)
MCH: 26.1 pg (ref 26.0–34.0)
MCHC: 33.4 g/dL (ref 30.0–36.0)
MCV: 78.1 fL (ref 78.0–100.0)
PLATELETS: 97 10*3/uL — AB (ref 150–400)
RBC: 4.21 MIL/uL (ref 3.87–5.11)
RDW: 13.7 % (ref 11.5–15.5)
WBC: 5.5 10*3/uL (ref 4.0–10.5)

## 2016-08-06 LAB — RPR: RPR Ser Ql: NONREACTIVE

## 2016-08-06 SURGERY — Surgical Case
Anesthesia: Epidural | Site: Abdomen | Laterality: Bilateral | Wound class: Clean Contaminated

## 2016-08-06 MED ORDER — DEXAMETHASONE SODIUM PHOSPHATE 4 MG/ML IJ SOLN
INTRAMUSCULAR | Status: AC
  Filled 2016-08-06: qty 1

## 2016-08-06 MED ORDER — PHENYLEPHRINE 40 MCG/ML (10ML) SYRINGE FOR IV PUSH (FOR BLOOD PRESSURE SUPPORT)
PREFILLED_SYRINGE | INTRAVENOUS | Status: AC
  Filled 2016-08-06: qty 10

## 2016-08-06 MED ORDER — FENTANYL CITRATE (PF) 100 MCG/2ML IJ SOLN
INTRAMUSCULAR | Status: DC | PRN
  Administered 2016-08-06: 55 ug via INTRAVENOUS
  Administered 2016-08-06: 25 ug via INTRAVENOUS

## 2016-08-06 MED ORDER — DIPHENHYDRAMINE HCL 25 MG PO CAPS
25.0000 mg | ORAL_CAPSULE | Freq: Four times a day (QID) | ORAL | Status: DC | PRN
  Administered 2016-08-06 (×2): 25 mg via ORAL
  Filled 2016-08-06 (×2): qty 1

## 2016-08-06 MED ORDER — LACTATED RINGERS IV SOLN
INTRAVENOUS | Status: DC
  Administered 2016-08-06 (×3): via INTRAVENOUS

## 2016-08-06 MED ORDER — ONDANSETRON HCL 4 MG/2ML IJ SOLN
INTRAMUSCULAR | Status: AC
  Filled 2016-08-06: qty 2

## 2016-08-06 MED ORDER — CEFAZOLIN SODIUM-DEXTROSE 2-4 GM/100ML-% IV SOLN
2.0000 g | INTRAVENOUS | Status: AC
  Administered 2016-08-06: 2 g via INTRAVENOUS
  Filled 2016-08-06: qty 100

## 2016-08-06 MED ORDER — MEASLES, MUMPS & RUBELLA VAC ~~LOC~~ INJ: 0.5000 mL | INJECTION | Freq: Once | SUBCUTANEOUS | Status: DC

## 2016-08-06 MED ORDER — SIMETHICONE 80 MG PO CHEW: 80.0000 mg | CHEWABLE_TABLET | ORAL | Status: DC | PRN

## 2016-08-06 MED ORDER — METOCLOPRAMIDE HCL 5 MG/ML IJ SOLN
INTRAMUSCULAR | Status: AC
  Filled 2016-08-06: qty 2

## 2016-08-06 MED ORDER — FENTANYL CITRATE (PF) 100 MCG/2ML IJ SOLN
INTRAMUSCULAR | Status: AC
  Filled 2016-08-06: qty 2

## 2016-08-06 MED ORDER — PHENYLEPHRINE 8 MG IN D5W 100 ML (0.08MG/ML) PREMIX OPTIME
INJECTION | INTRAVENOUS | Status: AC
  Filled 2016-08-06: qty 100

## 2016-08-06 MED ORDER — METOCLOPRAMIDE HCL 5 MG/ML IJ SOLN
INTRAMUSCULAR | Status: DC | PRN
  Administered 2016-08-06: 10 mg via INTRAVENOUS

## 2016-08-06 MED ORDER — KETOROLAC TROMETHAMINE 30 MG/ML IJ SOLN
30.0000 mg | Freq: Once | INTRAMUSCULAR | Status: AC
  Administered 2016-08-06: 30 mg via INTRAVENOUS
  Filled 2016-08-06: qty 1

## 2016-08-06 MED ORDER — SODIUM CHLORIDE 0.9 % IV SOLN
8.0000 mg | Freq: Three times a day (TID) | INTRAVENOUS | Status: DC | PRN
  Administered 2016-08-06: 8 mg via INTRAVENOUS
  Filled 2016-08-06 (×2): qty 4

## 2016-08-06 MED ORDER — COCONUT OIL OIL: 1.0000 "application " | TOPICAL_OIL | Status: DC | PRN

## 2016-08-06 MED ORDER — ONDANSETRON HCL 4 MG/2ML IJ SOLN
INTRAMUSCULAR | Status: DC | PRN
  Administered 2016-08-06: 4 mg via INTRAVENOUS

## 2016-08-06 MED ORDER — DEXTROSE IN LACTATED RINGERS 5 % IV SOLN
INTRAVENOUS | Status: DC
  Administered 2016-08-06: 18:00:00 via INTRAVENOUS

## 2016-08-06 MED ORDER — OXYTOCIN 10 UNIT/ML IJ SOLN
INTRAMUSCULAR | Status: AC
  Filled 2016-08-06: qty 4

## 2016-08-06 MED ORDER — LACTATED RINGERS IV SOLN
INTRAVENOUS | Status: DC | PRN
  Administered 2016-08-06: 40 [IU] via INTRAVENOUS

## 2016-08-06 MED ORDER — BUPIVACAINE HCL (PF) 0.25 % IJ SOLN
INTRAMUSCULAR | Status: AC
  Filled 2016-08-06: qty 30

## 2016-08-06 MED ORDER — LIDOCAINE-EPINEPHRINE (PF) 2 %-1:200000 IJ SOLN
INTRAMUSCULAR | Status: AC
  Filled 2016-08-06: qty 20

## 2016-08-06 MED ORDER — HYDROMORPHONE HCL 1 MG/ML IJ SOLN
1.0000 mg | Freq: Once | INTRAMUSCULAR | Status: AC
  Administered 2016-08-06: 1 mg via INTRAVENOUS
  Filled 2016-08-06: qty 1

## 2016-08-06 MED ORDER — ACETAMINOPHEN 325 MG PO TABS
650.0000 mg | ORAL_TABLET | ORAL | Status: DC | PRN
Start: 2016-08-06 — End: 2016-08-08
  Administered 2016-08-06 – 2016-08-08 (×5): 650 mg via ORAL
  Filled 2016-08-06 (×5): qty 2

## 2016-08-06 MED ORDER — PHENYLEPHRINE HCL 10 MG/ML IJ SOLN
INTRAMUSCULAR | Status: DC | PRN
  Administered 2016-08-06: 40 ug via INTRAVENOUS
  Administered 2016-08-06 (×5): 80 ug via INTRAVENOUS
  Administered 2016-08-06: 120 ug via INTRAVENOUS

## 2016-08-06 MED ORDER — DIPHENHYDRAMINE HCL 50 MG/ML IJ SOLN
INTRAMUSCULAR | Status: DC | PRN
  Administered 2016-08-06: 12.5 mg via INTRAVENOUS

## 2016-08-06 MED ORDER — MORPHINE SULFATE (PF) 0.5 MG/ML IJ SOLN
INTRAMUSCULAR | Status: AC
  Filled 2016-08-06: qty 10

## 2016-08-06 MED ORDER — LACTATED RINGERS IV SOLN
INTRAVENOUS | Status: DC | PRN
  Administered 2016-08-06: 10:00:00 via INTRAVENOUS

## 2016-08-06 MED ORDER — SIMETHICONE 80 MG PO CHEW
80.0000 mg | CHEWABLE_TABLET | Freq: Three times a day (TID) | ORAL | Status: DC
  Administered 2016-08-07 – 2016-08-08 (×6): 80 mg via ORAL
  Filled 2016-08-06 (×6): qty 1

## 2016-08-06 MED ORDER — DEXAMETHASONE SODIUM PHOSPHATE 4 MG/ML IJ SOLN
INTRAMUSCULAR | Status: DC | PRN
  Administered 2016-08-06: 4 mg via INTRAVENOUS

## 2016-08-06 MED ORDER — OXYTOCIN 40 UNITS IN LACTATED RINGERS INFUSION - SIMPLE MED: 2.5000 [IU]/h | INTRAVENOUS | Status: AC

## 2016-08-06 MED ORDER — LIDOCAINE-EPINEPHRINE (PF) 2 %-1:200000 IJ SOLN
INTRAMUSCULAR | Status: DC | PRN
  Administered 2016-08-06: 5 mL via INTRADERMAL
  Administered 2016-08-06: 4 mL via INTRADERMAL
  Administered 2016-08-06: 5 mL via INTRADERMAL

## 2016-08-06 MED ORDER — SODIUM CHLORIDE 0.9 % IR SOLN
Status: DC | PRN
  Administered 2016-08-06: 1000 mL

## 2016-08-06 MED ORDER — DIBUCAINE 1 % RE OINT: 1.0000 "application " | TOPICAL_OINTMENT | RECTAL | Status: DC | PRN

## 2016-08-06 MED ORDER — MORPHINE SULFATE (PF) 0.5 MG/ML IJ SOLN
INTRAMUSCULAR | Status: DC | PRN
  Administered 2016-08-06: 4 mg via EPIDURAL

## 2016-08-06 MED ORDER — BUPIVACAINE IN DEXTROSE 0.75-8.25 % IT SOLN
INTRATHECAL | Status: AC
  Filled 2016-08-06: qty 2

## 2016-08-06 MED ORDER — MEPERIDINE HCL 25 MG/ML IJ SOLN: 6.2500 mg | INTRAMUSCULAR | Status: DC | PRN

## 2016-08-06 MED ORDER — FENTANYL CITRATE (PF) 100 MCG/2ML IJ SOLN
25.0000 ug | INTRAMUSCULAR | Status: DC | PRN
  Administered 2016-08-06: 50 ug via INTRAVENOUS
  Administered 2016-08-06: 25 ug via INTRAVENOUS

## 2016-08-06 MED ORDER — IBUPROFEN 600 MG PO TABS
600.0000 mg | ORAL_TABLET | Freq: Four times a day (QID) | ORAL | Status: DC
  Administered 2016-08-07 – 2016-08-08 (×3): 600 mg via ORAL
  Filled 2016-08-06 (×3): qty 1

## 2016-08-06 MED ORDER — TETANUS-DIPHTH-ACELL PERTUSSIS 5-2.5-18.5 LF-MCG/0.5 IM SUSP: 0.5000 mL | Freq: Once | INTRAMUSCULAR | Status: DC

## 2016-08-06 MED ORDER — SCOPOLAMINE 1 MG/3DAYS TD PT72
1.0000 | MEDICATED_PATCH | TRANSDERMAL | Status: DC
  Administered 2016-08-06: 1.5 mg via TRANSDERMAL
  Filled 2016-08-06: qty 1

## 2016-08-06 MED ORDER — SENNOSIDES-DOCUSATE SODIUM 8.6-50 MG PO TABS
2.0000 | ORAL_TABLET | ORAL | Status: DC
  Administered 2016-08-06 – 2016-08-08 (×2): 2 via ORAL
  Filled 2016-08-06 (×2): qty 2

## 2016-08-06 MED ORDER — PRENATAL MULTIVITAMIN CH
1.0000 | ORAL_TABLET | Freq: Every day | ORAL | Status: DC
  Administered 2016-08-07: 1 via ORAL
  Filled 2016-08-06: qty 1

## 2016-08-06 MED ORDER — SIMETHICONE 80 MG PO CHEW
80.0000 mg | CHEWABLE_TABLET | ORAL | Status: DC
  Administered 2016-08-06 – 2016-08-08 (×2): 80 mg via ORAL
  Filled 2016-08-06 (×2): qty 1

## 2016-08-06 MED ORDER — BUPIVACAINE HCL (PF) 0.25 % IJ SOLN
INTRAMUSCULAR | Status: DC | PRN
  Administered 2016-08-06: 30 mL

## 2016-08-06 MED ORDER — METOCLOPRAMIDE HCL 5 MG/ML IJ SOLN: 10.0000 mg | Freq: Once | INTRAMUSCULAR | Status: DC | PRN

## 2016-08-06 MED ORDER — MENTHOL 3 MG MT LOZG: 1.0000 | LOZENGE | OROMUCOSAL | Status: DC | PRN

## 2016-08-06 MED ORDER — SOD CITRATE-CITRIC ACID 500-334 MG/5ML PO SOLN
30.0000 mL | Freq: Once | ORAL | Status: AC
  Administered 2016-08-06: 30 mL via ORAL
  Filled 2016-08-06: qty 15

## 2016-08-06 MED ORDER — HYDROMORPHONE HCL 2 MG PO TABS
2.0000 mg | ORAL_TABLET | ORAL | Status: DC | PRN
  Administered 2016-08-06 – 2016-08-08 (×7): 2 mg via ORAL
  Filled 2016-08-06 (×7): qty 1

## 2016-08-06 MED ORDER — ZOLPIDEM TARTRATE 5 MG PO TABS
5.0000 mg | ORAL_TABLET | Freq: Every evening | ORAL | Status: DC | PRN
Start: 2016-08-06 — End: 2016-08-08

## 2016-08-06 MED ORDER — DIPHENHYDRAMINE HCL 50 MG/ML IJ SOLN
INTRAMUSCULAR | Status: AC
  Filled 2016-08-06: qty 1

## 2016-08-06 MED ORDER — WITCH HAZEL-GLYCERIN EX PADS: 1.0000 "application " | MEDICATED_PAD | CUTANEOUS | Status: DC | PRN

## 2016-08-06 SURGICAL SUPPLY — 35 items
APL SKNCLS STERI-STRIP NONHPOA (GAUZE/BANDAGES/DRESSINGS) ×1
BENZOIN TINCTURE PRP APPL 2/3 (GAUZE/BANDAGES/DRESSINGS) ×3 IMPLANT
CHLORAPREP W/TINT 26ML (MISCELLANEOUS) ×3 IMPLANT
CLAMP CORD UMBIL (MISCELLANEOUS) IMPLANT
CLIP FILSHIE TUBAL LIGA STRL (Clip) ×2 IMPLANT
CLOSURE WOUND 1/2 X4 (GAUZE/BANDAGES/DRESSINGS) ×1
CLOTH BEACON ORANGE TIMEOUT ST (SAFETY) ×3 IMPLANT
DRSG OPSITE POSTOP 4X10 (GAUZE/BANDAGES/DRESSINGS) ×3 IMPLANT
ELECT REM PT RETURN 9FT ADLT (ELECTROSURGICAL) ×3
ELECTRODE REM PT RTRN 9FT ADLT (ELECTROSURGICAL) ×1 IMPLANT
EXTRACTOR VACUUM M CUP 4 TUBE (SUCTIONS) IMPLANT
EXTRACTOR VACUUM M CUP 4' TUBE (SUCTIONS)
GLOVE BIOGEL PI IND STRL 7.0 (GLOVE) ×3 IMPLANT
GLOVE BIOGEL PI INDICATOR 7.0 (GLOVE) ×6
GLOVE ECLIPSE 7.0 STRL STRAW (GLOVE) ×3 IMPLANT
GOWN STRL REUS W/TWL LRG LVL3 (GOWN DISPOSABLE) ×9 IMPLANT
KIT ABG SYR 3ML LUER SLIP (SYRINGE) IMPLANT
NDL HYPO 25X5/8 SAFETYGLIDE (NEEDLE) IMPLANT
NEEDLE HYPO 22GX1.5 SAFETY (NEEDLE) ×3 IMPLANT
NEEDLE HYPO 25X5/8 SAFETYGLIDE (NEEDLE) IMPLANT
NS IRRIG 1000ML POUR BTL (IV SOLUTION) ×3 IMPLANT
PACK C SECTION WH (CUSTOM PROCEDURE TRAY) ×3 IMPLANT
PAD ABD 7.5X8 STRL (GAUZE/BANDAGES/DRESSINGS) ×3 IMPLANT
PAD OB MATERNITY 4.3X12.25 (PERSONAL CARE ITEMS) ×3 IMPLANT
PENCIL SMOKE EVAC W/HOLSTER (ELECTROSURGICAL) ×3 IMPLANT
RTRCTR C-SECT PINK 25CM LRG (MISCELLANEOUS) IMPLANT
SPONGE GAUZE 4X4 12PLY (GAUZE/BANDAGES/DRESSINGS) ×4 IMPLANT
SPONGE GAUZE 4X4 12PLY STER LF (GAUZE/BANDAGES/DRESSINGS) ×3 IMPLANT
STRIP CLOSURE SKIN 1/2X4 (GAUZE/BANDAGES/DRESSINGS) ×2 IMPLANT
SUT VIC AB 0 CTX 36 (SUTURE) ×9
SUT VIC AB 0 CTX36XBRD ANBCTRL (SUTURE) ×3 IMPLANT
SUT VIC AB 4-0 KS 27 (SUTURE) ×3 IMPLANT
SYR 30ML LL (SYRINGE) ×3 IMPLANT
TOWEL OR 17X24 6PK STRL BLUE (TOWEL DISPOSABLE) ×3 IMPLANT
TRAY FOLEY BAG SILVER LF 14FR (SET/KITS/TRAYS/PACK) ×3 IMPLANT

## 2016-08-06 NOTE — Progress Notes (Signed)
FHR obtained via doppler ranged from 113-119. Mother's pulse confirmed between 77-82. Corinda Ammon [place], [place] 08/06/2016 8:02 AM

## 2016-08-06 NOTE — Transfer of Care (Signed)
Immediate Anesthesia Transfer of Care Note  Patient: Renee Suarez  Procedure(s) Performed: Procedure(s): CESAREAN SECTION WITH BILATERAL TUBAL LIGATION (Bilateral)  Patient Location: PACU  Anesthesia Type:Epidural  Level of Consciousness: awake, alert  and oriented  Airway & Oxygen Therapy: Patient Spontanous Breathing  Post-op Assessment: Report given to RN and Post -op Vital signs reviewed and stable  Post vital signs: Reviewed and stable  Last Vitals:  Vitals:   08/06/16 0758  BP: 110/75  Pulse: 82  Resp: 18  Temp: 36.5 C    Last Pain:  Vitals:   08/06/16 0758  TempSrc: Oral         Complications: No apparent anesthesia complications

## 2016-08-06 NOTE — Lactation Note (Signed)
This note was copied from a baby's chart. Lactation Consultation Note  Patient Name: Renee Suarez [id number] Date: 08/06/2016 Reason for consult: Initial assessment   Initial consult with Exp BF mom of 1 hour old infant. Infant was latched to right breast when LC entered room and had been feeding for 20 minutes. Assisted mom with unlatching infant and latching to left breast. When infant unlatched, nipple was noted to be semi compressed. Mom with large compressible breasts and areola with very large everted nipples, large gtts colostrum easily expressible. Mom reports + breast changes with pregnancy and nipple size enlarged greatly with pregnancy. Mom did well with assisting with latch and ensuring infant had breathing space through positioning after enc mom not to pull back on breast while in infant mouth. Discussed importance of ensuring wide open mouth prior to latch and ensuring infant gets entire nipple in mouth. Infant latched to left breast in the laid back cross cradle hold. Infant with rhythmic suckles and frequent intermittent swallows, when infant came off the breast briefly, nipple was noted to be rounded. Enc mom to feed infant STS 8-12 x in 24 hours at first feeding cues. Enc mom to massage/compress breast with feedings. Feeding log given with instructions for use .   Mom reports she pumped and bottle fed her 28 yo, who was born 3 months early and in NICU, infant did not latch. She BF her 66 yo for 13 months, she reports she gave very little formula. She plans to BF this infant for a year. She is only planning to give formula if she needs to be away for reasons such as work.   Showed mom how to hand express and Enc mom to hand express prior to each feeding and after feeding to stimulate milk production. Enc mom to apply to nipples post BF to assist with nipple tenderness. Bf basics, positioning, pillow and head support, colostrum, and milk coming to volume reviewed. Enc mom to call out for  feeding assistance as needed.   BF Resources Handout and LC Brochure given, mom informed of IP/OP Services, BF Support Groups and LC phone #. Mom is a [facility] client and is aware to call out for feeding assistance as needed. Mom reports she has ordered an electric pump but is unsure of what kind. She is requesting a manual pump before d/c. Mom without further questions/concerns at this time.      Maternal Data Formula Feeding for Exclusion: Yes Has patient been taught Hand Expression?: Yes Does the patient have breastfeeding experience prior to this delivery?: Yes  Feeding Feeding Type: Breast Fed Length of feed: 25 min  LATCH Score/Interventions Latch: Grasps breast easily, tongue down, lips flanged, rhythmical sucking.  Audible Swallowing: Spontaneous and intermittent  Type of Nipple: Everted at rest and after stimulation  Comfort (Breast/Nipple): Soft / non-tender     Hold (Positioning): Assistance needed to correctly position infant at breast and maintain latch.  LATCH Score: 9  Lactation Tools Discussed/Used WIC Program: Yes   Consult Status Consult Status: Follow-up Date: 08/07/16 Follow-up type: In-patient    Silas Flood Hice 08/06/2016, 11:42 AM

## 2016-08-06 NOTE — Plan of Care (Signed)
Problem: Pain Managment: Goal: General experience of comfort will improve Outcome: Progressing Pt. Complaining of pain soon after arrival to unit of a 7/10; MD notified and order received for 1 mg Dilaudid IV.

## 2016-08-06 NOTE — Interval H&P Note (Signed)
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 After consideration of risks, benefits and other options for treatment, the patient has consented to  Procedure(s): CESAREAN SECTION WITH BILATERAL TUBAL LIGATION (Bilateral) as a surgical intervention .  The patient's history has been reviewed, patient examined, no change in status, stable for surgery.  I have reviewed the patient's chart and labs.  Questions were answered to the patient's satisfaction.     Reva Bores

## 2016-08-06 NOTE — Anesthesia Preprocedure Evaluation (Signed)
Anesthesia Evaluation  Patient identified by MRN, date of birth, ID band Patient awake    Reviewed: Allergy & Precautions, NPO status , Patient's Chart, lab work & pertinent test results  Airway Mallampati: II  TM Distance: >3 FB Neck ROM: Full    Dental no notable dental hx. (+) Teeth Intact   Pulmonary former smoker,    Pulmonary exam normal breath sounds clear to auscultation       Cardiovascular hypertension, Normal cardiovascular exam Rhythm:Regular Rate:Normal     Neuro/Psych  Headaches, negative psych ROS   GI/Hepatic Neg liver ROS, GERD  Medicated and Controlled,  Endo/Other  negative endocrine ROS  Renal/GU Renal diseaseHx/o UPJ obstruction  negative genitourinary   Musculoskeletal   Abdominal (+) + obese,   Peds  Hematology  (+) Sickle cell trait and anemia ,   Anesthesia Other Findings   Reproductive/Obstetrics (+) Pregnancy Previous C/Section x 2 Hx/o Pre eclampsia with 1st pregnancy                             Anesthesia Physical Anesthesia Plan  ASA: II  Anesthesia Plan: Epidural   Post-op Pain Management:    Induction:   Airway Management Planned: Natural Airway  Additional Equipment:   Intra-op Plan:   Post-operative Plan:   Informed Consent: I have reviewed the patients History and Physical, chart, labs and discussed the procedure including the risks, benefits and alternatives for the proposed anesthesia with the patient or authorized representative who has indicated his/her understanding and acceptance.   Dental advisory given  Plan Discussed with: Anesthesiologist  Anesthesia Plan Comments:         Anesthesia Quick Evaluation

## 2016-08-06 NOTE — Progress Notes (Signed)
UR chart review completed.  

## 2016-08-06 NOTE — Lactation Note (Signed)
This note was copied from a baby's chart. Lactation Consultation Note  Patient Name: Renee Suarez [id number] Date: 08/06/2016 Reason for consult: Initial assessment   Manual pump taken to mom's room with # 27 flange. Discussed with mom that we would want to try this pump before she leaves to see if she needs a larger flange.   Stephanie Acre reported she noticed infant has a short lingual frenulum. Dr. Margo Aye was in the room examining infant, informed her possibility of Lingual Frenulum. Dr. Margo Aye to examine.    Maternal Data Formula Feeding for Exclusion: Yes Has patient been taught Hand Expression?: Yes Does the patient have breastfeeding experience prior to this delivery?: Yes  Feeding Feeding Type: Breast Fed Length of feed: 25 min  LATCH Score/Interventions Latch: Grasps breast easily, tongue down, lips flanged, rhythmical sucking.  Audible Swallowing: Spontaneous and intermittent  Type of Nipple: Everted at rest and after stimulation  Comfort (Breast/Nipple): Soft / non-tender     Hold (Positioning): Assistance needed to correctly position infant at breast and maintain latch.  LATCH Score: 9  Lactation Tools Discussed/Used WIC Program: Yes   Consult Status Consult Status: Follow-up Date: 08/07/16 Follow-up type: In-patient    Silas Flood Toshia Larkin 08/06/2016, 12:13 PM

## 2016-08-06 NOTE — Anesthesia Postprocedure Evaluation (Signed)
Anesthesia Post Note  Patient: Renee Suarez  Procedure(s) Performed: Procedure(s) (LRB): CESAREAN SECTION WITH BILATERAL TUBAL LIGATION (Bilateral)  Patient location during evaluation: Mother Baby Anesthesia Type: Epidural Level of consciousness: awake and alert Pain management: satisfactory to patient Vital Signs Assessment: post-procedure vital signs reviewed and stable Respiratory status: respiratory function stable Cardiovascular status: stable Postop Assessment: no headache, no backache, epidural receding, patient able to bend at knees, no signs of nausea or vomiting and adequate PO intake Anesthetic complications: no        Last Vitals:  Vitals:   08/06/16 1145 08/06/16 1214  BP: 97/60 (!) 118/59  Pulse: 62 67  Resp: (!) 22 18  Temp:      Last Pain:  Vitals:   08/06/16 1240  TempSrc:   PainSc: 7    Pain Goal:                 Izabell Schalk

## 2016-08-06 NOTE — Anesthesia Procedure Notes (Signed)
Epidural Patient location during procedure: OR Start time: 08/06/2016 9:24 AM  Staffing Anesthesiologist: Mal Amabile Performed: anesthesiologist   Preanesthetic Checklist Completed: patient identified, site marked, surgical consent, pre-op evaluation, timeout performed, IV checked, risks and benefits discussed and monitors and equipment checked  Epidural Patient position: sitting Prep: site prepped and draped and DuraPrep Patient monitoring: continuous pulse ox and blood pressure Approach: midline Location: L4-L5 Injection technique: LOR air  Needle:  Needle type: Tuohy  Needle gauge: 17 G Needle length: 9 cm and 9 Needle insertion depth: 6 cm Catheter type: closed end flexible Catheter size: 19 Gauge Catheter at skin depth: 11 cm Test dose: negative and Other  Assessment Sensory level: T4 Events: blood not aspirated, injection not painful, no injection resistance, negative IV test and no paresthesia  Additional Notes Patient tolerated procedure well. Adequate sensory level.

## 2016-08-06 NOTE — Op Note (Signed)
Preoperative Diagnosis:  IUP @ [redacted]w[redacted]d, Previous C-section x 2, undesired fertility  Postoperative Diagnosis:  Same  Procedure: Repeat low transverse cesarean section, Bilateral Tubal Ligation with Filshie clips  Surgeon: Tinnie Gens, M.D.  Assistant: Carolanne Grumbling, RNFA  Anesthesia: epidural with Mal Amabile, MD  Findings: Viable female infant, APGAR (1 MIN): 8   APGAR (5 MINS): 9   Normal tubes and ovaries  Estimated blood loss: 1000 cc  Complications: None known  Specimens: Placenta to labor and delivery  Reason for procedure: Briefly, the patient is a 26 y.o. [id number] at [redacted]w[redacted]d with h/o previous C-section who desires permanent sterility.  Patient counseled, r.e. Risks benefits of BTL, including permanency of procedure, risk of failure(1:100), increased risk of ectopic.  Patient verbalized understanding and desires to proceed   Procedure: Patient is a to the OR where epidural analgesia was administered. She was then placed in a supine position with left lateral tilt. She received 2 g of Ancef and SCDs were in place. A Foley catheter was placed in the bladder. She was prepped and draped in the usual sterile fashion. A timeout was performed. A knife was then used to make a Pfannenstiel incision. This incision was carried out to underlying fascia which was divided in the midline with the electrocautery. The incision was extended laterally, sharply. The fascia was dissected of the underlying rectus superiorly.  The rectus was divided in the midline.  The peritoneal cavity was entered sharply. Adhesions of bladder noted high on the uterus and a bladder flap created to accommodate the Alexis.  Alexis retractor was placed inside the incision.  A knife was used to make a low transverse incision on the uterus. This incision was carried down to the amniotic cavity was entered. Fluid collected. Fetus was in LOA position and was brought up out of the incision without difficulty. Cord was clamped x 2 and  cut. Infant taken to waiting pediatrician. Cord blood was obtained. Placenta was delivered from the uterus.  Uterus was cleaned with dry lap pads. Uterine incision closed with 0 Vicryl suture in a locked running fashion. Attention was turned to the pt's left tube which was grasped with a Babcock clamp and followed to its fimbriated end.  A Filshie clip was placed across the tube 1.5 cm from the cornu.  Attention was turned to the pt's right tube which was grasped with a Babcock clamp and followed to its fimbriated end.  A Filshie clip was placed across the tube 1.5 cm from the cornu. A 1cc of 0.25% Marcaine was injected into the surrounding tubes bilaterally. Small amount of bleeding noted from the left apex of the incision and re-enforced with 1 figure of eight.  Alexis retractor was removed from the abdomen. Peritoneal closure was done with 0 Vicryl suture. Fascia is closed with 0 Vicryl suture in a running fashion. Subcutaneous tissue infused with 30cc 0.25% Marcaine. Skin closed using 3-0 Vicryl on a Keith needle.  Steri strips applied, followed by pressure dressing.  All instrument, needle and lap counts were correct x 2.  Patient was awake and taken to PACU stable.  Infant with mom in couplet care, stable.

## 2016-08-06 NOTE — Anesthesia Postprocedure Evaluation (Signed)
Anesthesia Post Note  Patient: Renee Suarez  Procedure(s) Performed: Procedure(s) (LRB): CESAREAN SECTION WITH BILATERAL TUBAL LIGATION (Bilateral)  Patient location during evaluation: PACU Anesthesia Type: Epidural Level of consciousness: awake and alert and oriented Pain management: pain level controlled Vital Signs Assessment: post-procedure vital signs reviewed and stable Respiratory status: spontaneous breathing, nonlabored ventilation and respiratory function stable Cardiovascular status: stable and blood pressure returned to baseline Postop Assessment: no headache, no backache, epidural receding, patient able to bend at knees and no signs of nausea or vomiting Anesthetic complications: no        Last Vitals:  Vitals:   08/06/16 1130 08/06/16 1145  BP: 100/62   Pulse: 60 62  Resp: 18 (!) 22  Temp: 36.4 C     Last Pain:  Vitals:   08/06/16 1145  TempSrc:   PainSc: 2    Pain Goal:                 Mikal Blasdell A.

## 2016-08-07 ENCOUNTER — Encounter (HOSPITAL_COMMUNITY): Payer: Self-pay | Admitting: Family Medicine

## 2016-08-07 LAB — BIRTH TISSUE RECOVERY COLLECTION (PLACENTA DONATION)

## 2016-08-07 LAB — CBC
HCT: 28.1 % — ABNORMAL LOW (ref 36.0–46.0)
Hemoglobin: 9.6 g/dL — ABNORMAL LOW (ref 12.0–15.0)
MCH: 26.4 pg (ref 26.0–34.0)
MCHC: 34.2 g/dL (ref 30.0–36.0)
MCV: 77.4 fL — AB (ref 78.0–100.0)
PLATELETS: 96 10*3/uL — AB (ref 150–400)
RBC: 3.63 MIL/uL — ABNORMAL LOW (ref 3.87–5.11)
RDW: 13.4 % (ref 11.5–15.5)
WBC: 9.1 10*3/uL (ref 4.0–10.5)

## 2016-08-07 MED ORDER — FERROUS SULFATE 325 (65 FE) MG PO TABS
325.0000 mg | ORAL_TABLET | Freq: Two times a day (BID) | ORAL | Status: DC
  Administered 2016-08-07 – 2016-08-08 (×3): 325 mg via ORAL
  Filled 2016-08-07 (×3): qty 1

## 2016-08-07 NOTE — Progress Notes (Signed)
Spoke with Dr. Redmond Baseman regarding pt's platelet count of 96,000 and motrin order. Per MD, pt may have tylenol instead.

## 2016-08-07 NOTE — Progress Notes (Signed)
Subjective: Postpartum Day #1: Cesarean Delivery Patient reports incisional pain, tolerating PO and no problems voiding.    Objective: Vital signs in last 24 hours: Temp:  [97.4 F (36.3 C)-99 F (37.2 C)] 98.4 F (36.9 C) (04/12 0521) Pulse Rate:  [60-86] 67 (04/12 0521) Resp:  [15-22] 20 (04/11 2129) BP: (97-118)/(54-81) 105/57 (04/12 0521) SpO2:  [95 %-100 %] 98 % (04/12 0521) Weight:  [185 lb (83.9 kg)] 185 lb (83.9 kg) (04/11 0758)  Physical Exam:  General: alert, cooperative and no distress Lochia: appropriate Uterine Fundus: firm Incision: no significant drainage, no dehiscence, no significant erythema DVT Evaluation: No evidence of DVT seen on physical exam. No cords or calf tenderness. No significant calf/ankle edema.   Recent Labs  08/06/16 0810 08/07/16 0540  HGB 11.0* 9.6*  HCT 32.9* 28.1*    Assessment/Plan: Status post Cesarean section. Doing well postoperatively.  Continue current care.  Plan for discharge home 08/08/16.    Roe Coombs, CNM 08/07/2016, 7:11 AM

## 2016-08-07 NOTE — Lactation Note (Signed)
This note was copied from a baby's chart. Lactation Consultation Note  Patient Name: Renee Suarez [id number] Date: 08/07/2016 Reason for consult: Follow-up assessment  Baby 28 hours old. Mom reports that she has just finished nursing and she is hearing lots of swallows while baby at the breast. Mom states that she was able to hand express with lots of colostrum flowing. Enc mom to continue offering lots of STS and nurse with cues. Mom denies any breast pain and reports that the baby able to maintain a deep latch at the breast.   Maternal Data    Feeding Feeding Type: Breast Fed Length of feed: 20 min  LATCH Score/Interventions                      Lactation Tools Discussed/Used     Consult Status Consult Status: Follow-up Date: 08/08/16 Follow-up type: In-patient    Sherlyn Hay 08/07/2016, 2:40 PM

## 2016-08-08 MED ORDER — FERROUS SULFATE 325 (65 FE) MG PO TABS: 325.0000 mg | ORAL_TABLET | Freq: Two times a day (BID) | ORAL | 3 refills | Status: DC

## 2016-08-08 MED ORDER — HYDROMORPHONE HCL 2 MG PO TABS: 2.0000 mg | ORAL_TABLET | ORAL | 0 refills | Status: DC | PRN

## 2016-08-08 NOTE — Lactation Note (Signed)
This note was copied from a baby's chart. Lactation Consultation Note  Patient Name: Renee Suarez Date: 08/08/2016 Reason for consult: Follow-up assessment;Infant weight loss (6% weight loss )  Baby is 109 hours old and mom has been breastfeeding and bottle feeding EBM she pumped with a hand pump, and some formula ( mom choice )  Per mom milk is in , but not engorged.  Baby latched on the right breast / football/ when LC walked in and had been feeding 10 mins, multiple swallows noted and depth. Baby ended up feeding 20 mins, baby ended feeding and nipple well rounded. Per mom the latch was comfortable.  LC stressed since moms milk is in to give the baby practice at the breast so the baby learns mom well, and also to prevent engorgement.  Mom denies soreness. Sore nipple and engorgement prevention and tx reviewed.  Mom will have a hand pump for D/C which she has been using already.  LC discussed nutritive vs non - nutritive feeding patterns, and the importance of feeding STS until the baby can stay awake for a feeding. Also to watch for hanging out at he breast.  Since this is moms 3rd  Baby - explained to mom her volume can be greater and her let down quicker. So if her breast are to full to start, hand express off fullness or use hand pump , so baby can get a deep latch.  Mother informed of post-discharge support and given phone number to the lactation department, including services for phone call assistance; out-patient appointments; and breastfeeding support group. List of other breastfeeding resources in the community given in the handout. Encouraged mother to call for problems or concerns related to breastfeeding.   Maternal Data    Feeding Feeding Type:  (baby latched ) Nipple Type:  (multiple swallows ) Length of feed: 20 min  LATCH Score/Interventions Latch: Grasps breast easily, tongue down, lips flanged, rhythmical sucking.     Type of Nipple: Everted at rest and  after stimulation  Comfort (Breast/Nipple): Soft / non-tender     Hold (Positioning): No assistance needed to correctly position infant at breast. Intervention(s): Breastfeeding basics reviewed     Lactation Tools Discussed/Used Tools: Pump Breast pump type: Manual   Consult Status Consult Status: Complete Date: 08/08/16    Renee Suarez 08/08/2016, 1:07 PM

## 2016-08-08 NOTE — Clinical Social Work Maternal (Signed)
  CLINICAL SOCIAL WORK MATERNAL/CHILD NOTE  Patient Details  Name: Renee Suarez MRN: [id number] Date of Birth: [birth year]  Date:  08/08/2016  Clinical Social Worker Initiating Note:  Terri Piedra, [place] Date/ Time Initiated:  08/08/16/0900     Child's Name:  Renee Suarez.   Legal Guardian:  Other (Comment) (Parents: [name] and Renee Pierson)   Need for Interpreter:  None   Date of Referral:  08/07/16     Reason for Referral:  Current Substance Use/Substance Use During Pregnancy    Referral Source:  [facility]   Address:  [address], [place], [place] [address]  Phone number:  [phone]   Household Members:  Minor Children, Significant Other (Couple has two daughters at home: [name] (50) and [name] (5))   Natural Supports (not living in the home):  Friends, Immediate Family, Extended Family   Professional Supports:     Employment:     Type of Work: MOB works for [employer].  FOB does dye casting.   Education:      Museum/gallery curator Resources:  Medicaid   Other Resources:      Cultural/Religious Considerations Which May Impact Care: None stated. MOB's facesheet notes religion as [place].  Strengths:  Ability to meet basic needs , Pediatrician chosen , Home prepared for child  Conservation officer, nature)   Risk Factors/Current Problems:  Substance Use  (hx marijuana)   Cognitive State:  Alert , Able to Concentrate , Linear Thinking , Insightful    Mood/Affect:  Calm , Comfortable , Interested , Euthymic    CSW Assessment: CSW met with MOB in her first floor room/133 to offer support and complete assessment due to hx of marijuana use.  MOB had a positive screen for marijuana in September 2017.  MOB was pleasant and welcoming of CSW's visit.  CSW found her easy to engage. MOB reports she and baby are doing well and are preparing for discharge today.  She reports that she has a good support system and that she and FOB are in a relationship.  She states he is  involved and supportive and the father of all of her children.  She reports she has everything she needs for baby at home and is aware of SIDS precautions as reviewed by CSW. MOB denies any hx of mental illness including PMADs after her first two children.  CSW provided education regarding PMADs and gave support resources should she identify emotional concerns at any time.   CSW inquired about MOB's marijuana use noted in her record and she states she smoked marijuana every day prior to pregnancy and typically just in the mornings during the first 6 months of her pregnancy because she was so sick.  She states her pregnancy was "not good at all" and that she "threw up every day."  She reports that this is the sickest she has been of all her pregnancies.  She states she smoked smoking at 6 months pregnant, but added that she never smokes around her children.  She was understanding of hospital drug screen policy and mandated reporting to CPS.  MOB denies CPS hx.  Baby's UDS is negative and CDS is pending.  CSW will monitor CDS results and make report accordingly.  CSW Plan/Description:  Information/Referral to [employer] , No Further Intervention Required/No Barriers to Discharge, Patient/Family Education     Alphonzo Cruise, [place] 08/08/2016, 10:27 AM

## 2016-08-08 NOTE — Lactation Note (Signed)
This note was copied from a baby's chart. Lactation Consultation Note Mom states baby is cluster feeding. Baby has just fell asleep from the breast before LC came to rm. Noted formula on bed side table, asked mom if she is giving formula. Mom stated she hadn't yet. Mom stated she pumped and gave around 30 ml formula to baby. Mentioned to mom if pumped that much colostrum, baby didn't need formula. Discussed engorgement. Mom asked what that was, explained. Mom  Patient Name: Boy Yoshika Vensel [id number] Date: 08/08/2016 Reason for consult: Initial assessment   Maternal Data    Feeding Feeding Type: Breast Milk Nipple Type: Slow - flow Length of feed: 30 min  LATCH Score/Interventions       Type of Nipple: Everted at rest and after stimulation  Comfort (Breast/Nipple): Filling, red/small blisters or bruises, mild/mod discomfort     Hold (Positioning): Full assist, staff holds infant at breast     Lactation Tools Discussed/Used Tools: Pump Breast pump type: Double-Electric Breast Pump Initiated by:: RN Date initiated:: 08/07/16   Consult Status Date: 08/08/16 Follow-up type: In-patient    Katelyn Kohlmeyer, [place] 08/08/2016, 2:20 AM

## 2016-08-08 NOTE — Discharge Summary (Signed)
OB Discharge Summary  Patient Name: EADIE REPETTO DOB: 1990/09/09 MRN: [id number]  Date of admission: 08/06/2016 Delivering MD: Reva Bores   Date of discharge: 08/08/2016  Admitting diagnosis: RCS [phone] Undesired Fertility [address]11 Intrauterine pregnancy: [redacted]w[redacted]d     Secondary diagnosis:Principal Problem:   History of cesarean delivery, antepartum Active Problems:   Postpartum care following cesarean delivery  Additional problems: platelets consistently at 96K during hospital stay, were 163K last year     Discharge diagnosis: Term Pregnancy Delivered                                                                     Post partum procedures:postpartum tubal ligation   Complications: None  Hospital course:  Sceduled C/S   26 y.o. yo [id number] at [redacted]w[redacted]d was admitted to the hospital 08/06/2016 for scheduled cesarean section with the following indication:Elective Repeat.  Membrane Rupture Time/Date: 9:55 AM ,08/06/2016   Patient delivered a Viable infant.08/06/2016  Details of operation can be found in separate operative note.  Pateint had an uncomplicated postpartum course.  She is ambulating, tolerating a regular diet, passing flatus, and urinating well. Patient is discharged home in stable condition on  08/08/16         Physical exam  Vitals:   08/07/16 1119 08/07/16 1823 08/08/16 0034 08/08/16 0700  BP:  (!) 107/55 (!) 115/56 107/68  Pulse:  80 72 74  Resp: Temp:  98.1 F (36.7 C) 98.3 F (36.8 C) 98.8 F (37.1 C)  TempSrc:  Oral  Oral  SpO2:  100%    Weight:      Height:       General: alert Lochia: appropriate Uterine Fundus: firm and appropriately tender at U-1 Incision: Dressing is clean, dry, and intact DVT Evaluation: No evidence of DVT seen on physical exam. Labs: Lab Results  Component Value Date   WBC 9.1 08/07/2016   HGB 9.6 (L) 08/07/2016   HCT 28.1 (L) 08/07/2016   MCV 77.4 (L) 08/07/2016   PLT 96 (L) 08/07/2016   CMP Latest Ref  Rng & Units 01/22/2016  Glucose 65 - 99 mg/dL 73  BUN 6 - 20 mg/dL 6  Creatinine 9.56 - 2.13 mg/dL 0.86(V)  Sodium 784 - 696 mmol/L 136  Potassium 3.5 - 5.2 mmol/L 3.7  Chloride 96 - 106 mmol/L 99  CO2 18 - 29 mmol/L 22  Calcium 8.7 - 10.2 mg/dL 9.5  Total Protein 6.0 - 8.5 g/dL 7.2  Total Bilirubin 0.0 - 1.2 mg/dL 0.7  Alkaline Phos 39 - 117 IU/L 36(L)  AST 0 - 40 IU/L 11  ALT 0 - 32 IU/L 17    Discharge instruction: per After Visit Summary and "Baby and Me Booklet".  After Visit Meds:  Allergies as of 08/08/2016      Reactions   Percocet [oxycodone-acetaminophen] Hives      Medication List    TAKE these medications   acetaminophen 325 MG tablet Commonly known as:  TYLENOL Take 650 mg by mouth every 6 (six) hours as needed for mild pain, moderate pain or headache.   diphenhydrAMINE 25 mg capsule Commonly known as:  BENADRYL Take 50 mg by mouth daily as needed for  allergies.   ferrous sulfate 325 (65 FE) MG tablet Take 1 tablet (325 mg total) by mouth 2 (two) times daily with a meal.   HYDROmorphone 2 MG tablet Commonly known as:  DILAUDID Take 1 tablet (2 mg total) by mouth every 3 (three) hours as needed for moderate pain or severe pain.   PREPLUS 27-1 MG Tabs Take 1 tablet by mouth daily.   zolpidem 10 MG tablet Commonly known as:  AMBIEN Take 1 tablet (10 mg total) by mouth at bedtime as needed for sleep.       Diet: routine diet  Activity: Advance as tolerated. Pelvic rest for 6 weeks.   Outpatient follow up:6 weeks Follow up Appt:No future appointments. Follow up visit: No Follow-up on file.  Postpartum contraception: Tubal Ligation  Newborn Data: Live born female  Birth Weight: 7 lb 0.5 oz (3190 g) APGAR: 8, 9  Baby Feeding: Breast Disposition:home with mother   08/08/2016 Allie Bossier, MD

## 2016-08-08 NOTE — Discharge Instructions (Signed)

## 2016-08-10 ENCOUNTER — Encounter (HOSPITAL_COMMUNITY): Payer: Self-pay

## 2016-08-10 ENCOUNTER — Inpatient Hospital Stay (HOSPITAL_COMMUNITY)
Admission: AD | Admit: 2016-08-10 | Discharge: 2016-08-10 | Disposition: A | Discharge status: Home / Self Care | Payer: Medicaid Other | Source: Ambulatory Visit | Attending: Family Medicine | Admitting: Family Medicine

## 2016-08-10 DIAGNOSIS — Z833 Family history of diabetes mellitus: Secondary | ICD-10-CM | POA: Insufficient documentation

## 2016-08-10 DIAGNOSIS — K5900 Constipation, unspecified: Secondary | ICD-10-CM | POA: Insufficient documentation

## 2016-08-10 DIAGNOSIS — T7840XS Allergy, unspecified, sequela: Secondary | ICD-10-CM | POA: Diagnosis not present

## 2016-08-10 DIAGNOSIS — D573 Sickle-cell trait: Secondary | ICD-10-CM | POA: Insufficient documentation

## 2016-08-10 DIAGNOSIS — L509 Urticaria, unspecified: Secondary | ICD-10-CM | POA: Insufficient documentation

## 2016-08-10 DIAGNOSIS — Z818 Family history of other mental and behavioral disorders: Secondary | ICD-10-CM | POA: Insufficient documentation

## 2016-08-10 DIAGNOSIS — L299 Pruritus, unspecified: Secondary | ICD-10-CM | POA: Diagnosis not present

## 2016-08-10 DIAGNOSIS — Z885 Allergy status to narcotic agent status: Secondary | ICD-10-CM | POA: Insufficient documentation

## 2016-08-10 DIAGNOSIS — X58XXXS Exposure to other specified factors, sequela: Secondary | ICD-10-CM | POA: Insufficient documentation

## 2016-08-10 DIAGNOSIS — Z87891 Personal history of nicotine dependence: Secondary | ICD-10-CM | POA: Diagnosis not present

## 2016-08-10 DIAGNOSIS — R109 Unspecified abdominal pain: Secondary | ICD-10-CM | POA: Diagnosis present

## 2016-08-10 LAB — CBC
HEMATOCRIT: 29.6 % — AB (ref 36.0–46.0)
HEMOGLOBIN: 10 g/dL — AB (ref 12.0–15.0)
MCH: 26.7 pg (ref 26.0–34.0)
MCHC: 33.8 g/dL (ref 30.0–36.0)
MCV: 78.9 fL (ref 78.0–100.0)
Platelets: 140 10*3/uL — ABNORMAL LOW (ref 150–400)
RBC: 3.75 MIL/uL — AB (ref 3.87–5.11)
RDW: 13.9 % (ref 11.5–15.5)
WBC: 6.3 10*3/uL (ref 4.0–10.5)

## 2016-08-10 MED ORDER — FLEET ENEMA 7-19 GM/118ML RE ENEM
1.0000 | ENEMA | Freq: Once | RECTAL | Status: DC
Start: 2016-08-10 — End: 2016-08-10

## 2016-08-10 MED ORDER — KETOROLAC TROMETHAMINE 60 MG/2ML IM SOLN
60.0000 mg | Freq: Once | INTRAMUSCULAR | Status: AC
  Administered 2016-08-10: 60 mg via INTRAMUSCULAR
  Filled 2016-08-10: qty 2

## 2016-08-10 MED ORDER — HYDROMORPHONE HCL 1 MG/ML IJ SOLN
1.0000 mg | Freq: Once | INTRAMUSCULAR | Status: AC
  Administered 2016-08-10: 1 mg via INTRAMUSCULAR
  Filled 2016-08-10: qty 1

## 2016-08-10 MED ORDER — HYDROXYZINE PAMOATE 25 MG PO CAPS: 25.0000 mg | ORAL_CAPSULE | Freq: Three times a day (TID) | ORAL | 0 refills | Status: DC | PRN

## 2016-08-10 MED ORDER — DIPHENHYDRAMINE HCL 12.5 MG/5ML PO ELIX
12.5000 mg | ORAL_SOLUTION | Freq: Once | ORAL | Status: AC
  Administered 2016-08-10: 12.5 mg via ORAL
  Filled 2016-08-10: qty 5

## 2016-08-10 MED ORDER — HYDROCORTISONE 1 % EX CREA
TOPICAL_CREAM | Freq: Once | CUTANEOUS | Status: AC
  Administered 2016-08-10: 02:00:00 via TOPICAL
  Filled 2016-08-10: qty 28

## 2016-08-10 NOTE — Discharge Instructions (Signed)
Pruritus Pruritus is an itching feeling. There are many different conditions and factors that can make your skin itchy. Dry skin is one of the most common causes of itching. Most cases of itching do not require medical attention. Itchy skin can turn into a rash. Follow these instructions at home: Watch your pruritus for any changes. Take these steps to help with your condition: Skin Care   Moisturize your skin as needed. A moisturizer that contains petroleum jelly is best for keeping moisture in your skin.  Take or apply medicines only as directed by your health care provider. This may include:  Corticosteroid cream.  Anti-itch lotions.  Oral anti-histamines.  Apply cool compresses to the affected areas.  Try taking a bath with:  Epsom salts. Follow the instructions on the packaging. You can get these at your local pharmacy or grocery store.  Baking soda. Pour a small amount into the bath as directed by your health care provider.  Colloidal oatmeal. Follow the instructions on the packaging. You can get this at your local pharmacy or grocery store.  Try applying baking soda paste to your skin. Stir water into baking soda until it reaches a paste-like consistency.  Do not scratch your skin.  Avoid hot showers or baths, which can make itching worse. A cold shower may help with itching as long as you use a moisturizer after.  Avoid scented soaps, detergents, and perfumes. Use gentle soaps, detergents, perfumes, and other cosmetic products. General instructions   Avoid wearing tight clothes.  Keep a journal to help track what causes your itch. Write down:  What you eat.  What cosmetic products you use.  What you drink.  What you wear. This includes jewelry.  Use a humidifier. This keeps the air moist, which helps to prevent dry skin. Contact a health care provider if:  The itching does not go away after several days.  You sweat at night.  You have weight loss.  You  are unusually thirsty.  You urinate more than normal.  You are more tired than normal.  You have abdominal pain.  Your skin tingles.  You feel weak.  Your skin or the whites of your eyes look yellow (jaundice).  Your skin feels numb. This information is not intended to replace advice given to you by your health care provider. Make sure you discuss any questions you have with your health care provider. Document Released: 12/25/2010 Document Revised: 09/20/2015 Document Reviewed: 04/10/2014 Elsevier Interactive Patient Education  2017 Elsevier Inc.   Rash A rash is a change in the color of the skin. A rash can also change the way your skin feels. There are many different conditions and factors that can cause a rash. Follow these instructions at home: Pay attention to any changes in your symptoms. Follow these instructions to help with your condition: Medicine  Take or apply over-the-counter and prescription medicines only as told by your doctor. These may include:  Corticosteroid cream.  Anti-itch lotions.  Oral antihistamines. Skin Care   Put cool compresses on the affected areas.  Try taking a bath with:  Epsom salts. Follow the instructions on the packaging. You can get these at your local pharmacy or grocery store.  Baking soda. Pour a small amount into the bath as told by your doctor.  Colloidal oatmeal. Follow the instructions on the packaging. You can get this at your local pharmacy or grocery store.  Try putting baking soda paste onto your skin. Stir water into baking soda  until it gets like a paste.  Do not scratch or rub your skin.  Avoid covering the rash. Make sure the rash is exposed to air as much as possible. General instructions   Avoid hot showers or baths, which can make itching worse. A cold shower may help.  Avoid scented soaps, detergents, and perfumes. Use gentle soaps, detergents, perfumes, and other cosmetic products.  Avoid anything that  causes your rash. Keep a journal to help track what causes your rash. Write down:  What you eat.  What cosmetic products you use.  What you drink.  What you wear. This includes jewelry.  Keep all follow-up visits as told by your doctor. This is important. Contact a doctor if:  You sweat at night.  You lose weight.  You pee (urinate) more than normal.  You feel weak.  You throw up (vomit).  Your skin or the whites of your eyes look yellow (jaundice).  Your skin:  Tingles.  Is numb.  Your rash:  Does not go away after a few days.  Gets worse.  You are:  More thirsty than normal.  More tired than normal.  You have:  New symptoms.  Pain in your belly (abdomen).  A fever.  Watery poop (diarrhea). Get help right away if:  Your rash covers all or most of your body. The rash may or may not be painful.  You have blisters that:  Are on top of the rash.  Grow larger.  Grow together.  Are painful.  Are inside your nose or mouth.  You have a rash that:  Looks like purple pinprick-sized spots all over your body.  Has a bull's eye or looks like a target.  Is red and painful, causes your skin to peel, and is not from being in the sun too long. This information is not intended to replace advice given to you by your health care provider. Make sure you discuss any questions you have with your health care provider. Document Released: 10/01/2007 Document Revised: 09/20/2015 Document Reviewed: 08/30/2014 Elsevier Interactive Patient Education  2017 [employer].

## 2016-08-10 NOTE — MAU Note (Signed)
Had repeat C/S on 08/06/16. Went home Friday. Having a lot of incisional pain. Everytime I take Dilaudid I start itching and have rash on abdomen, arms legs. Have not taken any pain meds since 12n Sat and I am hurting. Breastfeeding and goin well. Abd feels hard and having some cramping.

## 2016-08-10 NOTE — MAU Provider Note (Signed)
History     CSN: [id number]  Arrival date and time: 08/10/16 6213   First Provider Initiated Contact with Patient 08/10/16 0121      Chief Complaint  Patient presents with  . Incisional Pain  . Allergic Reaction   HPI   [name] is a 26 y.o. female [phone] here in MAU with possible allergic reaction. She is status post cesarean section on 4/11. Says she has hives from her C-section incision scar down into her vaginal area. The hives started Saturday morning. She has been taking dilaudid for pain; the last dose was 1200 noon. She has been taking it every 3 hours since Wednesday. She is allergic to percocet and was unable to take ibuprofen post partum due to her low platelet count. She has pain in the bottom of her stomach. The pain is crampy like.   The hives are only on her abdomen and a small spot on her back where her epidural was placed. She had this exact reaction the last time she had a C Section. She does not have hives at all on the other portions of her body. She has taken perocet/ vicodin for her teeth in the past without any problems.   Constipation: The last time she had a BM was Tuesday. She has had no BM since then. She has not taken anything for the constipation.  She has had no urge to use the bathroom.   OB History    Gravida Para Term Preterm AB Living   SAB TAB Ectopic Multiple Live Births   0 1 0 0 3      Past Medical History:  Diagnosis Date  . Anemia   . Eclampsia    with 1st preg  . GERD (gastroesophageal reflux disease)    post partum 1st preg.  [name] Headache   . Hypertension   . Pregnancy induced hypertension   . Preterm delivery   . Preterm labor   . Sickle cell trait [facility])     Past Surgical History:  Procedure Laterality Date  . CESAREAN SECTION    . CESAREAN SECTION  04/25/2011   Procedure: CESAREAN SECTION;  Surgeon: Tereso Newcomer, MD;  Location: [facility] ORS;  Service: Gynecology;  Laterality: N/A;  Repeat C/Section  .  CESAREAN SECTION WITH BILATERAL TUBAL LIGATION Bilateral 08/06/2016   Procedure: CESAREAN SECTION WITH BILATERAL TUBAL LIGATION;  Surgeon: Reva Bores, MD;  Location: [facility] BIRTHING SUIT[facility];  Service: Obstetrics;  Laterality: Bilateral;  . CHOLECYSTECTOMY    . CHOLECYSTECTOMY  2010    Family History  Problem Relation Age of Onset  . Hypertension Paternal Grandmother   . Diabetes Paternal Grandmother   . Heart disease Paternal Grandmother   . Diabetes Mother   . Cancer Paternal Grandfather     pancreatic  . Mental illness Brother   . Mental retardation Brother     Social History  Substance Use Topics  . Smoking status: Former Games developer  . Smokeless tobacco: Never Used  . Alcohol use No    Allergies:  Allergies  Allergen Reactions  . Percocet [Oxycodone-Acetaminophen] Hives    Prescriptions Prior to Admission  Medication Sig Dispense Refill Last Dose  . ferrous sulfate 325 (65 FE) MG tablet Take 1 tablet (325 mg total) by mouth 2 (two) times daily with a meal. 60 tablet 3 08/09/2016 at Unknown time  . HYDROmorphone (DILAUDID) 2 MG tablet Take 1 tablet (2 mg total) by mouth  every 3 (three) hours as needed for moderate pain or severe pain. 40 tablet 0 08/09/2016 at 1200  . acetaminophen (TYLENOL) 325 MG tablet Take 650 mg by mouth every 6 (six) hours as needed for mild pain, moderate pain or headache.   Taking  . diphenhydrAMINE (BENADRYL) 25 mg capsule Take 50 mg by mouth daily as needed for allergies.     . Prenatal Vit-Fe Fumarate-FA (PREPLUS) 27-1 MG TABS Take 1 tablet by mouth daily. 30 tablet 13 Taking  . zolpidem (AMBIEN) 10 MG tablet Take 1 tablet (10 mg total) by mouth at bedtime as needed for sleep. (Patient not taking: Reported on 07/30/2016) 30 tablet 3 Not Taking at Unknown time   Results for orders placed or performed during the hospital encounter of 08/10/16 (from the past 72 hour(s))  CBC     Status: Abnormal   Collection Time: 08/10/16  2:02 AM  Result Value Ref Range    WBC 6.3 4.0 - 10.5 K/uL   RBC 3.75 (L) 3.87 - 5.11 MIL/uL   Hemoglobin 10.0 (L) 12.0 - 15.0 g/dL   HCT 95.6 (L) 38.7 - 56.4 %   MCV 78.9 78.0 - 100.0 fL   MCH 26.7 26.0 - 34.0 pg   MCHC 33.8 30.0 - 36.0 g/dL   RDW 33.2 95.1 - 88.4 %   Platelets 140 (L) 150 - 400 K/uL    Review of Systems  Constitutional: Negative for fever.  Gastrointestinal: Positive for constipation.  Skin: Positive for rash.   Physical Exam   Blood pressure 110/70, pulse 68, temperature 98.9 F (37.2 C), temperature source Oral, resp. rate 18, height 5' (1.524 m), weight 176 lb (79.8 kg), SpO2 99 %, currently breastfeeding.  Physical Exam  Constitutional: She is oriented to person, place, and time. She appears well-developed and well-nourished. No distress.  HENT:  Head: Normocephalic.  Eyes: Pupils are equal, round, and reactive to light.  GI: Soft. She exhibits no distension. There is no tenderness. There is no rebound and no guarding.  Musculoskeletal: Normal range of motion.  Neurological: She is alert and oriented to person, place, and time.  Skin: Skin is warm. Rash noted. Rash is urticarial. She is not diaphoretic. There is erythema.     Psychiatric: Her behavior is normal.    MAU Course  Procedures  None  MDM  Benadryl 12.5 mg hydrocortisone cream applied by RN Dilaudid 1 mg IM CBC  Platelets increased to 140> will give dose of Toradol 60 mg IM  Allergic reaction likely 2/2 to a contact dermatitis> ?betadine. Pruritis/ urticaria   localized to abdomen, vagina (Foley placed and used betadine likely)  and small area on her back where her epidural was placed.  Patient has used Percocet and Vicodin in the past with no problems.  Pain significantly improved.   Assessment and Plan   A:  Allergic reaction, sequela  Pruritus  Constipation, unspecified constipation type    P:  Discharge home in stable condition Rx: Vistaril  Continue dilaudid as needed Alternate Ibuprofen PO as  directed on the bottle  Follow up with OB as schedule for postpartum visit Return to MAU if symptoms worsen    Duane Lope, NP 08/11/2016 8:59 AM

## 2016-08-20 ENCOUNTER — Inpatient Hospital Stay (HOSPITAL_COMMUNITY)
Admission: AD | Admit: 2016-08-20 | Discharge: 2016-08-20 | Disposition: A | Discharge status: Home / Self Care | Payer: Medicaid Other | Source: Ambulatory Visit | Attending: Obstetrics and Gynecology | Admitting: Obstetrics and Gynecology

## 2016-08-20 ENCOUNTER — Encounter (HOSPITAL_COMMUNITY): Payer: Self-pay

## 2016-08-20 DIAGNOSIS — Z9889 Other specified postprocedural states: Secondary | ICD-10-CM | POA: Insufficient documentation

## 2016-08-20 DIAGNOSIS — B372 Candidiasis of skin and nail: Secondary | ICD-10-CM | POA: Insufficient documentation

## 2016-08-20 DIAGNOSIS — Z87891 Personal history of nicotine dependence: Secondary | ICD-10-CM | POA: Diagnosis not present

## 2016-08-20 DIAGNOSIS — D573 Sickle-cell trait: Secondary | ICD-10-CM | POA: Diagnosis not present

## 2016-08-20 DIAGNOSIS — K5900 Constipation, unspecified: Secondary | ICD-10-CM | POA: Insufficient documentation

## 2016-08-20 DIAGNOSIS — Z818 Family history of other mental and behavioral disorders: Secondary | ICD-10-CM | POA: Diagnosis not present

## 2016-08-20 DIAGNOSIS — Z833 Family history of diabetes mellitus: Secondary | ICD-10-CM | POA: Diagnosis not present

## 2016-08-20 LAB — URINALYSIS, ROUTINE W REFLEX MICROSCOPIC
Bilirubin Urine: NEGATIVE
Glucose, UA: NEGATIVE mg/dL
KETONES UR: NEGATIVE mg/dL
Leukocytes, UA: NEGATIVE
NITRITE: NEGATIVE
PROTEIN: NEGATIVE mg/dL
Specific Gravity, Urine: 1.015 (ref 1.005–1.030)
pH: 5.5 (ref 5.0–8.0)

## 2016-08-20 LAB — URINALYSIS, MICROSCOPIC (REFLEX)
Bacteria, UA: NONE SEEN
RBC / HPF: NONE SEEN RBC/hpf (ref 0–5)

## 2016-08-20 MED ORDER — FERROUS SULFATE 325 (65 FE) MG PO TABS: 325.0000 mg | ORAL_TABLET | Freq: Every day | ORAL | 0 refills | Status: DC

## 2016-08-20 MED ORDER — NYSTATIN 100000 UNIT/GM EX CREA: TOPICAL_CREAM | CUTANEOUS | 0 refills | Status: DC

## 2016-08-20 MED ORDER — TRIAMCINOLONE ACETONIDE 0.1 % EX CREA: 1.0000 "application " | TOPICAL_CREAM | Freq: Two times a day (BID) | CUTANEOUS | 0 refills | Status: DC

## 2016-08-20 NOTE — Discharge Instructions (Signed)
Continue stool softener twice daily.  Decrease iron supplement to once daily.    Constipation, Adult Constipation is when a person has fewer bowel movements in a week than normal, has difficulty having a bowel movement, or has stools that are dry, hard, or larger than normal. Constipation may be caused by an underlying condition. It may become worse with age if a person takes certain medicines and does not take in enough fluids. Follow these instructions at home: Eating and drinking    Eat foods that have a lot of fiber, such as fresh fruits and vegetables, whole grains, and beans.  Limit foods that are high in fat, low in fiber, or overly processed, such as french fries, hamburgers, cookies, candies, and soda.  Drink enough fluid to keep your urine clear or pale yellow. General instructions   Exercise regularly or as told by your health care provider.  Go to the restroom when you have the urge to go. Do not hold it in.  Take over-the-counter and prescription medicines only as told by your health care provider. These include any fiber supplements.  Practice pelvic floor retraining exercises, such as deep breathing while relaxing the lower abdomen and pelvic floor relaxation during bowel movements.  Watch your condition for any changes.  Keep all follow-up visits as told by your health care provider. This is important. Contact a health care provider if:  You have pain that gets worse.  You have a fever.  You do not have a bowel movement after 4 days.  You vomit.  You are not hungry.  You lose weight.  You are bleeding from the anus.  You have thin, pencil-like stools. Get help right away if:  You have a fever and your symptoms suddenly get worse.  You leak stool or have blood in your stool.  Your abdomen is bloated.  You have severe pain in your abdomen.  You feel dizzy or you faint. This information is not intended to replace advice given to you by your health  care provider. Make sure you discuss any questions you have with your health care provider. Document Released: 01/11/2004 Document Revised: 11/02/2015 Document Reviewed: 10/03/2015 Elsevier Interactive Patient Education  2017 [employer].

## 2016-08-20 NOTE — MAU Provider Note (Signed)
History     CSN: [id number]  Arrival date and time: 08/20/16 0901  First Provider Initiated Contact with Patient 08/20/16 [phone]      Chief Complaint  Patient presents with  . Wound Check  . Constipation   HPI  Renee Suarez is a 26 y.o. [phone] female who presents for wound check & constipation. Patient is 2 weeks s/p cesarean. Has noted irritation & bumps in the area surrounding her incision. Mild discomfort of incision; no drainage or bleeding noted.  Also reports constipation. Was seen in MAU last week for constipation & hives; tried an enema at home last week which resulted in a small BM. Has not had a BM since 4/15. Stopped taking dilaudid on 4/15. Denies n/v, fever, abdominal pain, or vaginal bleeding.   OB History    Gravida Para Term Preterm AB Living   SAB TAB Ectopic Multiple Live Births   0 1 0 0 3      Past Medical History:  Diagnosis Date  . Anemia   . Eclampsia    with 1st preg  . GERD (gastroesophageal reflux disease)    post partum 1st preg.  [name] Headache   . Hypertension   . Pregnancy induced hypertension   . Preterm delivery   . Preterm labor   . Sickle cell trait [facility])     Past Surgical History:  Procedure Laterality Date  . CESAREAN SECTION    . CESAREAN SECTION  04/25/2011   Procedure: CESAREAN SECTION;  Surgeon: Tereso Newcomer, MD;  Location: [facility];  Service: Gynecology;  Laterality: N/A;  Repeat C/Section  . CESAREAN SECTION WITH BILATERAL TUBAL LIGATION Bilateral 08/06/2016   Procedure: CESAREAN SECTION WITH BILATERAL TUBAL LIGATION;  Surgeon: Reva Bores, MD;  Location: [facility] BIRTHING SUITES;  Service: Obstetrics;  Laterality: Bilateral;  . CHOLECYSTECTOMY    . CHOLECYSTECTOMY  2010    Family History  Problem Relation Age of Onset  . Hypertension Paternal Grandmother   . Diabetes Paternal Grandmother   . Heart disease Paternal Grandmother   . Diabetes Mother   . Cancer Paternal Grandfather     pancreatic  . Mental  illness Brother   . Mental retardation Brother     Social History  Substance Use Topics  . Smoking status: Former Games developer  . Smokeless tobacco: Never Used  . Alcohol use No    Allergies:  Allergies  Allergen Reactions  . Iodine Solution [Povidone Iodine] Hives    Developed pin-point rash with itching where Betadine was used for C/S.    Prescriptions Prior to Admission  Medication Sig Dispense Refill Last Dose  . ferrous sulfate 325 (65 FE) MG tablet Take 1 tablet (325 mg total) by mouth 2 (two) times daily with a meal. 60 tablet 3 08/19/2016 at Unknown time  . Prenatal Vit-Fe Fumarate-FA (PREPLUS) 27-1 MG TABS Take 1 tablet by mouth daily. 30 tablet 13 08/19/2016 at Unknown time  . HYDROmorphone (DILAUDID) 2 MG tablet Take 1 tablet (2 mg total) by mouth every 3 (three) hours as needed for moderate pain or severe pain. (Patient not taking: Reported on 08/20/2016) 40 tablet 0 Not Taking at Unknown time  . hydrOXYzine (VISTARIL) 25 MG capsule Take 1 capsule (25 mg total) by mouth 3 (three) times daily as needed. (Patient not taking: Reported on 08/20/2016) 15 capsule 0 Completed Course at Unknown time    Review of Systems  Constitutional: Negative.   Gastrointestinal: Positive  for abdominal pain and constipation. Negative for abdominal distention, anal bleeding, diarrhea, nausea, rectal pain and vomiting.  Genitourinary: Negative.   Skin: Positive for rash and wound.   Physical Exam   Blood pressure 119/61, pulse 68, temperature 98.2 F (36.8 C), temperature source Oral, resp. rate 17, height 5' (1.524 m), weight 168 lb 4 oz (76.3 kg), SpO2 100 %, currently breastfeeding.  Physical Exam  Nursing note and vitals reviewed. Constitutional: She is oriented to person, place, and time. She appears well-developed and well-nourished. No distress.  HENT:  Head: Normocephalic and atraumatic.  Eyes: Conjunctivae are normal. Right eye exhibits no discharge. Left eye exhibits no discharge. No  scleral icterus.  Neck: Normal range of motion.  Cardiovascular: Normal rate, regular rhythm and normal heart sounds.   No murmur heard. Respiratory: Effort normal and breath sounds normal. No respiratory distress. She has no wheezes.  GI: Soft. Bowel sounds are normal. She exhibits no distension. There is no tenderness. There is no rebound and no guarding.  Neurological: She is alert and oriented to person, place, and time.  Skin: Skin is warm and dry. She is not diaphoretic.  Dry, scaly rash 2 inch border around abdominal incision. No erythema, warmth or drainage. Hyperpigmented area in bilateral groin.  Incision well approximated, no drainage or evidence of infection. 0.5cm superficial opening on right edge of incision; no drainage & no tunneling.   Psychiatric: She has a normal mood and affect. Her behavior is normal. Judgment and thought content normal.   MAU Course  Procedures Results for orders placed or performed during the hospital encounter of 08/20/16 (from the past 24 hour(s))  Urinalysis, Routine w reflex microscopic     Status: Abnormal   Collection Time: 08/20/16  9:10 AM  Result Value Ref Range   Color, Urine YELLOW YELLOW   APPearance CLEAR CLEAR   Specific Gravity, Urine 1.015 1.005 - 1.030   pH 5.5 5.0 - 8.0   Glucose, UA NEGATIVE NEGATIVE mg/dL   Hgb urine dipstick SMALL (A) NEGATIVE   Bilirubin Urine NEGATIVE NEGATIVE   Ketones, ur NEGATIVE NEGATIVE mg/dL   Protein, ur NEGATIVE NEGATIVE mg/dL   Nitrite NEGATIVE NEGATIVE   Leukocytes, UA NEGATIVE NEGATIVE  Urinalysis, Microscopic (reflex)     Status: Abnormal   Collection Time: 08/20/16  9:10 AM  Result Value Ref Range   RBC / HPF NONE SEEN 0 - 5 RBC/hpf   WBC, UA 0-5 0 - 5 WBC/hpf   Bacteria, UA NONE SEEN NONE SEEN   Squamous Epithelial / LPF 0-5 (A) NONE SEEN    MDM VSS, NAD steri strip applied to right aspect of incision  Pt declines enema or tx for constipation while in MAU  Assessment and Plan   A: 1. Constipation, unspecified constipation type   2. Intertriginous candidiasis    P: Discharge home Rx nystatin & triamcinolone Keep incision clean & dry Discussed treatment of constipation; can try at home enema, increase fiber & water intake, miralax, or warm prune juice Continue colace BID & decreased iron supplement to once daily Discussed reasons to return to MAU Keep f/u with OB  Judeth Horn 08/20/2016, 9:37 AM

## 2016-08-20 NOTE — MAU Note (Addendum)
Pt states she has a c/section on 4/11. Pt states she had an allergic reaction to her pain medication and was constipated. Pt was seen here last week for these issues. Pt states two days ago she started noticing some bumps that are sore on her abdomen around her incision. Pt states the bumps have pus coming out of them. Pt states she had a bowel movement on the 17th after she did an enema but hasn't had a bowel movement since then. Pt states she has been taking a stool softener at home.

## 2016-08-25 ENCOUNTER — Ambulatory Visit (INDEPENDENT_AMBULATORY_CARE_PROVIDER_SITE_OTHER): Payer: Medicaid Other | Admitting: Obstetrics

## 2016-08-25 ENCOUNTER — Encounter: Payer: Self-pay | Admitting: Obstetrics

## 2016-08-25 VITALS — BP 106/71 | HR 68 | Ht 61.0 in | Wt 168.2 lb

## 2016-08-25 DIAGNOSIS — T8149XA Infection following a procedure, other surgical site, initial encounter: Secondary | ICD-10-CM | POA: Insufficient documentation

## 2016-08-25 DIAGNOSIS — IMO0001 Reserved for inherently not codable concepts without codable children: Secondary | ICD-10-CM

## 2016-08-25 DIAGNOSIS — O860 Infection of obstetric surgical wound: Secondary | ICD-10-CM | POA: Diagnosis not present

## 2016-08-25 DIAGNOSIS — T814XXD Infection following a procedure, subsequent encounter: Principal | ICD-10-CM

## 2016-08-25 MED ORDER — IBUPROFEN 800 MG PO TABS: 800.0000 mg | ORAL_TABLET | Freq: Three times a day (TID) | ORAL | 5 refills | Status: DC | PRN

## 2016-08-25 MED ORDER — AMOXICILLIN-POT CLAVULANATE 875-125 MG PO TABS: 1.0000 | ORAL_TABLET | Freq: Two times a day (BID) | ORAL | 0 refills | Status: DC

## 2016-08-25 NOTE — Progress Notes (Signed)
Patient ID: Renee Suarez, female   DOB: 1991/01/23, 26 y.o.   MRN: [id number]  Chief Complaint  Patient presents with  . Wound Check    HPI Renee Suarez is a 26 y.o. female.  Malodorous drainage left corner of incision.   Incision opened in left corner yesterday and bled, along with a malodorous drainage.  S/P LTCS 2 weeks ago. HPI  Past Medical History:  Diagnosis Date  . Anemia   . Eclampsia    with 1st preg  . GERD (gastroesophageal reflux disease)    post partum 1st preg.  [name] Headache   . Hypertension   . Pregnancy induced hypertension   . Preterm delivery   . Preterm labor   . Sickle cell trait [facility])     Past Surgical History:  Procedure Laterality Date  . CESAREAN SECTION    . CESAREAN SECTION  04/25/2011   Procedure: CESAREAN SECTION;  Surgeon: Tereso Newcomer, MD;  Location: [facility];  Service: Gynecology;  Laterality: N/A;  Repeat C/Section  . CESAREAN SECTION WITH BILATERAL TUBAL LIGATION Bilateral 08/06/2016   Procedure: CESAREAN SECTION WITH BILATERAL TUBAL LIGATION;  Surgeon: Reva Bores, MD;  Location: [facility] BIRTHING SUITES;  Service: Obstetrics;  Laterality: Bilateral;  . CHOLECYSTECTOMY    . CHOLECYSTECTOMY  2010    Family History  Problem Relation Age of Onset  . Hypertension Paternal Grandmother   . Diabetes Paternal Grandmother   . Heart disease Paternal Grandmother   . Diabetes Mother   . Cancer Paternal Grandfather     pancreatic  . Mental illness Brother   . Mental retardation Brother     Social History Social History  Substance Use Topics  . Smoking status: Former Games developer  . Smokeless tobacco: Never Used  . Alcohol use No    Allergies  Allergen Reactions  . Iodine Solution [Povidone Iodine] Hives    Developed pin-point rash with itching where Betadine was used for C/S.    Current Outpatient Prescriptions  Medication Sig Dispense Refill  . acetaminophen (TYLENOL) 500 MG tablet Take 1,000 mg by mouth every 6 (six) hours as needed  for mild pain or moderate pain.    [name] docusate sodium (COLACE) 100 MG capsule Take 100 mg by mouth 2 (two) times daily.    . ferrous sulfate 325 (65 FE) MG tablet Take 1 tablet (325 mg total) by mouth daily with breakfast. 30 tablet 0  . nystatin cream (MYCOSTATIN) Apply to affected area 2 times daily 30 g 0  . Prenatal Vit-Fe Fumarate-FA (PREPLUS) 27-1 MG TABS Take 1 tablet by mouth daily. 30 tablet 13  . triamcinolone cream (KENALOG) 0.1 % Apply 1 application topically 2 (two) times daily. 30 g 0  . amoxicillin-clavulanate (AUGMENTIN) 875-125 MG tablet Take 1 tablet by mouth 2 (two) times daily. 14 tablet 0  . ibuprofen (ADVIL,MOTRIN) 800 MG tablet Take 1 tablet (800 mg total) by mouth every 8 (eight) hours as needed. 30 tablet 5   No current facility-administered medications for this visit.     Review of Systems Review of Systems Constitutional: negative for fatigue and weight loss Respiratory: negative for cough and wheezing Cardiovascular: negative for chest pain, fatigue and palpitations Gastrointestinal: negative for abdominal pain and change in bowel habits Genitourinary:positive for malodorous drainage and bleeding from C/S incision Integument/breast: negative for nipple discharge Musculoskeletal:negative for myalgias Neurological: negative for gait problems and tremors Behavioral/Psych: negative for abusive relationship, depression Endocrine: negative for temperature intolerance  Blood pressure 106/71, pulse 68, height  (1.549 m), weight 168 lb 3.2 oz (76.3 kg), currently breastfeeding.  Physical Exam Physical Exam General:   alert  Skin:   no rash or abnormalities  Lungs:   clear to auscultation bilaterally  Heart:   regular rate and rhythm, S1, S2 normal, no murmur, click, rub or gallop  Breasts:   normal without suspicious masses, skin or nipple changes or axillary nodes  Abdomen:  normal findings: no organomegaly, soft, non-tender and no hernia.  Incision  clean, dry and intact.  Closed, no drainage or bleeding.  Small area in left corner of incision that is pink, bu t closed and no drainage could be expressed with pressure, and could not probe with Q-tip  Pelvis:   Deferred    50% of 15 min visit spent on counseling and coordination of care.    Data Reviewed CBC  Assessment     Postoperative wound infection.  Resolving well.    Plan    Augmentin Rx F/U in 4 weeks  No orders of the defined types were placed in this encounter.  Meds ordered this encounter  Medications  . amoxicillin-clavulanate (AUGMENTIN) 875-125 MG tablet    Sig: Take 1 tablet by mouth 2 (two) times daily.    Dispense:  14 tablet    Refill:  0  . ibuprofen (ADVIL,MOTRIN) 800 MG tablet    Sig: Take 1 tablet (800 mg total) by mouth every 8 (eight) hours as needed.    Dispense:  30 tablet    Refill:  5

## 2016-09-17 ENCOUNTER — Encounter: Payer: Self-pay | Admitting: Obstetrics and Gynecology

## 2016-09-17 ENCOUNTER — Ambulatory Visit (INDEPENDENT_AMBULATORY_CARE_PROVIDER_SITE_OTHER): Payer: Medicaid Other | Admitting: Obstetrics and Gynecology

## 2016-09-17 NOTE — Patient Instructions (Signed)
Health Maintenance, Female Adopting a healthy lifestyle and getting preventive care can go a long way to promote health and wellness. Talk with your health care provider about what schedule of regular examinations is right for you. This is a good chance for you to check in with your provider about disease prevention and staying healthy. In between checkups, there are plenty of things you can do on your own. Experts have done a lot of research about which lifestyle changes and preventive measures are most likely to keep you healthy. Ask your health care provider for more information. Weight and diet Eat a healthy diet  Be sure to include plenty of vegetables, fruits, low-fat dairy products, and lean protein.  Do not eat a lot of foods high in solid fats, added sugars, or salt.  Get regular exercise. This is one of the most important things you can do for your health.  Most adults should exercise for at least 150 minutes each week. The exercise should increase your heart rate and make you sweat (moderate-intensity exercise).  Most adults should also do strengthening exercises at least twice a week. This is in addition to the moderate-intensity exercise. Maintain a healthy weight  Body mass index (BMI) is a measurement that can be used to identify possible weight problems. It estimates body fat based on height and weight. Your health care provider can help determine your BMI and help you achieve or maintain a healthy weight.  For females 76 years of age and older:  A BMI below 18.5 is considered underweight.  A BMI of 18.5 to 24.9 is normal.  A BMI of 25 to 29.9 is considered overweight.  A BMI of 30 and above is considered obese. Watch levels of cholesterol and blood lipids  You should start having your blood tested for lipids and cholesterol at 28 years of age, then have this test every 5 years.  You may need to have your cholesterol levels checked more often if:  Your lipid or  cholesterol levels are high.  You are older than 28 years of age.  You are at high risk for heart disease. Cancer screening Lung Cancer  Lung cancer screening is recommended for adults 64-42 years old who are at high risk for lung cancer because of a history of smoking.  A yearly low-dose CT scan of the lungs is recommended for people who:  Currently smoke.  Have quit within the past 15 years.  Have at least a 30-pack-year history of smoking. A pack year is smoking an average of one pack of cigarettes a day for 1 year.  Yearly screening should continue until it has been 15 years since you quit.  Yearly screening should stop if you develop a health problem that would prevent you from having lung cancer treatment. Breast Cancer  Practice breast self-awareness. This means understanding how your breasts normally appear and feel.  It also means doing regular breast self-exams. Let your health care provider know about any changes, no matter how small.  If you are in your 20s or 30s, you should have a clinical breast exam (CBE) by a health care provider every 1-3 years as part of a regular health exam.  If you are 34 or older, have a CBE every year. Also consider having a breast X-ray (mammogram) every year.  If you have a family history of breast cancer, talk to your health care provider about genetic screening.  If you are at high risk for breast cancer, talk  to your health care provider about having an MRI and a mammogram every year.  Breast cancer gene (BRCA) assessment is recommended for women who have family members with BRCA-related cancers. BRCA-related cancers include:  Breast.  Ovarian.  Tubal.  Peritoneal cancers.  Results of the assessment will determine the need for genetic counseling and BRCA1 and BRCA2 testing. Cervical Cancer  Your health care provider may recommend that you be screened regularly for cancer of the pelvic organs (ovaries, uterus, and vagina).  This screening involves a pelvic examination, including checking for microscopic changes to the surface of your cervix (Pap test). You may be encouraged to have this screening done every 3 years, beginning at age 24.  For women ages 66-65, health care providers may recommend pelvic exams and Pap testing every 3 years, or they may recommend the Pap and pelvic exam, combined with testing for human papilloma virus (HPV), every 5 years. Some types of HPV increase your risk of cervical cancer. Testing for HPV may also be done on women of any age with unclear Pap test results.  Other health care providers may not recommend any screening for nonpregnant women who are considered low risk for pelvic cancer and who do not have symptoms. Ask your health care provider if a screening pelvic exam is right for you.  If you have had past treatment for cervical cancer or a condition that could lead to cancer, you need Pap tests and screening for cancer for at least 20 years after your treatment. If Pap tests have been discontinued, your risk factors (such as having a new sexual partner) need to be reassessed to determine if screening should resume. Some women have medical problems that increase the chance of getting cervical cancer. In these cases, your health care provider may recommend more frequent screening and Pap tests. Colorectal Cancer  This type of cancer can be detected and often prevented.  Routine colorectal cancer screening usually begins at 28 years of age and continues through 28 years of age.  Your health care provider may recommend screening at an earlier age if you have risk factors for colon cancer.  Your health care provider may also recommend using home test kits to check for hidden blood in the stool.  A small camera at the end of a tube can be used to examine your colon directly (sigmoidoscopy or colonoscopy). This is done to check for the earliest forms of colorectal cancer.  Routine  screening usually begins at age 41.  Direct examination of the colon should be repeated every 5-10 years through 28 years of age. However, you may need to be screened more often if early forms of precancerous polyps or small growths are found. Skin Cancer  Check your skin from head to toe regularly.  Tell your health care provider about any new moles or changes in moles, especially if there is a change in a mole's shape or color.  Also tell your health care provider if you have a mole that is larger than the size of a pencil eraser.  Always use sunscreen. Apply sunscreen liberally and repeatedly throughout the day.  Protect yourself by wearing long sleeves, pants, a wide-brimmed hat, and sunglasses whenever you are outside. Heart disease, diabetes, and high blood pressure  High blood pressure causes heart disease and increases the risk of stroke. High blood pressure is more likely to develop in:  People who have blood pressure in the high end of the normal range (130-139/85-89 mm Hg).  People who are overweight or obese.  People who are African American.  If you are 59-24 years of age, have your blood pressure checked every 3-5 years. If you are 34 years of age or older, have your blood pressure checked every year. You should have your blood pressure measured twice-once when you are at a hospital or clinic, and once when you are not at a hospital or clinic. Record the average of the two measurements. To check your blood pressure when you are not at a hospital or clinic, you can use:  An automated blood pressure machine at a pharmacy.  A home blood pressure monitor.  If you are between 29 years and 60 years old, ask your health care provider if you should take aspirin to prevent strokes.  Have regular diabetes screenings. This involves taking a blood sample to check your fasting blood sugar level.  If you are at a normal weight and have a low risk for diabetes, have this test once  every three years after 28 years of age.  If you are overweight and have a high risk for diabetes, consider being tested at a younger age or more often. Preventing infection Hepatitis B  If you have a higher risk for hepatitis B, you should be screened for this virus. You are considered at high risk for hepatitis B if:  You were born in a country where hepatitis B is common. Ask your health care provider which countries are considered high risk.  Your parents were born in a high-risk country, and you have not been immunized against hepatitis B (hepatitis B vaccine).  You have HIV or AIDS.  You use needles to inject street drugs.  You live with someone who has hepatitis B.  You have had sex with someone who has hepatitis B.  You get hemodialysis treatment.  You take certain medicines for conditions, including cancer, organ transplantation, and autoimmune conditions. Hepatitis C  Blood testing is recommended for:  Everyone born from [birth year] through 1967.  Anyone with known risk factors for hepatitis C. Sexually transmitted infections (STIs)  You should be screened for sexually transmitted infections (STIs) including gonorrhea and chlamydia if:  You are sexually active and are younger than 28 years of age.  You are older than 28 years of age and your health care provider tells you that you are at risk for this type of infection.  Your sexual activity has changed since you were last screened and you are at an increased risk for chlamydia or gonorrhea. Ask your health care provider if you are at risk.  If you do not have HIV, but are at risk, it may be recommended that you take a prescription medicine daily to prevent HIV infection. This is called pre-exposure prophylaxis (PrEP). You are considered at risk if:  You are sexually active and do not regularly use condoms or know the HIV status of your partner(s).  You take drugs by injection.  You are sexually active with a partner  who has HIV. Talk with your health care provider about whether you are at high risk of being infected with HIV. If you choose to begin PrEP, you should first be tested for HIV. You should then be tested every 3 months for as long as you are taking PrEP. Pregnancy  If you are premenopausal and you may become pregnant, ask your health care provider about preconception counseling.  If you may become pregnant, take 400 to 800 micrograms (mcg) of folic acid  every day.  If you want to prevent pregnancy, talk to your health care provider about birth control (contraception). Osteoporosis and menopause  Osteoporosis is a disease in which the bones lose minerals and strength with aging. This can result in serious bone fractures. Your risk for osteoporosis can be identified using a bone density scan.  If you are 4 years of age or older, or if you are at risk for osteoporosis and fractures, ask your health care provider if you should be screened.  Ask your health care provider whether you should take a calcium or vitamin D supplement to lower your risk for osteoporosis.  Menopause may have certain physical symptoms and risks.  Hormone replacement therapy may reduce some of these symptoms and risks. Talk to your health care provider about whether hormone replacement therapy is right for you. Follow these instructions at home:  Schedule regular health, dental, and eye exams.  Stay current with your immunizations.  Do not use any tobacco products including cigarettes, chewing tobacco, or electronic cigarettes.  If you are pregnant, do not drink alcohol.  If you are breastfeeding, limit how much and how often you drink alcohol.  Limit alcohol intake to no more than 1 drink per day for nonpregnant women. One drink equals 12 ounces of beer, 5 ounces of wine, or 1 ounces of hard liquor.  Do not use street drugs.  Do not share needles.  Ask your health care provider for help if you need support  or information about quitting drugs.  Tell your health care provider if you often feel depressed.  Tell your health care provider if you have ever been abused or do not feel safe at home. This information is not intended to replace advice given to you by your health care provider. Make sure you discuss any questions you have with your health care provider. Document Released: 10/28/2010 Document Revised: 09/20/2015 Document Reviewed: 01/16/2015 Elsevier Interactive Patient Education  2017 [employer].

## 2016-09-17 NOTE — Progress Notes (Signed)
Subjective:     Renee Suarez is a 26 y.o. female who presents for a postpartum visit. She is 6 weeks postpartum following a low cervical transverse Cesarean section. I have fully reviewed the prenatal and intrapartum course. The delivery was at 39 gestational weeks. Outcome: repeat cesarean section, low transverse incision. Anesthesia: epidural. Postpartum course has been complicated by a small wound infection, which has completely resolved.. Baby's course has been UNREMARKABLE. Baby is feeding by breast. Bleeding no bleeding. Bowel function is normal. Bladder function is normal. Patient is not sexually active. Contraception method is tubal ligation. Postpartum depression screening: negative.  The following portions of the patient's history were reviewed and updated as appropriate: allergies, current medications, past family history, past medical history, past social history and past surgical history.  Review of Systems Pertinent items are noted in HPI.   Objective:    There were no vitals taken for this visit.  General:  alert   Breasts:  Not examined  Lungs: clear to auscultation bilaterally  Heart:  regular rate and rhythm, S1, S2 normal, no murmur, click, rub or gallop  Abdomen: soft, non-tender; bowel sounds normal; no masses,  no organomegaly, incision healing well   Vulva:  not evaluated  Vagina: not evaluated  Cervix:  not evaluated  Corpus: not examined  Adnexa:  not evaluated  Rectal Exam: Not performed.        Assessment:     Normal postpartum exam.   Plan:    1. Contraception: tubal ligation 2. Return to normal activities as tolerates 3. Follow up in: 1 yr or as needed.

## 2016-11-30 ENCOUNTER — Observation Stay (HOSPITAL_COMMUNITY)
Admission: EM | Admit: 2016-11-30 | Discharge: 2016-12-01 | Disposition: A | Discharge status: Home / Self Care | Payer: Medicaid Other | Attending: General Surgery | Admitting: General Surgery

## 2016-11-30 ENCOUNTER — Emergency Department (HOSPITAL_COMMUNITY): Payer: Medicaid Other | Admitting: Certified Registered"

## 2016-11-30 ENCOUNTER — Encounter (HOSPITAL_COMMUNITY)
Admission: EM | Disposition: A | Discharge status: Home / Self Care | Payer: Self-pay | Source: Home / Self Care | Attending: Emergency Medicine

## 2016-11-30 ENCOUNTER — Encounter (HOSPITAL_COMMUNITY): Payer: Self-pay | Admitting: Emergency Medicine

## 2016-11-30 ENCOUNTER — Emergency Department (HOSPITAL_COMMUNITY): Payer: Medicaid Other

## 2016-11-30 DIAGNOSIS — Z87891 Personal history of nicotine dependence: Secondary | ICD-10-CM | POA: Insufficient documentation

## 2016-11-30 DIAGNOSIS — K352 Acute appendicitis with generalized peritonitis, without abscess: Secondary | ICD-10-CM

## 2016-11-30 DIAGNOSIS — K219 Gastro-esophageal reflux disease without esophagitis: Secondary | ICD-10-CM | POA: Insufficient documentation

## 2016-11-30 DIAGNOSIS — D573 Sickle-cell trait: Secondary | ICD-10-CM | POA: Diagnosis not present

## 2016-11-30 DIAGNOSIS — K3580 Unspecified acute appendicitis: Secondary | ICD-10-CM | POA: Diagnosis present

## 2016-11-30 DIAGNOSIS — I10 Essential (primary) hypertension: Secondary | ICD-10-CM | POA: Insufficient documentation

## 2016-11-30 DIAGNOSIS — Z9049 Acquired absence of other specified parts of digestive tract: Secondary | ICD-10-CM

## 2016-11-30 HISTORY — DX: Acquired absence of other specified parts of digestive tract: Z90.49

## 2016-11-30 HISTORY — PX: LAPAROSCOPIC APPENDECTOMY: SHX408

## 2016-11-30 LAB — CBC WITH DIFFERENTIAL/PLATELET
Basophils Absolute: 0 10*3/uL (ref 0.0–0.1)
Basophils Relative: 0 %
EOS PCT: 0 %
Eosinophils Absolute: 0 10*3/uL (ref 0.0–0.7)
HCT: 38.1 % (ref 36.0–46.0)
HEMOGLOBIN: 13 g/dL (ref 12.0–15.0)
LYMPHS ABS: 1 10*3/uL (ref 0.7–4.0)
LYMPHS PCT: 8 %
MCH: 26.2 pg (ref 26.0–34.0)
MCHC: 34.1 g/dL (ref 30.0–36.0)
MCV: 76.7 fL — AB (ref 78.0–100.0)
MONOS PCT: 3 %
Monocytes Absolute: 0.4 10*3/uL (ref 0.1–1.0)
NEUTROS PCT: 89 %
Neutro Abs: 11.5 10*3/uL — ABNORMAL HIGH (ref 1.7–7.7)
Platelets: 168 10*3/uL (ref 150–400)
RBC: 4.97 MIL/uL (ref 3.87–5.11)
RDW: 13.5 % (ref 11.5–15.5)
WBC: 12.9 10*3/uL — AB (ref 4.0–10.5)

## 2016-11-30 LAB — I-STAT BETA HCG BLOOD, ED (MC, WL, AP ONLY)

## 2016-11-30 LAB — URINALYSIS, ROUTINE W REFLEX MICROSCOPIC
Bilirubin Urine: NEGATIVE
Glucose, UA: NEGATIVE mg/dL
KETONES UR: NEGATIVE mg/dL
Leukocytes, UA: NEGATIVE
Nitrite: NEGATIVE
PROTEIN: NEGATIVE mg/dL
Specific Gravity, Urine: 1.018 (ref 1.005–1.030)
pH: 6 (ref 5.0–8.0)

## 2016-11-30 LAB — LIPASE, BLOOD: Lipase: 28 U/L (ref 11–51)

## 2016-11-30 LAB — I-STAT CG4 LACTIC ACID, ED: LACTIC ACID, VENOUS: 1.54 mmol/L (ref 0.5–1.9)

## 2016-11-30 LAB — COMPREHENSIVE METABOLIC PANEL
ALBUMIN: 4.3 g/dL (ref 3.5–5.0)
ALK PHOS: 41 U/L (ref 38–126)
ALT: 12 U/L — AB (ref 14–54)
AST: 17 U/L (ref 15–41)
Anion gap: 10 (ref 5–15)
BUN: 10 mg/dL (ref 6–20)
CHLORIDE: 108 mmol/L (ref 101–111)
CO2: 22 mmol/L (ref 22–32)
CREATININE: 0.62 mg/dL (ref 0.44–1.00)
Calcium: 9.1 mg/dL (ref 8.9–10.3)
GFR calc non Af Amer: >60 mL/min (ref >60)
GLUCOSE: 103 mg/dL — AB (ref 65–99)
Potassium: 3.6 mmol/L (ref 3.5–5.1)
SODIUM: 140 mmol/L (ref 135–145)
Total Bilirubin: 0.8 mg/dL (ref 0.3–1.2)
Total Protein: 7.8 g/dL (ref 6.5–8.1)

## 2016-11-30 SURGERY — APPENDECTOMY, LAPAROSCOPIC
Anesthesia: General | Site: Abdomen

## 2016-11-30 MED ORDER — METRONIDAZOLE IN NACL 5-0.79 MG/ML-% IV SOLN
500.0000 mg | Freq: Once | INTRAVENOUS | Status: AC
  Administered 2016-11-30: 500 mg via INTRAVENOUS
  Filled 2016-11-30: qty 100

## 2016-11-30 MED ORDER — DIPHENHYDRAMINE HCL 50 MG/ML IJ SOLN
INTRAMUSCULAR | Status: DC | PRN
  Administered 2016-11-30: 12.5 mg via INTRAVENOUS

## 2016-11-30 MED ORDER — LACTATED RINGERS IR SOLN
Status: DC | PRN
  Administered 2016-11-30: 3000 mL

## 2016-11-30 MED ORDER — SCOPOLAMINE 1 MG/3DAYS TD PT72
MEDICATED_PATCH | TRANSDERMAL | Status: AC
  Filled 2016-11-30: qty 1

## 2016-11-30 MED ORDER — SODIUM CHLORIDE 0.9 % IV SOLN
INTRAVENOUS | Status: DC
Start: 2016-11-30 — End: 2016-12-01
  Administered 2016-11-30: 21:00:00 via INTRAVENOUS

## 2016-11-30 MED ORDER — LIDOCAINE 2% (20 MG/ML) 5 ML SYRINGE
INTRAMUSCULAR | Status: DC | PRN
  Administered 2016-11-30: 80 mg via INTRAVENOUS

## 2016-11-30 MED ORDER — DEXAMETHASONE SODIUM PHOSPHATE 10 MG/ML IJ SOLN
INTRAMUSCULAR | Status: AC
Start: 2016-11-30 — End: ?
  Filled 2016-11-30: qty 1

## 2016-11-30 MED ORDER — KETAMINE HCL-SODIUM CHLORIDE 100-0.9 MG/10ML-% IV SOSY
0.3000 mg/kg | PREFILLED_SYRINGE | Freq: Once | INTRAVENOUS | Status: AC
  Administered 2016-11-30: 26 mg via INTRAVENOUS
  Filled 2016-11-30: qty 10

## 2016-11-30 MED ORDER — ONDANSETRON 4 MG PO TBDP: 4.0000 mg | ORAL_TABLET | Freq: Four times a day (QID) | ORAL | Status: DC | PRN

## 2016-11-30 MED ORDER — ONDANSETRON HCL 4 MG/2ML IJ SOLN
INTRAMUSCULAR | Status: AC
  Filled 2016-11-30: qty 2

## 2016-11-30 MED ORDER — HYDROMORPHONE HCL 1 MG/ML IJ SOLN
1.0000 mg | Freq: Once | INTRAMUSCULAR | Status: AC
  Administered 2016-11-30: 1 mg via INTRAVENOUS
  Filled 2016-11-30: qty 1

## 2016-11-30 MED ORDER — ONDANSETRON HCL 4 MG/2ML IJ SOLN: 4.0000 mg | Freq: Four times a day (QID) | INTRAMUSCULAR | Status: DC | PRN

## 2016-11-30 MED ORDER — FENTANYL CITRATE (PF) 250 MCG/5ML IJ SOLN
INTRAMUSCULAR | Status: DC | PRN
  Administered 2016-11-30 (×3): 50 ug via INTRAVENOUS
  Administered 2016-11-30: 100 ug via INTRAVENOUS

## 2016-11-30 MED ORDER — SODIUM CHLORIDE 0.9 % IV BOLUS (SEPSIS)
1000.0000 mL | Freq: Once | INTRAVENOUS | Status: AC
  Administered 2016-11-30: 1000 mL via INTRAVENOUS

## 2016-11-30 MED ORDER — DIPHENHYDRAMINE HCL 50 MG/ML IJ SOLN
INTRAMUSCULAR | Status: AC
  Filled 2016-11-30: qty 2

## 2016-11-30 MED ORDER — IOPAMIDOL (ISOVUE-300) INJECTION 61%
INTRAVENOUS | Status: AC
  Filled 2016-11-30: qty 100

## 2016-11-30 MED ORDER — PROMETHAZINE HCL 25 MG/ML IJ SOLN: 6.2500 mg | INTRAMUSCULAR | Status: DC | PRN

## 2016-11-30 MED ORDER — MORPHINE SULFATE (PF) 2 MG/ML IV SOLN
2.0000 mg | INTRAVENOUS | Status: DC | PRN
  Administered 2016-11-30 – 2016-12-01 (×4): 2 mg via INTRAVENOUS
  Filled 2016-11-30 (×4): qty 1

## 2016-11-30 MED ORDER — BUPIVACAINE-EPINEPHRINE (PF) 0.25% -1:200000 IJ SOLN
INTRAMUSCULAR | Status: AC
  Filled 2016-11-30: qty 30

## 2016-11-30 MED ORDER — ONDANSETRON HCL 4 MG/2ML IJ SOLN
INTRAMUSCULAR | Status: DC | PRN
  Administered 2016-11-30 (×2): 4 mg via INTRAVENOUS

## 2016-11-30 MED ORDER — SIMETHICONE 80 MG PO CHEW: 40.0000 mg | CHEWABLE_TABLET | Freq: Four times a day (QID) | ORAL | Status: DC | PRN

## 2016-11-30 MED ORDER — SUGAMMADEX SODIUM 200 MG/2ML IV SOLN
INTRAVENOUS | Status: AC
  Filled 2016-11-30: qty 2

## 2016-11-30 MED ORDER — MIDAZOLAM HCL 2 MG/2ML IJ SOLN
INTRAMUSCULAR | Status: AC
  Filled 2016-11-30: qty 2

## 2016-11-30 MED ORDER — LACTATED RINGERS IV SOLN
INTRAVENOUS | Status: DC | PRN
  Administered 2016-11-30: 16:00:00 via INTRAVENOUS

## 2016-11-30 MED ORDER — ONDANSETRON HCL 4 MG/2ML IJ SOLN
4.0000 mg | Freq: Once | INTRAMUSCULAR | Status: AC
  Administered 2016-11-30: 4 mg via INTRAVENOUS
  Filled 2016-11-30: qty 2

## 2016-11-30 MED ORDER — HYDROMORPHONE HCL-NACL 0.5-0.9 MG/ML-% IV SOSY
0.2500 mg | PREFILLED_SYRINGE | INTRAVENOUS | Status: DC | PRN
  Administered 2016-11-30: 0.25 mg via INTRAVENOUS

## 2016-11-30 MED ORDER — HYDROMORPHONE HCL 1 MG/ML IJ SOLN
1.0000 mg | INTRAMUSCULAR | Status: DC | PRN
  Administered 2016-11-30: 1 mg via INTRAVENOUS
  Filled 2016-11-30: qty 1

## 2016-11-30 MED ORDER — ROCURONIUM BROMIDE 50 MG/5ML IV SOSY
PREFILLED_SYRINGE | INTRAVENOUS | Status: AC
  Filled 2016-11-30: qty 5

## 2016-11-30 MED ORDER — BUPIVACAINE-EPINEPHRINE 0.25% -1:200000 IJ SOLN
INTRAMUSCULAR | Status: DC | PRN
  Administered 2016-11-30: 20 mL

## 2016-11-30 MED ORDER — PROPOFOL 10 MG/ML IV BOLUS
INTRAVENOUS | Status: AC
  Filled 2016-11-30: qty 20

## 2016-11-30 MED ORDER — MORPHINE SULFATE (PF) 4 MG/ML IV SOLN
4.0000 mg | Freq: Once | INTRAVENOUS | Status: AC
  Administered 2016-11-30: 4 mg via INTRAVENOUS
  Filled 2016-11-30: qty 1

## 2016-11-30 MED ORDER — 0.9 % SODIUM CHLORIDE (POUR BTL) OPTIME
TOPICAL | Status: DC | PRN
  Administered 2016-11-30: 1000 mL

## 2016-11-30 MED ORDER — FENTANYL CITRATE (PF) 250 MCG/5ML IJ SOLN
INTRAMUSCULAR | Status: AC
  Filled 2016-11-30: qty 5

## 2016-11-30 MED ORDER — DEXAMETHASONE SODIUM PHOSPHATE 10 MG/ML IJ SOLN
INTRAMUSCULAR | Status: DC | PRN
  Administered 2016-11-30: 10 mg via INTRAVENOUS

## 2016-11-30 MED ORDER — SUCCINYLCHOLINE CHLORIDE 200 MG/10ML IV SOSY
PREFILLED_SYRINGE | INTRAVENOUS | Status: AC
  Filled 2016-11-30: qty 10

## 2016-11-30 MED ORDER — SUCCINYLCHOLINE CHLORIDE 200 MG/10ML IV SOSY
PREFILLED_SYRINGE | INTRAVENOUS | Status: DC | PRN
  Administered 2016-11-30: 120 mg via INTRAVENOUS

## 2016-11-30 MED ORDER — PROPOFOL 10 MG/ML IV BOLUS
INTRAVENOUS | Status: DC | PRN
  Administered 2016-11-30: 180 mg via INTRAVENOUS

## 2016-11-30 MED ORDER — HYDRALAZINE HCL 20 MG/ML IJ SOLN
10.0000 mg | INTRAMUSCULAR | Status: DC | PRN
  Filled 2016-11-30: qty 0.5

## 2016-11-30 MED ORDER — HYDROCODONE-ACETAMINOPHEN 5-325 MG PO TABS
1.0000 | ORAL_TABLET | ORAL | Status: DC | PRN
  Administered 2016-11-30 – 2016-12-01 (×3): 2 via ORAL
  Filled 2016-11-30 (×3): qty 2

## 2016-11-30 MED ORDER — SCOPOLAMINE 1 MG/3DAYS TD PT72
MEDICATED_PATCH | TRANSDERMAL | Status: DC | PRN
  Administered 2016-11-30: 1 via TRANSDERMAL

## 2016-11-30 MED ORDER — SUGAMMADEX SODIUM 200 MG/2ML IV SOLN
INTRAVENOUS | Status: DC | PRN
  Administered 2016-11-30: 200 mg via INTRAVENOUS

## 2016-11-30 MED ORDER — IOPAMIDOL (ISOVUE-300) INJECTION 61%
100.0000 mL | Freq: Once | INTRAVENOUS | Status: AC | PRN
  Administered 2016-11-30: 100 mL via INTRAVENOUS

## 2016-11-30 MED ORDER — CEFTRIAXONE SODIUM 2 G IJ SOLR
2.0000 g | Freq: Once | INTRAMUSCULAR | Status: AC
  Administered 2016-11-30: 2 g via INTRAVENOUS
  Filled 2016-11-30: qty 2

## 2016-11-30 MED ORDER — ROCURONIUM BROMIDE 10 MG/ML (PF) SYRINGE
PREFILLED_SYRINGE | INTRAVENOUS | Status: DC | PRN
  Administered 2016-11-30: 20 mg via INTRAVENOUS

## 2016-11-30 MED ORDER — SODIUM CHLORIDE 0.9 % IV SOLN: INTRAVENOUS | Status: DC

## 2016-11-30 MED ORDER — HYDROMORPHONE HCL-NACL 0.5-0.9 MG/ML-% IV SOSY
PREFILLED_SYRINGE | INTRAVENOUS | Status: AC
  Filled 2016-11-30: qty 1

## 2016-11-30 MED ORDER — LIDOCAINE 2% (20 MG/ML) 5 ML SYRINGE
INTRAMUSCULAR | Status: AC
  Filled 2016-11-30: qty 5

## 2016-11-30 MED ORDER — MIDAZOLAM HCL 2 MG/2ML IJ SOLN
INTRAMUSCULAR | Status: DC | PRN
  Administered 2016-11-30: 2 mg via INTRAVENOUS

## 2016-11-30 SURGICAL SUPPLY — 54 items
ADH SKN CLS APL DERMABOND .7 (GAUZE/BANDAGES/DRESSINGS) ×1
APL SKNCLS STERI-STRIP NONHPOA (GAUZE/BANDAGES/DRESSINGS) ×2
APPLIER CLIP 5 13 M/L LIGAMAX5 (MISCELLANEOUS)
APPLIER CLIP ROT 10 11.4 M/L (STAPLE)
APR CLP MED LRG 11.4X10 (STAPLE)
APR CLP MED LRG 5 ANG JAW (MISCELLANEOUS)
BAG SPEC RTRVL 10 TROC 200 (ENDOMECHANICALS) ×1
BANDAGE ADH SHEER 1  50/CT (GAUZE/BANDAGES/DRESSINGS) ×15 IMPLANT
BENZOIN TINCTURE PRP APPL 2/3 (GAUZE/BANDAGES/DRESSINGS) ×6 IMPLANT
CABLE HIGH FREQUENCY MONO STRZ (ELECTRODE) ×3 IMPLANT
CLIP APPLIE 5 13 M/L LIGAMAX5 (MISCELLANEOUS) IMPLANT
CLIP APPLIE ROT 10 11.4 M/L (STAPLE) IMPLANT
CLIP LIGATING HEMO LOK XL GOLD (MISCELLANEOUS) ×2 IMPLANT
CLOSURE STERI-STRIP 1/4X4 (GAUZE/BANDAGES/DRESSINGS) ×3 IMPLANT
CLOSURE WOUND 1/2 X4 (GAUZE/BANDAGES/DRESSINGS) ×1
COVER SURGICAL LIGHT HANDLE (MISCELLANEOUS) ×3 IMPLANT
CUTTER FLEX LINEAR 45M (STAPLE) IMPLANT
DECANTER SPIKE VIAL GLASS SM (MISCELLANEOUS) ×3 IMPLANT
DERMABOND ADVANCED (GAUZE/BANDAGES/DRESSINGS) ×2
DERMABOND ADVANCED .7 DNX12 (GAUZE/BANDAGES/DRESSINGS) IMPLANT
DRAIN CHANNEL 19F RND (DRAIN) IMPLANT
DRAPE LAPAROSCOPIC ABDOMINAL (DRAPES) ×3 IMPLANT
ELECT REM PT RETURN 15FT ADLT (MISCELLANEOUS) ×3 IMPLANT
ENDOLOOP SUT PDS II  0 18 (SUTURE)
ENDOLOOP SUT PDS II 0 18 (SUTURE) IMPLANT
EVACUATOR SILICONE 100CC (DRAIN) IMPLANT
GLOVE BIOGEL PI IND STRL 7.0 (GLOVE) ×1 IMPLANT
GLOVE BIOGEL PI INDICATOR 7.0 (GLOVE) ×2
GLOVE SURG SS PI 7.0 STRL IVOR (GLOVE) ×3 IMPLANT
GOWN STRL REUS W/TWL LRG LVL3 (GOWN DISPOSABLE) ×3 IMPLANT
GOWN STRL REUS W/TWL XL LVL3 (GOWN DISPOSABLE) IMPLANT
GRASPER SUT TROCAR 14GX15 (MISCELLANEOUS) IMPLANT
IRRIG SUCT STRYKERFLOW 2 WTIP (MISCELLANEOUS) ×3
IRRIGATION SUCT STRKRFLW 2 WTP (MISCELLANEOUS) ×1 IMPLANT
KIT BASIN OR (CUSTOM PROCEDURE TRAY) ×3 IMPLANT
POUCH RETRIEVAL ECOSAC 10 (ENDOMECHANICALS) IMPLANT
POUCH RETRIEVAL ECOSAC 10MM (ENDOMECHANICALS) ×2
RELOAD 45 VASCULAR/THIN (ENDOMECHANICALS) IMPLANT
RELOAD STAPLE 45 2.5 WHT GRN (ENDOMECHANICALS) IMPLANT
RELOAD STAPLE 45 3.5 BLU ETS (ENDOMECHANICALS) IMPLANT
RELOAD STAPLE TA45 3.5 REG BLU (ENDOMECHANICALS) IMPLANT
SCISSORS LAP 5X35 DISP (ENDOMECHANICALS) ×3 IMPLANT
SHEARS HARMONIC ACE PLUS 36CM (ENDOMECHANICALS) IMPLANT
SLEEVE XCEL OPT CAN 5 100 (ENDOMECHANICALS) ×3 IMPLANT
STRIP CLOSURE SKIN 1/2X4 (GAUZE/BANDAGES/DRESSINGS) ×2 IMPLANT
SUT ETHILON 2 0 PS N (SUTURE) IMPLANT
SUT MNCRL AB 4-0 PS2 18 (SUTURE) ×3 IMPLANT
TOWEL OR 17X26 10 PK STRL BLUE (TOWEL DISPOSABLE) ×3 IMPLANT
TOWEL OR NON WOVEN STRL DISP B (DISPOSABLE) ×3 IMPLANT
TRAY LAPAROSCOPIC (CUSTOM PROCEDURE TRAY) ×3 IMPLANT
TROCAR BLADELESS OPT 5 100 (ENDOMECHANICALS) ×3 IMPLANT
TROCAR XCEL 12X100 BLDLESS (ENDOMECHANICALS) IMPLANT
TROCAR XCEL BLUNT TIP 100MML (ENDOMECHANICALS) IMPLANT
TUBING INSUF HEATED (TUBING) ×3 IMPLANT

## 2016-11-30 NOTE — Progress Notes (Signed)
Has slept soundly past first dose of Dilaudid, when awakens falls immediate back to sleep.

## 2016-11-30 NOTE — Anesthesia Postprocedure Evaluation (Signed)
Anesthesia Post Note  Patient: Renee Suarez  Procedure(s) Performed: Procedure(s) (LRB): APPENDECTOMY LAPAROSCOPIC (N/A)     Patient location during evaluation: PACU Anesthesia Type: General Level of consciousness: sedated Pain management: pain level controlled Vital Signs Assessment: post-procedure vital signs reviewed and stable Respiratory status: spontaneous breathing and respiratory function stable Cardiovascular status: stable Anesthetic complications: no    Last Vitals:  Vitals:   11/30/16 1800 11/30/16 1821  BP: (!) 92/55 (!) 107/55  Pulse: 64 76  Resp: 19 20  Temp: 36.5 C 36.6 C    Last Pain:  Vitals:   11/30/16 1830  TempSrc:   PainSc: 4                  Shontia Gillooly DANIEL

## 2016-11-30 NOTE — ED Notes (Signed)
She is drowsy and answers all questions appropriately. I monitor her while she is in CT; she tells me she feels much better. This is the first medication which seems to have helped her pain.

## 2016-11-30 NOTE — Anesthesia Procedure Notes (Signed)
Performed by: Minerva EndsMIRARCHI, Rina Adney M Oxygen Delivery Method: Simple face mask Placement Confirmation: breath sounds checked- equal and bilateral and positive ETCO2 Comments: Extubated --- face mask--- good AW to PACU VSS--- O2 intact

## 2016-11-30 NOTE — ED Notes (Signed)
We are unable to obtain blood culture # 2; however I start IV antibiotic.

## 2016-11-30 NOTE — Anesthesia Preprocedure Evaluation (Addendum)
Anesthesia Evaluation  Patient identified by MRN, date of birth, ID band Patient awake    Reviewed: Allergy & Precautions, NPO status , Patient's Chart, lab work & pertinent test results  History of Anesthesia Complications (+) PONVNegative for: history of anesthetic complications  Airway Mallampati: II  TM Distance: >3 FB Neck ROM: Full    Dental no notable dental hx. (+) Teeth Intact, Dental Advisory Given   Pulmonary former smoker,    Pulmonary exam normal breath sounds clear to auscultation       Cardiovascular hypertension, Normal cardiovascular exam Rhythm:Regular Rate:Normal     Neuro/Psych  Headaches, negative psych ROS   GI/Hepatic Neg liver ROS, GERD  Medicated and Controlled,  Endo/Other  Morbid obesity  Renal/GU Renal diseaseHx/o UPJ obstruction  negative genitourinary   Musculoskeletal   Abdominal (+) + obese,   Peds  Hematology  (+) Sickle cell trait and anemia ,   Anesthesia Other Findings   Reproductive/Obstetrics (+) Pregnancy Previous C/Section x 2 Hx/o Pre eclampsia with 1st pregnancy                            Anesthesia Physical  Anesthesia Plan  ASA: II and emergent  Anesthesia Plan: General   Post-op Pain Management:    Induction: Intravenous and Rapid sequence  PONV Risk Score and Plan: 4 or greater and Ondansetron, Dexamethasone, Scopolamine patch - Pre-op and Diphenhydramine  Airway Management Planned: Oral ETT  Additional Equipment:   Intra-op Plan:   Post-operative Plan: Extubation in OR  Informed Consent: I have reviewed the patients History and Physical, chart, labs and discussed the procedure including the risks, benefits and alternatives for the proposed anesthesia with the patient or authorized representative who has indicated his/her understanding and acceptance.   Dental advisory given  Plan Discussed with: Anesthesiologist, CRNA and  Surgeon  Anesthesia Plan Comments:         Anesthesia Quick Evaluation

## 2016-11-30 NOTE — ED Notes (Signed)
The surgeon is seeing her as I write this.

## 2016-11-30 NOTE — Op Note (Signed)
Preoperative diagnosis: acute appendicitis with peritonitis  Postoperative diagnosis: Same   Procedure: laparoscopic appendectomy  Surgeon: Feliciana RossettiLuke Kaedon Fanelli, M.D.  Asst: none  Anesthesia: Gen.   Indications for procedure: Renee Suarez is a 26 y.o. female with symptoms of vague pain  consistent with acute appendicitis. Confirmed by CT scan and laboratory values.  Description of procedure: The patient was brought into the operative suite, placed supine. Anesthesia was administered with endotracheal tube. The patient's left arm was tucked. All pressure points were offloaded by foam padding. The patient was prepped and draped in the usual sterile fashion.  The infraumbilical scar was reopened and a 5mm trocar used to gain access to the peritoneal cavity with optical entry technique. Pneumoperitoneum was applied with high flow low pressure.  2 5mm trocars were placed, one in the suprapubic space, one in the LLQ and the periumbilical tract was upsized to a 12mm trocar. All trocars sites were first anesthesized with 0.25% marcaine with epinephrine. Next the patient was placed in trendelenberg, rotated to the left. The omentum was retracted cephalad. The cecum and appendix were identified. The appendix was inflamed and there was some murky fluid in the area as well as fibrinous debris over the appendix. The base of the appendix was dissected and a window through the mesoappendix was created with blunt dissection. Large Hem-o-lock clips were used to doubly ligate the base of the appendix and mesoappendix. The appendix was cut free with scissors.  The appendix was placed in a specimen bag. The pelvis and RLQ were irrigated. Ovaries were identified and both tubal ligation clips were in good position The appendix was removed via the umbilicus. 0 vicryl was used to close the fascial defect. Pneumoperitoneum was removed, all trocars were removed. All incisions were closed with 4-0 monocryl subcuticular stitch.  The patient woke from anesthesia and was brought to PACU in stable condition.  Findings: acute appendicitis without perforation  Specimen: appendix  Blood loss: <30 ml  Local anesthesia: 20 ml 0.5% marcaine  Complications: none  Feliciana RossettiLuke Yelena Metzer, M.D. General, Bariatric, & Minimally Invasive Surgery [facility]

## 2016-11-30 NOTE — H&P (Signed)
Reason for Consult: appendicitis Referring Physician: Varney Biles  Renee Suarez is an 26 y.o. female.  HPI: 26 yo female with 1 day history of abdominal pain. Pain is constant. It is a twisting pain. She has not had any similar pains in the past. It is worse in her lower abdomen. She has nausea and vomiting actively.  Past Medical History:  Diagnosis Date  . Anemia   . Eclampsia    with 1st preg  . GERD (gastroesophageal reflux disease)    post partum 1st preg.  [name] Headache   . Hypertension   . Pregnancy induced hypertension   . Preterm delivery   . Preterm labor   . Sickle cell trait [facility])     Past Surgical History:  Procedure Laterality Date  . CESAREAN SECTION    . CESAREAN SECTION  04/25/2011   Procedure: CESAREAN SECTION;  Surgeon: Osborne Oman, MD;  Location: [facility];  Service: Gynecology;  Laterality: N/A;  Repeat C/Section  . CESAREAN SECTION WITH BILATERAL TUBAL LIGATION Bilateral 08/06/2016   Procedure: CESAREAN SECTION WITH BILATERAL TUBAL LIGATION;  Surgeon: Donnamae Jude, MD;  Location: [place];  Service: Obstetrics;  Laterality: Bilateral;  . CHOLECYSTECTOMY    . CHOLECYSTECTOMY  2010    Family History  Problem Relation Age of Onset  . Hypertension Paternal Grandmother   . Diabetes Paternal Grandmother   . Heart disease Paternal Grandmother   . Diabetes Mother   . Cancer Paternal Grandfather        pancreatic  . Mental illness Brother   . Mental retardation Brother     Social History:  reports that she has quit smoking. She has never used smokeless tobacco. She reports that she does not drink alcohol or use drugs.  Allergies:  Allergies  Allergen Reactions  . Iodine Solution [Povidone Iodine] Hives    Developed pin-point rash with itching where Betadine was used for C/S.    Medications: I have reviewed the patient's current medications.  Results for orders placed or performed during the hospital encounter of 11/30/16 (from the  past 48 hour(s))  Comprehensive metabolic panel     Status: Abnormal   Collection Time: 11/30/16 12:26 PM  Result Value Ref Range   Sodium 140 135 - 145 mmol/L   Potassium 3.6 3.5 - 5.1 mmol/L   Chloride 108 101 - 111 mmol/L   CO2 22 22 - 32 mmol/L   Glucose, Bld 103 (H) 65 - 99 mg/dL   BUN 10 6 - 20 mg/dL   Creatinine, Ser 0.62 0.44 - 1.00 mg/dL   Calcium 9.1 8.9 - 10.3 mg/dL   Total Protein 7.8 6.5 - 8.1 g/dL   Albumin 4.3 3.5 - 5.0 g/dL   AST 17 15 - 41 U/L   ALT 12 (L) 14 - 54 U/L   Alkaline Phosphatase 41 38 - 126 U/L   Total Bilirubin 0.8 0.3 - 1.2 mg/dL   GFR calc non Af Amer >60 >60 mL/min   GFR calc Af Amer >60 >60 mL/min    Comment: (NOTE) The eGFR has been calculated using the CKD EPI equation. This calculation has not been validated in all clinical situations. eGFR's persistently <60 mL/min signify possible Chronic Kidney Disease.    Anion gap 10 5 - 15  Lipase, blood     Status: None   Collection Time: 11/30/16 12:26 PM  Result Value Ref Range   Lipase 28 11 - 51 U/L  CBC with Diff  Status: Abnormal   Collection Time: 11/30/16 12:26 PM  Result Value Ref Range   WBC 12.9 (H) 4.0 - 10.5 K/uL   RBC 4.97 3.87 - 5.11 MIL/uL   Hemoglobin 13.0 12.0 - 15.0 g/dL   HCT 38.1 36.0 - 46.0 %   MCV 76.7 (L) 78.0 - 100.0 fL   MCH 26.2 26.0 - 34.0 pg   MCHC 34.1 30.0 - 36.0 g/dL   RDW 13.5 11.5 - 15.5 %   Platelets 168 150 - 400 K/uL   Neutrophils Relative % 89 %   Neutro Abs 11.5 (H) 1.7 - 7.7 K/uL   Lymphocytes Relative 8 %   Lymphs Abs 1.0 0.7 - 4.0 K/uL   Monocytes Relative 3 %   Monocytes Absolute 0.4 0.1 - 1.0 K/uL   Eosinophils Relative 0 %   Eosinophils Absolute 0.0 0.0 - 0.7 K/uL   Basophils Relative 0 %   Basophils Absolute 0.0 0.0 - 0.1 K/uL  I-Stat beta hCG blood, ED     Status: None   Collection Time: 11/30/16 12:34 PM  Result Value Ref Range   I-stat hCG, quantitative <5.0 <5 mIU/mL   Comment 3            Comment:   GEST. AGE      CONC.   (mIU/mL)   <=1 WEEK        5 - 50     2 WEEKS       50 - 500     3 WEEKS       100 - 10,000     4 WEEKS     1,000 - 30,000        FEMALE AND NON-PREGNANT FEMALE:     LESS THAN 5 mIU/mL   Urinalysis, Routine w reflex microscopic     Status: Abnormal   Collection Time: 11/30/16 12:46 PM  Result Value Ref Range   Color, Urine YELLOW YELLOW   APPearance CLEAR CLEAR   Specific Gravity, Urine 1.018 1.005 - 1.030   pH 6.0 5.0 - 8.0   Glucose, UA NEGATIVE NEGATIVE mg/dL   Hgb urine dipstick LARGE (A) NEGATIVE   Bilirubin Urine NEGATIVE NEGATIVE   Ketones, ur NEGATIVE NEGATIVE mg/dL   Protein, ur NEGATIVE NEGATIVE mg/dL   Nitrite NEGATIVE NEGATIVE   Leukocytes, UA NEGATIVE NEGATIVE   RBC / HPF 0-5 0 - 5 RBC/hpf   WBC, UA 0-5 0 - 5 WBC/hpf   Bacteria, UA MANY (A) NONE SEEN   Squamous Epithelial / LPF 0-5 (A) NONE SEEN   Mucous PRESENT   I-Stat CG4 Lactic Acid, ED     Status: None   Collection Time: 11/30/16  2:27 PM  Result Value Ref Range   Lactic Acid, Venous 1.54 0.5 - 1.9 mmol/L    Ct Abdomen Pelvis W Contrast  Result Date: 11/30/2016 CLINICAL DATA:  Abdominal pain.  Nausea and ileus vomiting. EXAM: CT ABDOMEN AND PELVIS WITH CONTRAST TECHNIQUE: Multidetector CT imaging of the abdomen and pelvis was performed using the standard protocol following bolus administration of intravenous contrast. CONTRAST:  128m ISOVUE-300 IOPAMIDOL (ISOVUE-300) INJECTION 61% COMPARISON:  None. FINDINGS: Lower chest: Minimal lateral costophrenic sulcus opacity favors atelectasis. Trace pleural fluid on the right. Hepatobiliary: No focal liver abnormality.Cholecystectomy. Mild prominence of the biliary tree attributed to reservoir effect given labs. Pancreas: Unremarkable. Spleen: Unremarkable. Adrenals/Urinary Tract: Negative adrenals. No hydronephrosis or stone. Unremarkable bladder. Stomach/Bowel: The appendix is collapsed within 1-2 cm of the cecum, followed by a stone  and then dilatation of the mid and  distal portion. The appendix measures up to 14 mm diameter. The appendix extends medial than inferior from the cecum. No perforation or necrosis noted. Vascular/Lymphatic: No acute vascular abnormality. No mass or adenopathy. Reproductive:No pathologic findings. Changes of tubal ligation. Tampon present. Other: Trace fluid in the low pelvis. No pneumoperitoneum. Scarring from Cesarean section. Musculoskeletal: No acute abnormalities. These results were called by telephone at the time of interpretation on 11/30/2016 at 1:37 pm to Dr. Varney Biles , who verbally acknowledged these results. IMPRESSION: Acute appendicitis, non-perforated. Electronically Signed   By: Monte Fantasia M.D.   On: 11/30/2016 13:39    Review of Systems  Constitutional: Negative for chills and fever.  HENT: Negative for hearing loss.   Eyes: Negative for blurred vision and double vision.  Respiratory: Negative for cough and hemoptysis.   Cardiovascular: Negative for chest pain and palpitations.  Gastrointestinal: Positive for abdominal pain, nausea and vomiting.  Genitourinary: Negative for dysuria and urgency.  Musculoskeletal: Negative for myalgias and neck pain.  Skin: Negative for itching and rash.  Neurological: Negative for dizziness, tingling and headaches.  Endo/Heme/Allergies: Does not bruise/bleed easily.  Psychiatric/Behavioral: Negative for depression and suicidal ideas.   Blood pressure 129/79, pulse (!) 54, temperature 97.7 F (36.5 C), temperature source Rectal, resp. rate 16, height 5' (1.524 m), weight 86.2 kg (190 lb), last menstrual period 11/26/2016, SpO2 100 %, currently breastfeeding. Physical Exam  Vitals reviewed. Constitutional: She is oriented to person, place, and time. She appears well-developed and well-nourished.  HENT:  Head: Normocephalic and atraumatic.  Eyes: Pupils are equal, round, and reactive to light. Conjunctivae and EOM are normal.  Neck: Normal range of motion. Neck supple.   Cardiovascular: Normal rate and regular rhythm.   Respiratory: Effort normal and breath sounds normal.  GI: Soft. Bowel sounds are normal. She exhibits no distension. There is tenderness in the right lower quadrant and suprapubic area.  Musculoskeletal: Normal range of motion.  Neurological: She is alert and oriented to person, place, and time.  Skin: Skin is warm and dry.  Psychiatric: She has a normal mood and affect. Her behavior is normal.      Assessment/Plan: 26 yo female with acute appendicitis. -we discussed the details of the procedure; that it would be done under general anesthesia, that we would attempt to do the procedure laparoscopically. That the appendix would be isolated from the large and small intestine and then ligated and removed. We discussed the reason for this is to avoid rupture and infection and resolve the pains. We discussed risks of infection, abscess, injury to intestines or urinary structures, and need for open incision. She showed good understanding and wanted to proceed. -lap appy -IV abx  Arta Bruce Kinsinger 11/30/2016, 3:18 PM

## 2016-11-30 NOTE — Anesthesia Procedure Notes (Signed)
Procedure Name: Intubation Date/Time: 11/30/2016 4:05 PM Performed by: Minerva EndsMIRARCHI, Laurinda Carreno M Pre-anesthesia Checklist: Patient identified, Emergency Drugs available, Suction available, Patient being monitored and Timeout performed Patient Re-evaluated:Patient Re-evaluated prior to induction Oxygen Delivery Method: Circle System Utilized Preoxygenation: Pre-oxygenation with 100% oxygen Induction Type: IV induction, Rapid sequence and Cricoid Pressure applied Laryngoscope Size: Miller and 2 Grade View: Grade I Tube type: Oral Number of attempts: 1 Airway Equipment and Method: Stylet Placement Confirmation: ETT inserted through vocal cords under direct vision,  positive ETCO2 and breath sounds checked- equal and bilateral Secured at: 21 cm Tube secured with: Tape Dental Injury: Teeth and Oropharynx as per pre-operative assessment  Comments: Smooth RSI Krista BlueSinger, Cricoid Krista BlueSinger--- intubation AM CRNA atraumatic--- teeth with irregular surfaces prior to intubation--- bilat BS Krista BlueSinger

## 2016-11-30 NOTE — Transfer of Care (Signed)
Immediate Anesthesia Transfer of Care Note  Patient: Renee Suarez  Procedure(s) Performed: Procedure(s): APPENDECTOMY LAPAROSCOPIC (N/A)  Patient Location: PACU  Anesthesia Type:General  Level of Consciousness: sedated  Airway & Oxygen Therapy: Patient Spontanous Breathing and Patient connected to face mask oxygen  Post-op Assessment: Report given to RN and Post -op Vital signs reviewed and stable  Post vital signs: Reviewed and stable  Last Vitals:  Vitals:   11/30/16 1516 11/30/16 1518  BP:    Pulse: 60 (!) 59  Resp:    Temp:      Last Pain:  Vitals:   11/30/16 1438  TempSrc:   PainSc: 6          Complications: No apparent anesthesia complications

## 2016-11-30 NOTE — ED Notes (Signed)
Her belongings are locked into locker #26 at this time.

## 2016-11-30 NOTE — ED Provider Notes (Addendum)
[facility]-EMERGENCY DEPT Provider Note   CSN: [id number] Arrival date & time: 11/30/16  1043     History   Chief Complaint Chief Complaint  Patient presents with  . Abdominal Pain    HPI Renee Suarez is a 26 y.o. female.  HPI Pt comes in with cc of epigastric pain. Pt has hx of GERD, cholecystectomy. She reports that she woke up with severe pain at 3 am. Pt has had constant, severe, non radiating sharp pain since then. Pt's pain is in the upper quadrants, worse in the epigastric region. There is associated nausea with bilious emesis x 5-10 times and 3 loose BM. Pt denies any suspicious po intake.   Past Medical History:  Diagnosis Date  . Anemia   . Eclampsia    with 1st preg  . GERD (gastroesophageal reflux disease)    post partum 1st preg.  [name]. Headache   . Hypertension   . Pregnancy induced hypertension   . Preterm delivery   . Preterm labor   . Sickle cell trait [facility](HCC)     Patient Active Problem List   Diagnosis Date Noted  . Postpartum care following cesarean delivery 08/06/2016  . Sickle cell trait (HCC) 01/30/2016  . History of cesarean delivery, antepartum 01/22/2016    Past Surgical History:  Procedure Laterality Date  . CESAREAN SECTION    . CESAREAN SECTION  04/25/2011   Procedure: CESAREAN SECTION;  Surgeon: [name], MD;  Location: [facility];  Service: Gynecology;  Laterality: N/A;  Repeat C/Section  . CESAREAN SECTION WITH BILATERAL TUBAL LIGATION Bilateral 08/06/2016   Procedure: CESAREAN SECTION WITH BILATERAL TUBAL LIGATION;  Surgeon: Reva Boresanya S Pratt, MD;  [facility];  Service: Obstetrics;  Laterality: Bilateral;  . CHOLECYSTECTOMY    . CHOLECYSTECTOMY  2010    OB History    Gravida Para Term Preterm AB Living   4 3 2 1 1 3    SAB TAB Ectopic Multiple Live Births   0 1 0 0 3       Home Medications    Prior to Admission medications   Medication Sig Start Date End Date Taking? Authorizing Provider  acetaminophen  (TYLENOL) 500 MG tablet Take 1,000 mg by mouth every 6 (six) hours as needed for mild pain or moderate pain.   Yes [provider]  docusate sodium (COLACE) 100 MG capsule Take 100 mg by mouth 2 (two) times daily.   Yes [provider]  ibuprofen (ADVIL,MOTRIN) 800 MG tablet Take 1 tablet (800 mg total) by mouth every 8 (eight) hours as needed. 08/25/16  Yes Brock BadHarper, Charles A, MD  nystatin cream (MYCOSTATIN) Apply to affected area 2 times daily 08/20/16  Yes Judeth HornLawrence, Erin, NP  Prenatal Vit-Fe Fumarate-FA (PREPLUS) 27-1 MG TABS Take 1 tablet by mouth daily. 04/17/16  Yes Hermina StaggersErvin, Michael L, MD  triamcinolone cream (KENALOG) 0.1 % Apply 1 application topically 2 (two) times daily. 08/20/16  Yes Judeth HornLawrence, Erin, NP  amoxicillin-clavulanate (AUGMENTIN) 875-125 MG tablet Take 1 tablet by mouth 2 (two) times daily. Patient not taking: Reported on 11/30/2016 08/25/16   Brock BadHarper, Charles A, MD  ferrous sulfate 325 (65 FE) MG tablet Take 1 tablet (325 mg total) by mouth daily with breakfast. Patient not taking: Reported on 11/30/2016 08/20/16   Judeth HornLawrence, Erin, NP    Family History Family History  Problem Relation Age of Onset  . Hypertension Paternal Grandmother   . Diabetes Paternal Grandmother   . Heart disease Paternal Grandmother   .  Diabetes Mother   . Cancer Paternal Grandfather        pancreatic  . Mental illness Brother   . Mental retardation Brother     Social History Social History  Substance Use Topics  . Smoking status: Former Games developer  . Smokeless tobacco: Never Used  . Alcohol use No     Allergies   Iodine solution [povidone iodine]   Review of Systems Review of Systems  Constitutional: Positive for activity change. Negative for fever.  Gastrointestinal: Positive for abdominal pain and nausea.  Genitourinary: Negative for dysuria, flank pain, frequency and hematuria.  Skin: Negative for rash.  All other systems reviewed and are negative.    Physical  Exam Updated Vital Signs BP 129/79 (BP Location: Left Arm)   Pulse (!) 54   Temp 97.7 F (36.5 C) (Rectal)   Resp 16   Ht 5' (1.524 m)   Wt 86.2 kg (190 lb)   LMP 11/26/2016   SpO2 100%   BMI 37.11 kg/m   Physical Exam  Constitutional: She is oriented to person, place, and time. She appears well-developed.  HENT:  Head: Normocephalic and atraumatic.  Eyes: EOM are normal.  Neck: Normal range of motion. Neck supple.  Cardiovascular: Normal rate.   Pulmonary/Chest: Effort normal.  Abdominal: Bowel sounds are normal. There is tenderness. There is guarding.  Soft, worst over the epigastric region and periumbilical region  Neurological: She is alert and oriented to person, place, and time.  Skin: Skin is warm and dry.  Nursing note and vitals reviewed.    ED Treatments / Results  Labs (all labs ordered are listed, but only abnormal results are displayed) Labs Reviewed  COMPREHENSIVE METABOLIC PANEL - Abnormal; Notable for the following:       Result Value   Glucose, Bld 103 (*)    ALT 12 (*)    All other components within normal limits  CBC WITH DIFFERENTIAL/PLATELET - Abnormal; Notable for the following:    WBC 12.9 (*)    MCV 76.7 (*)    Neutro Abs 11.5 (*)    All other components within normal limits  URINALYSIS, ROUTINE W REFLEX MICROSCOPIC - Abnormal; Notable for the following:    Hgb urine dipstick LARGE (*)    Bacteria, UA MANY (*)    Squamous Epithelial / LPF 0-5 (*)    All other components within normal limits  LIPASE, BLOOD  I-STAT BETA HCG BLOOD, ED ([facility], [facility], [facility] ONLY)  I-STAT CG4 LACTIC ACID, ED    EKG  EKG Interpretation None       Radiology Ct Abdomen Pelvis W Contrast  Result Date: 11/30/2016 CLINICAL DATA:  Abdominal pain.  Nausea and ileus vomiting. EXAM: CT ABDOMEN AND PELVIS WITH CONTRAST TECHNIQUE: Multidetector CT imaging of the abdomen and pelvis was performed using the standard protocol following bolus administration of intravenous  contrast. CONTRAST:  ISOVUE-300 IOPAMIDOL (ISOVUE-300) INJECTION 61% COMPARISON:  None. FINDINGS: Lower chest: Minimal lateral costophrenic sulcus opacity favors atelectasis. Trace pleural fluid on the right. Hepatobiliary: No focal liver abnormality.Cholecystectomy. Mild prominence of the biliary tree attributed to reservoir effect given labs. Pancreas: Unremarkable. Spleen: Unremarkable. Adrenals/Urinary Tract: Negative adrenals. No hydronephrosis or stone. Unremarkable bladder. Stomach/Bowel: The appendix is collapsed within 1-2 cm of the cecum, followed by a stone and then dilatation of the mid and distal portion. The appendix measures up to 14 mm diameter. The appendix extends medial than inferior from the cecum. No perforation or necrosis noted. Vascular/Lymphatic: No acute vascular  abnormality. No mass or adenopathy. Reproductive:No pathologic findings. Changes of tubal ligation. Tampon present. Other: Trace fluid in the low pelvis. No pneumoperitoneum. Scarring from Cesarean section. Musculoskeletal: No acute abnormalities. These results were called by telephone at the time of interpretation on 11/30/2016 at 1:37 pm to Dr. Derwood KaplanANKIT Amaree Loisel , who verbally acknowledged these results. IMPRESSION: Acute appendicitis, non-perforated. Electronically Signed   By: Marnee SpringJonathon  Watts M.D.   On: 11/30/2016 13:39    Procedures Procedures (including critical care time)  Medications Ordered in ED Medications  sodium chloride 0.9 % bolus 1,000 mL (0 mLs Intravenous Stopped 11/30/16 1323)    And  0.9 %  sodium chloride infusion (not administered)  HYDROmorphone (DILAUDID) injection 1 mg (1 mg Intravenous Given 11/30/16 1445)  iopamidol (ISOVUE-300) 61 % injection (not administered)  cefTRIAXone (ROCEPHIN) 2 g in dextrose 5 % 50 mL IVPB (2 g Intravenous New Bag/Given 11/30/16 1435)    And  metroNIDAZOLE (FLAGYL) IVPB 500 mg (not administered)  sodium chloride 0.9 % bolus 1,000 mL (1,000 mLs Intravenous New  Bag/Given 11/30/16 1411)  morphine 4 MG/ML injection 4 mg (4 mg Intravenous Given 11/30/16 1134)  ondansetron (ZOFRAN) injection 4 mg (4 mg Intravenous Given 11/30/16 1133)  HYDROmorphone (DILAUDID) injection 1 mg (1 mg Intravenous Given 11/30/16 1155)  ketamine 100 mg in normal saline 10 mL (10mg /mL) syringe (26 mg Intravenous Given 11/30/16 1305)  iopamidol (ISOVUE-300) 61 % injection 100 mL (100 mLs Intravenous Contrast Given 11/30/16 1312)     Initial Impression / Assessment and Plan / ED Course  I have reviewed the triage vital signs and the nursing notes.  Pertinent labs & imaging results that were available during my care of the patient were reviewed by me and considered in my medical decision making (see chart for details).  Clinical Course as of Dec 01 1450  Wynelle LinkSun Nov 30, 2016  1451 Ct ordered for persistent tenderness. Pain now worst in the lower quadrant. Patient has no pain with urination, blood in the urine, or frequent urination. Pt also denies any vaginal discharge or bleeding. CT results show acute appendicitis , surgery consulted.  [AN]    Clinical Course User Index [AN] Derwood KaplanNanavati, Shalamar Plourde, MD    DDx includes: Pancreatitis Hepatobiliary pathology including cholecystitis Gastritis/PUD SBO  Pt comes in with severe abd pain. Abd pain is new and generalized, epigastric and upper quadrants are the worst region. Pt has hx of cholecystectomy. She denies alcohol abuse. Mainly suspicious of SBO, choledocholithiasis and pancreatitis. Gastritis also considered. Symptoms control for now. We will reassess and get CT if needed.   Final Clinical Impressions(s) / ED Diagnoses   Final diagnoses:  Acute appendicitis with generalized peritonitis    New Prescriptions New Prescriptions   No medications on file     Derwood KaplanNanavati, Mackenzey Crownover, MD 11/30/16 1124    Derwood KaplanNanavati, Thaddius Manes, MD 11/30/16 1452

## 2016-11-30 NOTE — ED Triage Notes (Signed)
Pt with abdominal pain, nausea, vomiting. Pt c/o pain in R and LUQ.

## 2016-12-01 ENCOUNTER — Encounter (HOSPITAL_COMMUNITY): Payer: Self-pay | Admitting: General Surgery

## 2016-12-01 MED ORDER — IBUPROFEN 200 MG PO TABS
600.0000 mg | ORAL_TABLET | Freq: Three times a day (TID) | ORAL | Status: DC | PRN
  Administered 2016-12-01: 600 mg via ORAL
  Filled 2016-12-01: qty 3

## 2016-12-01 MED ORDER — ACETAMINOPHEN 500 MG PO TABS: 500.0000 mg | ORAL_TABLET | Freq: Three times a day (TID) | ORAL | 0 refills | Status: DC | PRN

## 2016-12-01 MED ORDER — HYDROCODONE-ACETAMINOPHEN 5-325 MG PO TABS
1.0000 | ORAL_TABLET | ORAL | Status: DC | PRN
  Administered 2016-12-01: 2 via ORAL
  Filled 2016-12-01: qty 2

## 2016-12-01 MED ORDER — HYDROCODONE-ACETAMINOPHEN 5-325 MG PO TABS: 1.0000 | ORAL_TABLET | Freq: Four times a day (QID) | ORAL | 0 refills | Status: DC | PRN

## 2016-12-01 MED ORDER — HYDROCODONE-ACETAMINOPHEN 5-325 MG PO TABS: 1.0000 | ORAL_TABLET | ORAL | 0 refills | Status: DC | PRN

## 2016-12-01 MED ORDER — IBUPROFEN 600 MG PO TABS: 600.0000 mg | ORAL_TABLET | Freq: Three times a day (TID) | ORAL | 0 refills | Status: DC | PRN

## 2016-12-01 NOTE — Progress Notes (Signed)
[facility] Progress Note  1 Day Post-Op  Subjective: CC:  Reports mild nausea this AM, only ate 1-2 bites applesauce and had to lay down. Pain improves temporarily with hydrocodone. Ambulated last night after surgery. Denies flatus/BM.  Objective: Vital signs in last 24 hours: Temp:  [97.5 F (36.4 C)-98.9 F (37.2 C)] 98.3 F (36.8 C) (08/06 0525) Pulse Rate:  [47-106] 68 (08/06 0525) Resp:  [16-22] 17 (08/06 0525) BP: (88-151)/(51-128) 113/51 (08/06 0525) SpO2:  [97 %-100 %] 100 % (08/06 0525) Weight:  [86.2 kg (190 lb)] 86.2 kg (190 lb) (08/05 1251) Last BM Date: 11/30/16  Intake/Output from previous day: 08/05 0701 - 08/06 0700 In: 2232.5 [P.O.:600; I.V.:1632.5] Out: 2175 [Urine:2150; Blood:25] Intake/Output this shift: No intake/output data recorded.  PE: Gen:  Alert, NAD, pleasant Card:  Regular rate and rhythm Pulm:  Normal effort Abd: Soft, appropriately tender, non-distended, bowel sounds present incisions C/D/I Skin: warm and dry, no rashes  Psych: A&Ox3   Lab Results:   Recent Labs  11/30/16 1226  WBC 12.9*  HGB 13.0  HCT 38.1  PLT 168   BMET  Recent Labs  11/30/16 1226  NA 140  K 3.6  CL 108  CO2 22  GLUCOSE 103*  BUN 10  CREATININE 0.62  CALCIUM 9.1   PT/INR No results for input(s): LABPROT, INR in the last 72 hours. CMP     Component Value Date/Time   NA 140 11/30/2016 1226   NA 136 01/22/2016 1642   K 3.6 11/30/2016 1226   CL 108 11/30/2016 1226   CO2 22 11/30/2016 1226   GLUCOSE 103 (H) 11/30/2016 1226   BUN 10 11/30/2016 1226   BUN 6 01/22/2016 1642   CREATININE 0.62 11/30/2016 1226   CREATININE 0.54 11/18/2010 1121   CALCIUM 9.1 11/30/2016 1226   PROT 7.8 11/30/2016 1226   PROT 7.2 01/22/2016 1642   ALBUMIN 4.3 11/30/2016 1226   ALBUMIN 4.3 01/22/2016 1642   AST 17 11/30/2016 1226   ALT 12 (L) 11/30/2016 1226   ALKPHOS 41 11/30/2016 1226   BILITOT 0.8 11/30/2016 1226   BILITOT 0.7 01/22/2016 1642   GFRNONAA >60 11/30/2016 1226   GFRAA >60 11/30/2016 1226   Lipase     Component Value Date/Time   LIPASE 28 11/30/2016 1226   Studies/Results: Ct Abdomen Pelvis W Contrast  Result Date: 11/30/2016 CLINICAL DATA:  Abdominal pain.  Nausea and ileus vomiting. EXAM: CT ABDOMEN AND PELVIS WITH CONTRAST TECHNIQUE: Multidetector CT imaging of the abdomen and pelvis was performed using the standard protocol following bolus administration of intravenous contrast. CONTRAST:  100mL ISOVUE-300 IOPAMIDOL (ISOVUE-300) INJECTION 61% COMPARISON:  None. FINDINGS: Lower chest: Minimal lateral costophrenic sulcus opacity favors atelectasis. Trace pleural fluid on the right. Hepatobiliary: No focal liver abnormality.Cholecystectomy. Mild prominence of the biliary tree attributed to reservoir effect given labs. Pancreas: Unremarkable. Spleen: Unremarkable. Adrenals/Urinary Tract: Negative adrenals. No hydronephrosis or stone. Unremarkable bladder. Stomach/Bowel: The appendix is collapsed within 1-2 cm of the cecum, followed by a stone and then dilatation of the mid and distal portion. The appendix measures up to 14 mm diameter. The appendix extends medial than inferior from the cecum. No perforation or necrosis noted. Vascular/Lymphatic: No acute vascular abnormality. No mass or adenopathy. Reproductive:No pathologic findings. Changes of tubal ligation. Tampon present. Other: Trace fluid in the low pelvis. No pneumoperitoneum. Scarring from Cesarean section. Musculoskeletal: No acute abnormalities. These results were called by telephone at the time of interpretation on 11/30/2016 at 1:37 pm  to Dr. Derwood Kaplan , who verbally acknowledged these results. IMPRESSION: Acute appendicitis, non-perforated. Electronically Signed   By: Marnee Spring M.D.   On: 11/30/2016 13:39    Anti-infectives: Anti-infectives    Start     Dose/Rate Route Frequency Ordered Stop   11/30/16 1345  cefTRIAXone (ROCEPHIN) 2 g in dextrose 5 % 50 mL  IVPB     2 g 100 mL/hr over 30 Minutes Intravenous  Once 11/30/16 1339 11/30/16 1505   11/30/16 1345  metroNIDAZOLE (FLAGYL) IVPB 500 mg     500 mg 100 mL/hr over 60 Minutes Intravenous  Once 11/30/16 1339 11/30/16 1612     Assessment/Plan Acute appendicitis POD#1 S/P laparoscopic appendectomy -afebrile, VSS - add ibuprofen for increased pain control - mild nausea this morning limiting ability to eat breakfast, plan to ambulate, eat lunch, and re-check this afternoon for possible discharge.   LOS: 0 days    Adam Phenix , [facility] 12/01/2016, 9:12 AM Pager: [phone] Consults: [phone] Mon-Fri 7:00 am-4:30 pm Sat-Sun 7:00 am-11:30 am

## 2016-12-01 NOTE — Discharge Instructions (Signed)

## 2016-12-01 NOTE — Discharge Summary (Signed)
[facility] Discharge Summary   Patient ID: Renee Suarez MRN: [id number] DOB/AGE: 28/05/1990 28 y.o.  Admit date: 11/30/2016 Discharge date: 12/01/2016  Discharge Diagnosis Patient Active Problem List   Diagnosis Date Noted  . Acute appendicitis 11/30/2016   Imaging: Ct Abdomen Pelvis W Contrast  Result Date: 11/30/2016 CLINICAL DATA:  Abdominal pain.  Nausea and ileus vomiting. EXAM: CT ABDOMEN AND PELVIS WITH CONTRAST TECHNIQUE: Multidetector CT imaging of the abdomen and pelvis was performed using the standard protocol following bolus administration of intravenous contrast. CONTRAST:  100mL ISOVUE-300 IOPAMIDOL (ISOVUE-300) INJECTION 61% COMPARISON:  None. FINDINGS: Lower chest: Minimal lateral costophrenic sulcus opacity favors atelectasis. Trace pleural fluid on the right. Hepatobiliary: No focal liver abnormality.Cholecystectomy. Mild prominence of the biliary tree attributed to reservoir effect given labs. Pancreas: Unremarkable. Spleen: Unremarkable. Adrenals/Urinary Tract: Negative adrenals. No hydronephrosis or stone. Unremarkable bladder. Stomach/Bowel: The appendix is collapsed within 1-2 cm of the cecum, followed by a stone and then dilatation of the mid and distal portion. The appendix measures up to 14 mm diameter. The appendix extends medial than inferior from the cecum. No perforation or necrosis noted. Vascular/Lymphatic: No acute vascular abnormality. No mass or adenopathy. Reproductive:No pathologic findings. Changes of tubal ligation. Tampon present. Other: Trace fluid in the low pelvis. No pneumoperitoneum. Scarring from Cesarean section. Musculoskeletal: No acute abnormalities. These results were called by telephone at the time of interpretation on 11/30/2016 at 1:37 pm to Dr. Derwood KaplanANKIT NANAVATI , who verbally acknowledged these results. IMPRESSION: Acute appendicitis, non-perforated. Electronically Signed   By: Marnee SpringJonathon  Watts M.D.   On: 11/30/2016 13:39    Procedures Dr. Franky MachoLuke Kinsinger (11/30/16) - Laparoscopic Appendectomy  Hospital Course:  26 year old female [facility] with one-day history of abdominal pain. Emergency room workup significant for acute appendicitis, CT scan above. She was admitted and underwent operation above by Dr. Sheliah HatchKinsinger which she tolerated well. On postop day #1 vitals were stable, pain controlled, tolerating oral intake, mobilizing, and medically stable for discharge home. She'll follow-up in our office in 2-3 weeks and knows to call with questions or concerns.  Allergies as of 12/01/2016      Reactions   Iodine Solution [povidone Iodine] Hives   Developed pin-point rash with itching where Betadine was used for C/S.      Medication List    STOP taking these medications   amoxicillin-clavulanate 875-125 MG tablet Commonly known as:  AUGMENTIN     TAKE these medications   acetaminophen 500 MG tablet Commonly known as:  TYLENOL Take 1 tablet (500 mg total) by mouth every 8 (eight) hours as needed for mild pain or moderate pain. What changed:  how much to take  when to take this   docusate sodium 100 MG capsule Commonly known as:  COLACE Take 100 mg by mouth 2 (two) times daily.   ferrous sulfate 325 (65 FE) MG tablet Take 1 tablet (325 mg total) by mouth daily with breakfast.   HYDROcodone-acetaminophen 5-325 MG tablet Commonly known as:  NORCO/VICODIN Take 1-2 tablets by mouth every 4 (four) hours as needed for moderate pain (if response to ibuprofen is not sufficient).   ibuprofen 800 MG tablet Commonly known as:  ADVIL,MOTRIN Take 1 tablet (800 mg total) by mouth every 8 (eight) hours as needed. What changed:  Another medication with the same name was added. Make sure you understand how and when to take each.   ibuprofen 600 MG tablet Commonly known as:  ADVIL,MOTRIN  Take 1 tablet (600 mg total) by mouth every 8 (eight) hours as needed for mild pain or  moderate pain. What changed:  You were already taking a medication with the same name, and this prescription was added. Make sure you understand how and when to take each.   nystatin cream Commonly known as:  MYCOSTATIN Apply to affected area 2 times daily   PREPLUS 27-1 MG Tabs Take 1 tablet by mouth daily.   triamcinolone cream 0.1 % Commonly known as:  KENALOG Apply 1 application topically 2 (two) times daily.        Follow-up Information    [facility] Follow up.   Specialty:  General Surgery Why:  call as soon as possible to confirm post-operative follow up appointment date/time. Contact information: [address] [place] [address] [phone]          Signed: Hosie Spangle, [facility] 12/01/2016, 2:28 PM Pager: [phone] Consults: [phone] Mon-Fri 7:00 am-4:30 pm Sat-Sun 7:00 am-11:30 am

## 2016-12-01 NOTE — Progress Notes (Signed)
Pt tolerated lunch with no complaints of nausea. Ambulated in hallway and tolerated that well.  D/C instructions and prescription was given.

## 2016-12-03 ENCOUNTER — Emergency Department (HOSPITAL_COMMUNITY)
Admission: EM | Admit: 2016-12-03 | Discharge: 2016-12-03 | Discharge status: Against Medical Advice | Payer: Medicaid Other | Attending: Emergency Medicine | Admitting: Emergency Medicine

## 2016-12-03 ENCOUNTER — Encounter (HOSPITAL_COMMUNITY): Payer: Self-pay | Admitting: Emergency Medicine

## 2016-12-03 DIAGNOSIS — L509 Urticaria, unspecified: Secondary | ICD-10-CM | POA: Diagnosis not present

## 2016-12-03 DIAGNOSIS — Z5321 Procedure and treatment not carried out due to patient leaving prior to being seen by health care provider: Secondary | ICD-10-CM | POA: Diagnosis not present

## 2016-12-03 NOTE — ED Notes (Signed)
Bed: WA08 Expected date:  Expected time:  Means of arrival:  Comments: 

## 2016-12-03 NOTE — ED Notes (Signed)
Patient called x 3 times to go to a room and no answer.

## 2016-12-03 NOTE — ED Triage Notes (Signed)
Patient reports that she recently had surgery and has pain medications and since yesterday started having hives on face, torso, genital area and itching.

## 2016-12-03 NOTE — ED Notes (Signed)
Pt called for v/s recheck no response, Pt called for room no response from lobby

## 2016-12-04 ENCOUNTER — Emergency Department (HOSPITAL_COMMUNITY)
Admission: EM | Admit: 2016-12-04 | Discharge: 2016-12-04 | Disposition: A | Discharge status: Home / Self Care | Payer: Medicaid Other | Attending: Emergency Medicine | Admitting: Emergency Medicine

## 2016-12-04 DIAGNOSIS — Z87891 Personal history of nicotine dependence: Secondary | ICD-10-CM | POA: Insufficient documentation

## 2016-12-04 DIAGNOSIS — Z9889 Other specified postprocedural states: Secondary | ICD-10-CM | POA: Insufficient documentation

## 2016-12-04 DIAGNOSIS — I10 Essential (primary) hypertension: Secondary | ICD-10-CM | POA: Diagnosis not present

## 2016-12-04 DIAGNOSIS — L509 Urticaria, unspecified: Secondary | ICD-10-CM | POA: Diagnosis not present

## 2016-12-04 DIAGNOSIS — D573 Sickle-cell trait: Secondary | ICD-10-CM | POA: Diagnosis not present

## 2016-12-04 LAB — CBC WITH DIFFERENTIAL/PLATELET
BASOS ABS: 0 10*3/uL (ref 0.0–0.1)
BASOS PCT: 0 %
EOS ABS: 0.8 10*3/uL — AB (ref 0.0–0.7)
EOS PCT: 13 %
HCT: 39.7 % (ref 36.0–46.0)
HEMOGLOBIN: 13.5 g/dL (ref 12.0–15.0)
Lymphocytes Relative: 38 %
Lymphs Abs: 2.4 10*3/uL (ref 0.7–4.0)
MCH: 25.8 pg — ABNORMAL LOW (ref 26.0–34.0)
MCHC: 34 g/dL (ref 30.0–36.0)
MCV: 75.8 fL — ABNORMAL LOW (ref 78.0–100.0)
Monocytes Absolute: 0.2 10*3/uL (ref 0.1–1.0)
Monocytes Relative: 3 %
NEUTROS PCT: 46 %
Neutro Abs: 2.9 10*3/uL (ref 1.7–7.7)
PLATELETS: 213 10*3/uL (ref 150–400)
RBC: 5.24 MIL/uL — AB (ref 3.87–5.11)
RDW: 13.3 % (ref 11.5–15.5)
WBC: 6.4 10*3/uL (ref 4.0–10.5)

## 2016-12-04 LAB — BASIC METABOLIC PANEL
Anion gap: 10 (ref 5–15)
BUN: 10 mg/dL (ref 6–20)
CHLORIDE: 107 mmol/L (ref 101–111)
CO2: 24 mmol/L (ref 22–32)
CREATININE: 0.72 mg/dL (ref 0.44–1.00)
Calcium: 9.6 mg/dL (ref 8.9–10.3)
GFR calc Af Amer: >60 mL/min (ref >60)
Glucose, Bld: 80 mg/dL (ref 65–99)
POTASSIUM: 3.4 mmol/L — AB (ref 3.5–5.1)
SODIUM: 141 mmol/L (ref 135–145)

## 2016-12-04 MED ORDER — PREDNISONE 10 MG PO TABS: 40.0000 mg | ORAL_TABLET | Freq: Every day | ORAL | 0 refills | Status: DC

## 2016-12-04 MED ORDER — IBUPROFEN 800 MG PO TABS: 800.0000 mg | ORAL_TABLET | Freq: Once | ORAL | Status: DC

## 2016-12-04 MED ORDER — HYDROMORPHONE HCL 1 MG/ML IJ SOLN
0.5000 mg | Freq: Once | INTRAMUSCULAR | Status: AC
  Administered 2016-12-04: 0.5 mg via INTRAVENOUS
  Filled 2016-12-04: qty 1

## 2016-12-04 MED ORDER — TRAMADOL HCL 50 MG PO TABS: 50.0000 mg | ORAL_TABLET | Freq: Four times a day (QID) | ORAL | 0 refills | Status: DC | PRN

## 2016-12-04 MED ORDER — METHYLPREDNISOLONE SODIUM SUCC 125 MG IJ SOLR
125.0000 mg | Freq: Once | INTRAMUSCULAR | Status: AC
  Administered 2016-12-04: 125 mg via INTRAVENOUS
  Filled 2016-12-04: qty 2

## 2016-12-04 MED ORDER — IBUPROFEN 200 MG PO TABS: 600.0000 mg | ORAL_TABLET | Freq: Once | ORAL | Status: DC

## 2016-12-04 MED ORDER — DIPHENHYDRAMINE HCL 25 MG PO CAPS
50.0000 mg | ORAL_CAPSULE | Freq: Once | ORAL | Status: DC
Start: 2016-12-04 — End: 2016-12-04

## 2016-12-04 MED ORDER — KETOROLAC TROMETHAMINE 30 MG/ML IJ SOLN
30.0000 mg | Freq: Once | INTRAMUSCULAR | Status: AC
  Administered 2016-12-04: 30 mg via INTRAVENOUS
  Filled 2016-12-04: qty 1

## 2016-12-04 MED ORDER — DIPHENHYDRAMINE HCL 50 MG/ML IJ SOLN
50.0000 mg | Freq: Once | INTRAMUSCULAR | Status: AC
  Administered 2016-12-04: 50 mg via INTRAVENOUS
  Filled 2016-12-04: qty 1

## 2016-12-04 MED ORDER — DIPHENHYDRAMINE HCL 25 MG PO TABS: 50.0000 mg | ORAL_TABLET | Freq: Four times a day (QID) | ORAL | 0 refills | Status: DC

## 2016-12-04 MED ORDER — FAMOTIDINE 20 MG PO TABS: 20.0000 mg | ORAL_TABLET | Freq: Once | ORAL | Status: DC

## 2016-12-04 MED ORDER — FAMOTIDINE 20 MG PO TABS: 20.0000 mg | ORAL_TABLET | Freq: Two times a day (BID) | ORAL | 0 refills | Status: DC

## 2016-12-04 NOTE — ED Triage Notes (Signed)
Pt states that she has been taking hydrocodone s/p appendectomy and now has had hives x 2 days. Taking benadryl at home. Alert and oriented. Airway intact.

## 2016-12-04 NOTE — ED Notes (Signed)
Pt's IV discharged at this time.

## 2016-12-04 NOTE — ED Provider Notes (Signed)
[facility]-EMERGENCY DEPT Provider Note   CSN: [id number] Arrival date & time: 12/04/16  0950     History   Chief Complaint Chief Complaint  Patient presents with  . Urticaria    HPI Renee Suarez is a 26 y.o. female.  The history is provided by the patient.  Urticaria  This is a new problem. The current episode started 2 days ago. The problem occurs constantly. Pertinent negatives include no chest pain, no abdominal pain, no headaches and no shortness of breath. Nothing aggravates the symptoms. Nothing relieves the symptoms.    26 year old who presents with urticaria. She is status post laparoscopic appendectomy on 11/30/2016. States that she has been taking hydrocodone for pain medication about 2 days ago began to have urticaria diffusely through her face and body. Denies any difficulty breathing or wheezing, syncope or near syncope, chest pain, nausea or vomiting. States that she stopped taking her hydrocodone 2 days ago but continues to have urticaria. Denies any other new medications. She did also report taking a bath and chlorhexidine prior to surgery, but no other new exposures such as lotions, creams, soaps, or foods.   Past Medical History:  Diagnosis Date  . Anemia   . Eclampsia    with 1st preg  . GERD (gastroesophageal reflux disease)    post partum 1st preg.  [name] Headache   . Hypertension   . Pregnancy induced hypertension   . Preterm delivery   . Preterm labor   . Sickle cell trait [facility])     Patient Active Problem List   Diagnosis Date Noted  . Acute appendicitis 11/30/2016  . Postpartum care following cesarean delivery 08/06/2016  . Sickle cell trait (HCC) 01/30/2016  . History of cesarean delivery, antepartum 01/22/2016    Past Surgical History:  Procedure Laterality Date  . APPENDECTOMY    . CESAREAN SECTION    . CESAREAN SECTION  04/25/2011   Procedure: CESAREAN SECTION;  Surgeon: Tereso Newcomer, MD;  Location: [facility] ORS;  Service: Gynecology;   Laterality: N/A;  Repeat C/Section  . CESAREAN SECTION WITH BILATERAL TUBAL LIGATION Bilateral 08/06/2016   Procedure: CESAREAN SECTION WITH BILATERAL TUBAL LIGATION;  Surgeon: Reva Bores, MD;  Location: [facility] BIRTHING SUITES;  Service: Obstetrics;  Laterality: Bilateral;  . CHOLECYSTECTOMY    . CHOLECYSTECTOMY  2010  . LAPAROSCOPIC APPENDECTOMY N/A 11/30/2016   Procedure: APPENDECTOMY LAPAROSCOPIC;  Surgeon: Kinsinger, De Blanch, MD;  Location: [facility] ORS;  Service: General;  Laterality: N/A;    OB History    Gravida Para Term Preterm AB Living   4 3 2 1 1 3    SAB TAB Ectopic Multiple Live Births   0 1 0 0 3       Home Medications    Prior to Admission medications   Medication Sig Start Date End Date Taking? Authorizing Provider  acetaminophen (TYLENOL) 500 MG tablet Take 1 tablet (500 mg total) by mouth every 8 (eight) hours as needed for mild pain or moderate pain. 12/01/16  Yes Adam Phenix, PA-C  HYDROcodone-acetaminophen (NORCO/VICODIN) 5-325 MG tablet Take 1-2 tablets by mouth every 4 (four) hours as needed for moderate pain (if response to ibuprofen is not sufficient). 12/01/16  Yes Simaan, Francine Graven, PA-C  ibuprofen (ADVIL,MOTRIN) 600 MG tablet Take 1 tablet (600 mg total) by mouth every 8 (eight) hours as needed for mild pain or moderate pain. 12/01/16  Yes Adam Phenix, PA-C  diphenhydrAMINE (BENADRYL) 25 MG tablet Take 2 tablets (50  mg total) by mouth every 6 (six) hours. 12/04/16   Lavera GuiseLiu, Giulian Goldring Duo, MD  famotidine (PEPCID) 20 MG tablet Take 1 tablet (20 mg total) by mouth 2 (two) times daily. 12/04/16   Lavera GuiseLiu, Alixandra Alfieri Duo, MD  ferrous sulfate 325 (65 FE) MG tablet Take 1 tablet (325 mg total) by mouth daily with breakfast. Patient not taking: Reported on 11/30/2016 08/20/16   Judeth HornLawrence, Erin, NP  ibuprofen (ADVIL,MOTRIN) 800 MG tablet Take 1 tablet (800 mg total) by mouth every 8 (eight) hours as needed. Patient not taking: Reported on 12/04/2016 08/25/16   Brock BadHarper, Charles A, MD    nystatin cream (MYCOSTATIN) Apply to affected area 2 times daily Patient not taking: Reported on 12/04/2016 08/20/16   Judeth HornLawrence, Erin, NP  predniSONE (DELTASONE) 10 MG tablet Take 4 tablets (40 mg total) by mouth daily. 12/04/16   Lavera GuiseLiu, Cyara Devoto Duo, MD  Prenatal Vit-Fe Fumarate-FA (PREPLUS) 27-1 MG TABS Take 1 tablet by mouth daily. Patient not taking: Reported on 12/04/2016 04/17/16   Hermina StaggersErvin, Michael L, MD  traMADol (ULTRAM) 50 MG tablet Take 1 tablet (50 mg total) by mouth every 6 (six) hours as needed. 12/04/16   Lavera GuiseLiu, Eraina Winnie Duo, MD  triamcinolone cream (KENALOG) 0.1 % Apply 1 application topically 2 (two) times daily. Patient not taking: Reported on 12/04/2016 08/20/16   Judeth HornLawrence, Erin, NP    Family History Family History  Problem Relation Age of Onset  . Hypertension Paternal Grandmother   . Diabetes Paternal Grandmother   . Heart disease Paternal Grandmother   . Diabetes Mother   . Cancer Paternal Grandfather        pancreatic  . Mental illness Brother   . Mental retardation Brother     Social History Social History  Substance Use Topics  . Smoking status: Former Games developermoker  . Smokeless tobacco: Never Used  . Alcohol use No     Allergies   Hydrocodone-acetaminophen and Iodine solution [povidone iodine]   Review of Systems Review of Systems  Respiratory: Negative for shortness of breath.   Cardiovascular: Negative for chest pain.  Gastrointestinal: Negative for abdominal pain.  Neurological: Negative for headaches.  All other systems reviewed and are negative.   Physical Exam Updated Vital Signs BP (!) 136/91   Pulse 69   Temp 98 F (36.7 C)   Resp 20   LMP 11/26/2016   SpO2 100%   Physical Exam Physical Exam  Nursing note and vitals reviewed. Constitutional: Well developed, well nourished, non-toxic, and in no acute distress Head: Normocephalic and atraumatic.  Mouth/Throat: Oropharynx is clear and moist.  Neck: Normal range of motion. Neck supple.  Cardiovascular:  Normal rate and regular rhythm.   Pulmonary/Chest: Effort normal and breath sounds normal.  Abdominal: Soft. There is no tenderness. There is no rebound and no guarding. laparoscopic surgical wound clean dry and in tact Musculoskeletal: Normal range of motion.  Neurological: Alert, no facial droop, fluent speech, moves all extremities symmetrically Skin: Skin is warm and dry. urticaria diffusely Psychiatric: Cooperative    ED Treatments / Results  Labs (all labs ordered are listed, but only abnormal results are displayed) Labs Reviewed  CBC WITH DIFFERENTIAL/PLATELET - Abnormal; Notable for the following:       Result Value   RBC 5.24 (*)    MCV 75.8 (*)    MCH 25.8 (*)    Eosinophils Absolute 0.8 (*)    All other components within normal limits  BASIC METABOLIC PANEL - Abnormal; Notable for the following:  Potassium 3.4 (*)    All other components within normal limits    EKG  EKG Interpretation None       Radiology No results found.  Procedures Procedures (including critical care time)  Medications Ordered in ED Medications  ketorolac (TORADOL) 30 MG/ML injection 30 mg (30 mg Intravenous Given 12/04/16 1142)  HYDROmorphone (DILAUDID) injection 0.5 mg (0.5 mg Intravenous Given 12/04/16 1146)  methylPREDNISolone sodium succinate (SOLU-MEDROL) 125 mg/2 mL injection 125 mg (125 mg Intravenous Given 12/04/16 1141)  diphenhydrAMINE (BENADRYL) injection 50 mg (50 mg Intravenous Given 12/04/16 1143)     Initial Impression / Assessment and Plan / ED Course  I have reviewed the triage vital signs and the nursing notes.  Pertinent labs & imaging results that were available during my care of the patient were reviewed by me and considered in my medical decision making (see chart for details).     Presenting with urticaria, likely related to hydrocodone as this is a new medication for her which she started in the hospital. She is nontoxic in no acute distress. Hemodynamically  stable. No respiratory distress. No concern sprain full axis. Did receive steroids, Benadryl, Pepcid. States that she has tolerated Dilaudid before in the past, and was given pain control with Toradol and Dilaudid. Symptoms are improving. We'll discharge home with steroids, Pepcid and Benadryl. Discussed strict return instructions. She requested oral Dilaudid for pain control at home given that she is now unable to take hydrocodone. We'll prescribe her tramadol instead. She will follow-up closely with her surgeon.  Final Clinical Impressions(s) / ED Diagnoses   Final diagnoses:  Urticaria    New Prescriptions New Prescriptions   DIPHENHYDRAMINE (BENADRYL) 25 MG TABLET    Take 2 tablets (50 mg total) by mouth every 6 (six) hours.   FAMOTIDINE (PEPCID) 20 MG TABLET    Take 1 tablet (20 mg total) by mouth 2 (two) times daily.   PREDNISONE (DELTASONE) 10 MG TABLET    Take 4 tablets (40 mg total) by mouth daily.   TRAMADOL (ULTRAM) 50 MG TABLET    Take 1 tablet (50 mg total) by mouth every 6 (six) hours as needed.     Lavera Guise, MD 12/04/16 1256

## 2016-12-04 NOTE — ED Notes (Signed)
Pt ambulatory to Bathroom

## 2016-12-04 NOTE — Discharge Instructions (Signed)
You have an allergic reaction to hydrocodone. Please discontinue this medication. Please take your other medications as prescribed.  If you need stronger pain medications please call your surgeon for follow-up.  Return for worsening symptoms including difficulty breathing, passing out, swelling of the tongue or throat, or any other symptoms concerning to you

## 2016-12-05 ENCOUNTER — Encounter (HOSPITAL_COMMUNITY): Payer: Self-pay | Admitting: General Surgery

## 2017-04-05 ENCOUNTER — Emergency Department (HOSPITAL_COMMUNITY)
Admission: EM | Admit: 2017-04-05 | Discharge: 2017-04-05 | Disposition: A | Discharge status: Home / Self Care | Payer: Medicaid Other | Attending: Emergency Medicine | Admitting: Emergency Medicine

## 2017-04-05 ENCOUNTER — Encounter (HOSPITAL_COMMUNITY): Payer: Self-pay | Admitting: Emergency Medicine

## 2017-04-05 DIAGNOSIS — I10 Essential (primary) hypertension: Secondary | ICD-10-CM | POA: Insufficient documentation

## 2017-04-05 DIAGNOSIS — Z87891 Personal history of nicotine dependence: Secondary | ICD-10-CM | POA: Diagnosis not present

## 2017-04-05 DIAGNOSIS — Z79899 Other long term (current) drug therapy: Secondary | ICD-10-CM | POA: Insufficient documentation

## 2017-04-05 DIAGNOSIS — Z733 Stress, not elsewhere classified: Secondary | ICD-10-CM | POA: Insufficient documentation

## 2017-04-05 DIAGNOSIS — F329 Major depressive disorder, single episode, unspecified: Secondary | ICD-10-CM | POA: Insufficient documentation

## 2017-04-05 DIAGNOSIS — Z885 Allergy status to narcotic agent status: Secondary | ICD-10-CM | POA: Diagnosis not present

## 2017-04-05 DIAGNOSIS — F32A Depression, unspecified: Secondary | ICD-10-CM

## 2017-04-05 NOTE — ED Notes (Signed)
Bed: WA26 Expected date:  Expected time:  Means of arrival:  Comments: SI 

## 2017-04-05 NOTE — Progress Notes (Signed)
Patient ID: Latina Cravershley N Glaeser, female   DOB: 04/26/1991, 26 y.o.   MRN: [id number]  Pt was seen and case discussed with treatment team and Dr Gilmore LarocheAkhtar. Pt stated she had and does not have any intentions of self harm. Pt stated she was arguing with her spouse and became overwhelmed and took one tablet of naproxen as a gesture for attention. Pt is remorseful and embarrassed by this and wants to go home and follow up with outpatient therapy. Pt stated she has multiple life stressors; she is sole bread winner, husband lost his job, Christmas coming, a child's birthday on 12-27 and three children 10, 6, and 7 months. Pt is more than willing to talk to a therapist and knows she needs help with coping skills. Pt is psychiatrically clear for discharge.    Laveda AbbeLaurie Britton Kasin Tonkinson, NP-C 04-04-2017     1237

## 2017-04-05 NOTE — Discharge Instructions (Signed)
If you have any thoughts of harming yourself or someone else call 911 immediately.  Call any of the numbers that you have been furnished to get counseling and help with depression

## 2017-04-05 NOTE — BHH Counselor (Signed)
Pt is cooperative and oriented x 4 when Clinical research associatewriter and NP laurie parks speak w/ pt. She reports she has been stressed lately d/t her husband's job loss and taking care of her 3 kids (7 mos, 6 & 10). Pt denies SI and reports she has no prior suicide attempts. She says there are no weapons in the home. Pt reports she is very interested in speaking with a therapist on a regular basis. Writer gives pt list of Medicaid approved MH outpatient treatment centers.   Evette Cristalaroline Paige Riccardo [place], [place] Therapeutic Triage Specialist
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
## 2017-06-02 ENCOUNTER — Emergency Department (HOSPITAL_COMMUNITY)
Admission: EM | Admit: 2017-06-02 | Discharge: 2017-06-02 | Discharge status: Against Medical Advice | Payer: Medicaid Other

## 2017-06-02 NOTE — ED Notes (Signed)
No answer from lobby  

## 2017-06-02 NOTE — ED Notes (Signed)
Pt did not answer when called

## 2017-06-03 ENCOUNTER — Emergency Department (HOSPITAL_COMMUNITY)
Admission: EM | Admit: 2017-06-03 | Discharge: 2017-06-03 | Disposition: A | Discharge status: Home / Self Care | Payer: Medicaid Other | Attending: Emergency Medicine | Admitting: Emergency Medicine

## 2017-06-03 ENCOUNTER — Encounter (HOSPITAL_COMMUNITY): Payer: Self-pay | Admitting: *Deleted

## 2017-06-03 DIAGNOSIS — R6889 Other general symptoms and signs: Secondary | ICD-10-CM

## 2017-06-03 DIAGNOSIS — R05 Cough: Secondary | ICD-10-CM | POA: Diagnosis present

## 2017-06-03 DIAGNOSIS — Z87891 Personal history of nicotine dependence: Secondary | ICD-10-CM | POA: Diagnosis not present

## 2017-06-03 DIAGNOSIS — J111 Influenza due to unidentified influenza virus with other respiratory manifestations: Secondary | ICD-10-CM | POA: Diagnosis not present

## 2017-06-03 DIAGNOSIS — I10 Essential (primary) hypertension: Secondary | ICD-10-CM | POA: Insufficient documentation

## 2017-06-03 DIAGNOSIS — Z79899 Other long term (current) drug therapy: Secondary | ICD-10-CM | POA: Insufficient documentation

## 2017-06-03 MED ORDER — OSELTAMIVIR PHOSPHATE 75 MG PO CAPS: 75.0000 mg | ORAL_CAPSULE | Freq: Two times a day (BID) | ORAL | 0 refills | Status: DC

## 2017-06-03 MED ORDER — IBUPROFEN 600 MG PO TABS: 600.0000 mg | ORAL_TABLET | Freq: Three times a day (TID) | ORAL | 0 refills | Status: DC | PRN

## 2017-06-03 NOTE — ED Notes (Signed)
See triage provider note and assessment

## 2017-06-03 NOTE — ED Triage Notes (Signed)
Pt reports chills fever, sore throat, abdominal pain diarhea for 2 days. Pt states that her children have had the flu.

## 2017-06-03 NOTE — ED Provider Notes (Signed)
[facility]   CSN: [id number] Arrival date & time: 06/03/17  1228     History   Chief Complaint Chief Complaint  Patient presents with  . Influenza    HPI Renee Suarez is a 27 y.o. female.  HPI   27 year old female with history of sickle cell trait, hypertension presenting for evaluation of flulike symptoms.  Patient report for the past 2 days she has had body aches, congestion, runny nose, sneezing, throat irritation, nonproductive cough and generalized weakness.  Symptoms felt very similar to her kids who was recently sick with the same and was diagnosed with influenza A.  She did not had a flu shot this year.  No report of nausea vomiting diarrhea or fever.  No urinary symptoms.  Has been taking TheraFlu with minimal relief.  Past Medical History:  Diagnosis Date  . Anemia   . Eclampsia    with 1st preg  . GERD (gastroesophageal reflux disease)    post partum 1st preg.  Renee Suarez. Headache   . Hypertension   . Pregnancy induced hypertension   . Preterm delivery   . Preterm labor   . Sickle cell trait [facility](HCC)     Patient Active Problem List   Diagnosis Date Noted  . Acute appendicitis 11/30/2016  . Postpartum care following cesarean delivery 08/06/2016  . Sickle cell trait (HCC) 01/30/2016  . History of cesarean delivery, antepartum 01/22/2016    Past Surgical History:  Procedure Laterality Date  . APPENDECTOMY    . CESAREAN SECTION    . CESAREAN SECTION  04/25/2011   Procedure: CESAREAN SECTION;  Surgeon: [name], MD;  Location: [facility] OR[facility];  Service: Gynecology;  Laterality: N/A;  Repeat C/Section  . CESAREAN SECTION WITH BILATERAL TUBAL LIGATION Bilateral 08/06/2016   Procedure: CESAREAN SECTION WITH BILATERAL TUBAL LIGATION;  Surgeon: Reva Boresanya S Pratt, MD;  [facility];  Service: Obstetrics;  Laterality: Bilateral;  . CHOLECYSTECTOMY    . CHOLECYSTECTOMY  2010  . LAPAROSCOPIC APPENDECTOMY N/A  11/30/2016   Procedure: APPENDECTOMY LAPAROSCOPIC;  Surgeon: Kinsinger, De BlanchLuke Aaron, MD;  Location: [facility];  Service: General;  Laterality: N/A;    OB History    Gravida Para Term Preterm AB Living   4 3 2 1 1 3    SAB TAB Ectopic Multiple Live Births   0 1 0 0 3       Home Medications    Prior to Admission medications   Medication Sig Start Date End Date Taking? Authorizing Provider  acetaminophen (TYLENOL) 500 MG tablet Take 1 tablet (500 mg total) by mouth every 8 (eight) hours as needed for mild pain or moderate pain. 12/01/16   Adam PhenixSimaan, Elizabeth S, PA-C  diphenhydrAMINE (BENADRYL) 25 MG tablet Take 2 tablets (50 mg total) by mouth every 6 (six) hours. 12/04/16   Lavera GuiseLiu, Dana Duo, MD  famotidine (PEPCID) 20 MG tablet Take 1 tablet (20 mg total) by mouth 2 (two) times daily. 12/04/16   Lavera GuiseLiu, Dana Duo, MD  ferrous sulfate 325 (65 FE) MG tablet Take 1 tablet (325 mg total) by mouth daily with breakfast. Patient not taking: Reported on 11/30/2016 08/20/16   Judeth HornLawrence, Erin, NP  HYDROcodone-acetaminophen (NORCO/VICODIN) 5-325 MG tablet Take 1-2 tablets by mouth every 4 (four) hours as needed for moderate pain (if response to ibuprofen is not sufficient). 12/01/16   Adam PhenixSimaan, Elizabeth S, PA-C  ibuprofen (ADVIL,MOTRIN) 600 MG tablet Take 1 tablet (600 mg total) by mouth every 8 (  eight) hours as needed for mild pain or moderate pain. 12/01/16   Adam Phenix, PA-C  ibuprofen (ADVIL,MOTRIN) 800 MG tablet Take 1 tablet (800 mg total) by mouth every 8 (eight) hours as needed. Patient not taking: Reported on 12/04/2016 08/25/16   Brock Bad, MD  nystatin cream (MYCOSTATIN) Apply to affected area 2 times daily Patient not taking: Reported on 12/04/2016 08/20/16   Judeth Horn, NP  predniSONE (DELTASONE) 10 MG tablet Take 4 tablets (40 mg total) by mouth daily. 12/04/16   Lavera Guise, MD  Prenatal Vit-Fe Fumarate-FA (PREPLUS) 27-1 MG TABS Take 1 tablet by mouth daily. Patient not taking: Reported on 12/04/2016  04/17/16   Hermina Staggers, MD  traMADol (ULTRAM) 50 MG tablet Take 1 tablet (50 mg total) by mouth every 6 (six) hours as needed. 12/04/16   Lavera Guise, MD  triamcinolone cream (KENALOG) 0.1 % Apply 1 application topically 2 (two) times daily. Patient not taking: Reported on 12/04/2016 08/20/16   Judeth Horn, NP    Family History Family History  Problem Relation Age of Onset  . Hypertension Paternal Grandmother   . Diabetes Paternal Grandmother   . Heart disease Paternal Grandmother   . Diabetes Mother   . Cancer Paternal Grandfather        pancreatic  . Mental illness Brother   . Mental retardation Brother     Social History Social History   Tobacco Use  . Smoking status: Former Games developer  . Smokeless tobacco: Never Used  Substance Use Topics  . Alcohol use: No  . Drug use: No     Allergies   Hydrocodone-acetaminophen and Iodine solution [povidone iodine]   Review of Systems Review of Systems  All other systems reviewed and are negative.    Physical Exam Updated Vital Signs BP 101/70 (BP Location: Right Arm)   Pulse 79   Temp 99.8 F (37.7 C) (Oral)   Resp 16   Ht 5' (1.524 m)   Wt 66.7 kg (147 lb)   LMP 05/13/2017   SpO2 100%   BMI 28.71 kg/m   Physical Exam  Constitutional: She is oriented to person, place, and time. She appears well-developed and well-nourished. No distress.  HENT:  Head: Atraumatic.  Right Ear: External ear normal.  Left Ear: External ear normal.  Nose: Nose normal.  Mouth/Throat: Oropharynx is clear and moist.  Eyes: Conjunctivae are normal.  Neck: Normal range of motion. Neck supple.  Cardiovascular: Normal rate and regular rhythm.  Pulmonary/Chest: Effort normal and breath sounds normal.  Abdominal: Soft. She exhibits no distension. There is no tenderness.  Neurological: She is alert and oriented to person, place, and time.  Skin: No rash noted.  Psychiatric: She has a normal mood and affect.  Nursing note and vitals  reviewed.    ED Treatments / Results  Labs (all labs ordered are listed, but only abnormal results are displayed) Labs Reviewed - No data to display  EKG  EKG Interpretation None       Radiology No results found.  Procedures Procedures (including critical care time)  Medications Ordered in ED Medications - No data to display   Initial Impression / Assessment and Plan / ED Course  I have reviewed the triage vital signs and the nursing notes.  Pertinent labs & imaging results that were available during my care of the patient were reviewed by me and considered in my medical decision making (see chart for details).     BP  101/70 (BP Location: Right Arm)   Pulse 79   Temp 99.8 F (37.7 C) (Oral)   Resp 16   Ht 5' (1.524 m)   Wt 66.7 kg (147 lb)   LMP 05/13/2017   SpO2 100%   BMI 28.71 kg/m    Final Clinical Impressions(s) / ED Diagnoses   Final diagnoses:  Flu-like symptoms    ED Discharge Orders    None     Patient with symptoms consistent with influenza.  Vitals are stable, low-grade fever.  No signs of dehydration, tolerating PO's.  Lungs are clear. Due to patient's presentation and physical exam a chest x-ray was not ordered bc likely diagnosis of flu.  Discussed the cost versus benefit of Tamiflu treatment with the patient.  The patient understands that symptoms are greater than the recommended 24-48 hour window of treatment.  Patient will be discharged with instructions to orally hydrate, rest, and use over-the-counter medications such as anti-inflammatories ibuprofen and Aleve for muscle aches and Tylenol for fever.  Patient will also be given a cough suppressant.     Fayrene Helper, PA-C 06/03/17 1256    Margarita Grizzle, MD 06/03/17 1323

## 2017-06-23 ENCOUNTER — Encounter (HOSPITAL_COMMUNITY): Payer: Self-pay | Admitting: Emergency Medicine

## 2017-06-23 ENCOUNTER — Emergency Department (HOSPITAL_COMMUNITY)
Admission: EM | Admit: 2017-06-23 | Discharge: 2017-06-23 | Disposition: A | Discharge status: Home / Self Care | Payer: Medicaid Other | Attending: Emergency Medicine | Admitting: Emergency Medicine

## 2017-06-23 ENCOUNTER — Other Ambulatory Visit: Payer: Self-pay

## 2017-06-23 DIAGNOSIS — Z9049 Acquired absence of other specified parts of digestive tract: Secondary | ICD-10-CM | POA: Insufficient documentation

## 2017-06-23 DIAGNOSIS — K644 Residual hemorrhoidal skin tags: Secondary | ICD-10-CM | POA: Diagnosis not present

## 2017-06-23 DIAGNOSIS — L918 Other hypertrophic disorders of the skin: Secondary | ICD-10-CM

## 2017-06-23 DIAGNOSIS — A630 Anogenital (venereal) warts: Secondary | ICD-10-CM | POA: Diagnosis not present

## 2017-06-23 DIAGNOSIS — Z79899 Other long term (current) drug therapy: Secondary | ICD-10-CM | POA: Diagnosis not present

## 2017-06-23 DIAGNOSIS — I10 Essential (primary) hypertension: Secondary | ICD-10-CM | POA: Diagnosis not present

## 2017-06-23 DIAGNOSIS — K6289 Other specified diseases of anus and rectum: Secondary | ICD-10-CM | POA: Diagnosis present

## 2017-06-23 DIAGNOSIS — Z87891 Personal history of nicotine dependence: Secondary | ICD-10-CM | POA: Insufficient documentation

## 2017-06-23 DIAGNOSIS — D573 Sickle-cell trait: Secondary | ICD-10-CM | POA: Insufficient documentation

## 2017-06-23 MED ORDER — HYDROCORTISONE 2.5 % RE CREA: TOPICAL_CREAM | RECTAL | 0 refills | Status: DC

## 2017-06-23 NOTE — ED Provider Notes (Signed)
[facility] EMERGENCY DEPARTMENT Provider Note   CSN: [id number] Arrival date & time: 06/23/17  1209     History   Chief Complaint Chief Complaint  Patient presents with  . Recurrent Skin Infections    HPI Renee Suarez is a 27 y.o. female with PMH/o sickle cell trait who presents for evaluation of skin abnormalities to the rectal area that has been ongoing for the last several months.  Patient reports that approximately 6 weeks ago, she noticed 2 small bumps to the perianal region.  Patient states that they have not grown in size but states that they have been irritating.  She states that they have not been painful but that they are irritating whenever she has a bowel movement and she has to wipe.  She has not noted any drainage from the areas.  Patient reports that she has not been evaluated by her primary care doctor for evaluation of symptoms.  Patient is currently sexually active with one partner.  She states that they do not engage in any anal intercourse.  Denies any fevers, surrounding redness or warmth, vaginal discharge, vaginal pain, vaginal rash, lesions.   The history is provided by the patient.    Past Medical History:  Diagnosis Date  . Anemia   . Eclampsia    with 1st preg  . GERD (gastroesophageal reflux disease)    post partum 1st preg.  [name] Headache   . Hypertension   . Pregnancy induced hypertension   . Preterm delivery   . Preterm labor   . Sickle cell trait [facility])     Patient Active Problem List   Diagnosis Date Noted  . Acute appendicitis 11/30/2016  . Postpartum care following cesarean delivery 08/06/2016  . Sickle cell trait (HCC) 01/30/2016  . History of cesarean delivery, antepartum 01/22/2016    Past Surgical History:  Procedure Laterality Date  . APPENDECTOMY    . CESAREAN SECTION    . CESAREAN SECTION  04/25/2011   Procedure: CESAREAN SECTION;  Surgeon: Tereso Newcomer, MD;  Location: [facility];  Service: Gynecology;   Laterality: N/A;  Repeat C/Section  . CESAREAN SECTION WITH BILATERAL TUBAL LIGATION Bilateral 08/06/2016   Procedure: CESAREAN SECTION WITH BILATERAL TUBAL LIGATION;  Surgeon: Reva Bores, MD;  Location: [facility] BIRTHING SUITES;  Service: Obstetrics;  Laterality: Bilateral;  . CHOLECYSTECTOMY    . CHOLECYSTECTOMY  2010  . LAPAROSCOPIC APPENDECTOMY N/A 11/30/2016   Procedure: APPENDECTOMY LAPAROSCOPIC;  Surgeon: Kinsinger, De Blanch, MD;  Location: [facility];  Service: General;  Laterality: N/A;    OB History    Gravida Para Term Preterm AB Living   4 3 2 1 1 3    SAB TAB Ectopic Multiple Live Births   0 1 0 0 3       Home Medications    Prior to Admission medications   Medication Sig Start Date End Date Taking? Authorizing Provider  acetaminophen (TYLENOL) 500 MG tablet Take 1 tablet (500 mg total) by mouth every 8 (eight) hours as needed for mild pain or moderate pain. 12/01/16   Adam Phenix, PA-C  diphenhydrAMINE (BENADRYL) 25 MG tablet Take 2 tablets (50 mg total) by mouth every 6 (six) hours. 12/04/16   Lavera Guise, MD  famotidine (PEPCID) 20 MG tablet Take 1 tablet (20 mg total) by mouth 2 (two) times daily. 12/04/16   Lavera Guise, MD  ferrous sulfate 325 (65 FE) MG tablet Take 1 tablet (325 mg total) by  mouth daily with breakfast. Patient not taking: Reported on 11/30/2016 08/20/16   Judeth HornLawrence, Erin, NP  HYDROcodone-acetaminophen (NORCO/VICODIN) 5-325 MG tablet Take 1-2 tablets by mouth every 4 (four) hours as needed for moderate pain (if response to ibuprofen is not sufficient). 12/01/16   Adam PhenixSimaan, Elizabeth S, PA-C  hydrocortisone (ANUSOL-HC) 2.5 % rectal cream Apply rectally 2 times daily 06/23/17   Graciella FreerLayden, Zuriel Yeaman A, PA-C  ibuprofen (ADVIL,MOTRIN) 600 MG tablet Take 1 tablet (600 mg total) by mouth every 8 (eight) hours as needed for mild pain or moderate pain. 06/03/17   Fayrene Helperran, Bowie, PA-C  nystatin cream (MYCOSTATIN) Apply to affected area 2 times daily Patient not taking: Reported on  12/04/2016 08/20/16   Judeth HornLawrence, Erin, NP  oseltamivir (TAMIFLU) 75 MG capsule Take 1 capsule (75 mg total) by mouth every 12 (twelve) hours. 06/03/17   Fayrene Helperran, Bowie, PA-C  predniSONE (DELTASONE) 10 MG tablet Take 4 tablets (40 mg total) by mouth daily. 12/04/16   Lavera GuiseLiu, Dana Duo, MD  Prenatal Vit-Fe Fumarate-FA (PREPLUS) 27-1 MG TABS Take 1 tablet by mouth daily. Patient not taking: Reported on 12/04/2016 04/17/16   Hermina StaggersErvin, Michael L, MD  traMADol (ULTRAM) 50 MG tablet Take 1 tablet (50 mg total) by mouth every 6 (six) hours as needed. 12/04/16   Lavera GuiseLiu, Dana Duo, MD  triamcinolone cream (KENALOG) 0.1 % Apply 1 application topically 2 (two) times daily. Patient not taking: Reported on 12/04/2016 08/20/16   Judeth HornLawrence, Erin, NP    Family History Family History  Problem Relation Age of Onset  . Hypertension Paternal Grandmother   . Diabetes Paternal Grandmother   . Heart disease Paternal Grandmother   . Diabetes Mother   . Cancer Paternal Grandfather        pancreatic  . Mental illness Brother   . Mental retardation Brother     Social History Social History   Tobacco Use  . Smoking status: Former Games developermoker  . Smokeless tobacco: Never Used  Substance Use Topics  . Alcohol use: No  . Drug use: No     Allergies   Hydrocodone-acetaminophen and Iodine solution [povidone iodine]   Review of Systems Review of Systems  Constitutional: Negative for fever.  Genitourinary: Negative for vaginal discharge and vaginal pain.  Skin: Positive for rash. Negative for color change.     Physical Exam Updated Vital Signs BP (!) 131/92 (BP Location: Right Arm)   Pulse 64   Temp 98.6 F (37 C) (Oral)   Resp 16   LMP 06/16/2017   SpO2 100%   Physical Exam  Constitutional: She appears well-developed and well-nourished.  Sitting comfortably on examination table  HENT:  Head: Normocephalic and atraumatic.  No oral lesions.   Eyes: Conjunctivae and EOM are normal. Right eye exhibits no discharge. Left eye  exhibits no discharge. No scleral icterus.  Pulmonary/Chest: Effort normal.  Genitourinary: Rectal exam shows no external hemorrhoid, no internal hemorrhoid and no mass.  Genitourinary Comments: The exam was performed with a chaperone present. Normal external female genitalia. No lesions, rash, or sores.  On rectal examination, patient has 2 separate small (<1 cm cauliflower-like lesions that are skin colored, 1 to the left perianal region and one to the right perianal region.  No surrounding warmth or erythema.  No mass, fluctuance, tenderness noted on digital rectal exam.  Neurological: She is alert.  Skin: Skin is warm and dry. Capillary refill takes less than 2 seconds.  No rash noted to palms or soles. She has a small 1cm  plantar wart noted to the right anterior forearm.   Psychiatric: She has a normal mood and affect. Her speech is normal and behavior is normal.  Nursing note and vitals reviewed.    ED Treatments / Results  Labs (all labs ordered are listed, but only abnormal results are displayed) Labs Reviewed - No data to display  EKG  EKG Interpretation None       Radiology No results found.  Procedures Procedures (including critical care time)  Medications Ordered in ED Medications - No data to display   Initial Impression / Assessment and Plan / ED Course  I have reviewed the triage vital signs and the nursing notes.  Pertinent labs & imaging results that were available during my care of the patient were reviewed by me and considered in my medical decision making (see chart for details).     27 year old female who presents for evaluation of skin abnormality to the perianal region that has been ongoing for several weeks.  He first noticed a small areas of abnormalities a few weeks ago.  Has not seen in follow-up.  No fevers.  Currently sexually active with one partner.  No history of STDs.  No vaginal complaints. Patient is afebrile, non-toxic appearing, sitting  comfortably on examination table. Vital signs reviewed and stable.  On rectal exam, patient has 2 small, less than 1 cm.  That are skin colored lesions, one noted to the left perianal region, one noted to the right perianal region.  They do appear to have some cauliflower-like characteristics noted that may be concerning for HPV.  Questionable skin tag versus condyloma acuminata.  No evidence of condyloma lata.  No hemorrhoid.  DRE is not concerning for perirectal abscess.  I discussed with patient regarding findings on physical exam.  Given that these do have some cauliflower-like appearance noted to them, I feel that this needs to be evaluated by OB/GYN as I cannot confirm this is HPV versus skin tags.  Patient has no oral lesions, lesions noted to the palms or soles of feet.  She does have a small plantar wart noted to the left upper extremity but otherwise denies any other skin abnormalities.  Do not suspect syphilis based on history/physical exam.  At this time, patient's biggest complaint seems to be the irritation that these areas cause.  She feels like they rub against her skin.  She denies any associated pain, warmth or erythema.  We will plan to give her some Anusol to help with slight irritation.  Instructed patient that she will need to follow-up with OB/GYN for further evaluation of these areas.  Vital signs stable and reviewed. Patient had ample opportunity for questions and discussion. All patient's questions were answered with full understanding. Strict return precautions discussed. Patient expresses understanding and agreement to plan.    Final Clinical Impressions(s) / ED Diagnoses   Final diagnoses:  Anogenital wart  Skin tag    ED Discharge Orders        Ordered    hydrocortisone (ANUSOL-HC) 2.5 % rectal cream     06/23/17 1800       Maxwell Caul, PA-C 06/24/17 0228    Melene Plan, DO 06/24/17 1506

## 2017-06-23 NOTE — Discharge Instructions (Signed)
As we discussed, there could be many possibilities for these areas and the exact nature of them cannot be determined in the emergency department.  This will require follow-up with your OB/GYN doctor to determine the exact cause of these warts.  Call and arrange for an appointment for further evaluation.  You can use the cream for any irritation that you have.  Patient not have any sexual intercourse until you are followed up by OB/GYN.  Return to the emergency department for any fever, warts or lesions of your mouth, vagina, vaginal discharge, vaginal bleeding, any other worsening or concerning symptoms.

## 2017-06-23 NOTE — ED Notes (Addendum)
Pt in A9 with family member waiting for an available room.

## 2017-06-23 NOTE — ED Triage Notes (Addendum)
Pt has boils on rectal area. Non draining at this time. Hx of such. RN looked at them and they present as wart like.

## 2017-06-24 ENCOUNTER — Other Ambulatory Visit: Payer: Self-pay

## 2017-06-24 ENCOUNTER — Encounter: Payer: Self-pay | Admitting: Obstetrics & Gynecology

## 2017-06-24 ENCOUNTER — Ambulatory Visit (INDEPENDENT_AMBULATORY_CARE_PROVIDER_SITE_OTHER): Payer: Medicaid Other | Admitting: Obstetrics & Gynecology

## 2017-06-24 VITALS — BP 109/75 | HR 84 | Wt 150.4 lb

## 2017-06-24 DIAGNOSIS — A630 Anogenital (venereal) warts: Secondary | ICD-10-CM | POA: Diagnosis not present

## 2017-06-24 DIAGNOSIS — R309 Painful micturition, unspecified: Secondary | ICD-10-CM | POA: Diagnosis not present

## 2017-06-24 LAB — POCT URINALYSIS DIPSTICK
Bilirubin, UA: NEGATIVE
GLUCOSE UA: NEGATIVE
Ketones, UA: NEGATIVE
LEUKOCYTES UA: NEGATIVE
Nitrite, UA: NEGATIVE
PROTEIN UA: NEGATIVE
RBC UA: NEGATIVE
Spec Grav, UA: 1.01 (ref 1.010–1.025)
Urobilinogen, UA: 0.2 E.U./dL
pH, UA: 5 (ref 5.0–8.0)

## 2017-06-24 MED ORDER — IMIQUIMOD 5 % EX CREA: TOPICAL_CREAM | CUTANEOUS | 4 refills | Status: DC

## 2017-06-24 NOTE — Progress Notes (Signed)
Rx changed to Dr. Clearance CootsHarper. Dr. Marice Potterove is not active with Medicaid.

## 2017-06-24 NOTE — Progress Notes (Signed)
Patient ID: Renee Suarez, female   DOB: [birth year], 27 y.o.   MRN: [id number]  Chief Complaint  Patient presents with  . GYN    HPI Renee Suarez is a 27 y.o. female. She is here because she would like a peri anal lesion evaluated. She went to the ER last night with the same issue and they suggested that she come here today. HPI  Past Medical History:  Diagnosis Date  . Anemia   . Eclampsia    with 1st preg  . GERD (gastroesophageal reflux disease)    post partum 1st preg.  [name] Headache   . Hypertension   . Pregnancy induced hypertension   . Preterm delivery   . Preterm labor   . Sickle cell trait [facility])     Past Surgical History:  Procedure Laterality Date  . APPENDECTOMY    . CESAREAN SECTION    . CESAREAN SECTION  04/25/2011   Procedure: CESAREAN SECTION;  Surgeon: Tereso Newcomer, MD;  Location: [facility];  Service: Gynecology;  Laterality: N/A;  Repeat C/Section  . CESAREAN SECTION WITH BILATERAL TUBAL LIGATION Bilateral 08/06/2016   Procedure: CESAREAN SECTION WITH BILATERAL TUBAL LIGATION;  Surgeon: Reva Bores, MD;  Location: [facility] BIRTHING SUIT[facility];  Service: Obstetrics;  Laterality: Bilateral;  . CHOLECYSTECTOMY    . CHOLECYSTECTOMY  2010  . LAPAROSCOPIC APPENDECTOMY N/A 11/30/2016   Procedure: APPENDECTOMY LAPAROSCOPIC;  Surgeon: Kinsinger, De Blanch, MD;  Location: [facility];  Service: General;  Laterality: N/A;    Family History  Problem Relation Age of Onset  . Hypertension Paternal Grandmother   . Diabetes Paternal Grandmother   . Heart disease Paternal Grandmother   . Diabetes Mother   . Cancer Paternal Grandfather        pancreatic  . Mental illness Brother   . Mental retardation Brother     Social History Social History   Tobacco Use  . Smoking status: Former Games developer  . Smokeless tobacco: Never Used  Substance Use Topics  . Alcohol use: No  . Drug use: No    Allergies  Allergen Reactions  . Hydrocodone-Acetaminophen Hives  . Iodine Solution  [Povidone Iodine] Hives    Developed pin-point rash with itching where Betadine was used for C/S.    Current Outpatient Medications  Medication Sig Dispense Refill  . oseltamivir (TAMIFLU) 75 MG capsule Take 1 capsule (75 mg total) by mouth every 12 (twelve) hours. 10 capsule 0  . acetaminophen (TYLENOL) 500 MG tablet Take 1 tablet (500 mg total) by mouth every 8 (eight) hours as needed for mild pain or moderate pain. (Patient not taking: Reported on 06/24/2017) 30 tablet 0  . diphenhydrAMINE (BENADRYL) 25 MG tablet Take 2 tablets (50 mg total) by mouth every 6 (six) hours. (Patient not taking: Reported on 06/24/2017) 20 tablet 0  . famotidine (PEPCID) 20 MG tablet Take 1 tablet (20 mg total) by mouth 2 (two) times daily. (Patient not taking: Reported on 06/24/2017) 30 tablet 0  . ferrous sulfate 325 (65 FE) MG tablet Take 1 tablet (325 mg total) by mouth daily with breakfast. (Patient not taking: Reported on 11/30/2016) 30 tablet 0  . HYDROcodone-acetaminophen (NORCO/VICODIN) 5-325 MG tablet Take 1-2 tablets by mouth every 4 (four) hours as needed for moderate pain (if response to ibuprofen is not sufficient). (Patient not taking: Reported on 06/24/2017) 30 tablet 0  . hydrocortisone (ANUSOL-HC) 2.5 % rectal cream Apply rectally 2 times daily (Patient not taking: Reported on 06/24/2017) 28.35 g  0  . ibuprofen (ADVIL,MOTRIN) 600 MG tablet Take 1 tablet (600 mg total) by mouth every 8 (eight) hours as needed for mild pain or moderate pain. (Patient not taking: Reported on 06/24/2017) 30 tablet 0  . nystatin cream (MYCOSTATIN) Apply to affected area 2 times daily (Patient not taking: Reported on 12/04/2016) 30 g 0  . predniSONE (DELTASONE) 10 MG tablet Take 4 tablets (40 mg total) by mouth daily. (Patient not taking: Reported on 06/24/2017) 20 tablet 0  . Prenatal Vit-Fe Fumarate-FA (PREPLUS) 27-1 MG TABS Take 1 tablet by mouth daily. (Patient not taking: Reported on 12/04/2016) 30 tablet 13  . traMADol (ULTRAM)  50 MG tablet Take 1 tablet (50 mg total) by mouth every 6 (six) hours as needed. (Patient not taking: Reported on 06/24/2017) 15 tablet 0  . triamcinolone cream (KENALOG) 0.1 % Apply 1 application topically 2 (two) times daily. (Patient not taking: Reported on 12/04/2016) 30 g 0   No current facility-administered medications for this visit.     Review of Systems Review of Systems  Blood pressure 109/75, pulse 84, weight 150 lb 6.4 oz (68.2 kg), last menstrual period 06/16/2017, currently breastfeeding.  Physical Exam Physical Exam  Breathing, conversing, and ambulating normally Well nourished, well hydrated Black female, no apparent distress 3 small perianal warts  Data Reviewed  Component 25yr ago  DIAGNOSIS: Comment   Comment: NEGATIVE FOR INTRAEPITHELIAL LESION AND MALIGNANCY.    Assessment    Perianal warts    Plan   aldara prescribed Come back 3 months She will find out if she has had Gardasil. If not, rec that she get it at her next visit.        Allie Bossier 06/24/2017, 11:26 AM

## 2017-06-24 NOTE — Addendum Note (Signed)
Addended by: Natale MilchSTALLING, BRITTANY D on: 06/24/2017 11:58 AM   Modules accepted: Orders

## 2017-06-26 LAB — URINE CULTURE

## 2017-06-29 ENCOUNTER — Ambulatory Visit (INDEPENDENT_AMBULATORY_CARE_PROVIDER_SITE_OTHER): Payer: Medicaid Other

## 2017-06-29 ENCOUNTER — Encounter: Payer: Self-pay | Admitting: *Deleted

## 2017-06-29 ENCOUNTER — Telehealth: Payer: Self-pay

## 2017-06-29 DIAGNOSIS — Z7185 Encounter for immunization safety counseling: Secondary | ICD-10-CM

## 2017-06-29 DIAGNOSIS — Z23 Encounter for immunization: Secondary | ICD-10-CM

## 2017-06-29 DIAGNOSIS — Z7189 Other specified counseling: Secondary | ICD-10-CM

## 2017-06-29 MED ORDER — CIPROFLOXACIN HCL 500 MG PO TABS: 500.0000 mg | ORAL_TABLET | Freq: Two times a day (BID) | ORAL | 0 refills | Status: DC

## 2017-06-29 NOTE — Telephone Encounter (Signed)
Pt aware of test results. Allergies verified. Cipro 500 mg BID x 7 days sent to [facility]. Pt agrees and has no further questions.

## 2017-06-29 NOTE — Telephone Encounter (Signed)
-----   Message from Allie BossierMyra C Dove, MD sent at 06/29/2017  8:17 AM EST ----- She does not have MyChart so could you please let her know that she has a UTI. Please call her in cipro 500 mg BID for 7 days. Thanks

## 2017-06-29 NOTE — Progress Notes (Signed)
Patient is in the office for Gardasil injection, administered and pt tolerated well.

## 2017-06-29 NOTE — Progress Notes (Signed)
I have reviewed the chart and agree with nursing staff's documentation of this patient's encounter.  Alvilda Mckenna, MD 06/29/2017 1:15 PM    

## 2017-08-26 ENCOUNTER — Ambulatory Visit (INDEPENDENT_AMBULATORY_CARE_PROVIDER_SITE_OTHER): Payer: Medicaid Other

## 2017-08-26 ENCOUNTER — Ambulatory Visit: Payer: Medicaid Other

## 2017-08-26 DIAGNOSIS — Z23 Encounter for immunization: Secondary | ICD-10-CM

## 2017-08-26 NOTE — Progress Notes (Signed)
Pt here for her 2nd gardasil inj. Inj given in right deltoid. Pt tolerated well. Pt advised to schedule appt for 4 months for her last inj.

## 2017-08-27 NOTE — Progress Notes (Signed)
Agree with A & P. 

## 2017-09-01 ENCOUNTER — Ambulatory Visit: Payer: Self-pay | Admitting: Obstetrics

## 2017-09-09 ENCOUNTER — Other Ambulatory Visit: Payer: Self-pay

## 2017-09-09 ENCOUNTER — Emergency Department (HOSPITAL_COMMUNITY)
Admission: EM | Admit: 2017-09-09 | Discharge: 2017-09-09 | Disposition: A | Discharge status: Home / Self Care | Payer: Medicaid Other | Attending: Emergency Medicine | Admitting: Emergency Medicine

## 2017-09-09 ENCOUNTER — Encounter (HOSPITAL_COMMUNITY): Payer: Self-pay

## 2017-09-09 ENCOUNTER — Emergency Department (HOSPITAL_COMMUNITY): Payer: Medicaid Other

## 2017-09-09 DIAGNOSIS — Y9389 Activity, other specified: Secondary | ICD-10-CM | POA: Diagnosis not present

## 2017-09-09 DIAGNOSIS — Y999 Unspecified external cause status: Secondary | ICD-10-CM | POA: Diagnosis not present

## 2017-09-09 DIAGNOSIS — Z79899 Other long term (current) drug therapy: Secondary | ICD-10-CM | POA: Insufficient documentation

## 2017-09-09 DIAGNOSIS — X501XXA Overexertion from prolonged static or awkward postures, initial encounter: Secondary | ICD-10-CM | POA: Insufficient documentation

## 2017-09-09 DIAGNOSIS — S93401A Sprain of unspecified ligament of right ankle, initial encounter: Secondary | ICD-10-CM | POA: Diagnosis not present

## 2017-09-09 DIAGNOSIS — Y929 Unspecified place or not applicable: Secondary | ICD-10-CM | POA: Insufficient documentation

## 2017-09-09 DIAGNOSIS — I10 Essential (primary) hypertension: Secondary | ICD-10-CM | POA: Insufficient documentation

## 2017-09-09 DIAGNOSIS — S99911A Unspecified injury of right ankle, initial encounter: Secondary | ICD-10-CM | POA: Diagnosis present

## 2017-09-09 NOTE — ED Triage Notes (Signed)
Pt states she was running yesterday and twisted her right ankle. Pt reports using heat and ice packs without relief. Some swelling noted. Pt ambulatory with a limp.

## 2017-09-09 NOTE — Progress Notes (Signed)
Orthopedic Tech Progress Note Patient Details:  Renee Suarez 1990/06/21 [id number]  Ortho Devices Type of Ortho Device: Crutches, ASO Ortho Device/Splint Location: rle Ortho Device/Splint Interventions: Application   Post Interventions Patient Tolerated: Well Instructions Provided: Care of device   Nikki Dom 09/09/2017, 10:07 AM

## 2017-09-09 NOTE — ED Provider Notes (Signed)
[facility] EMERGENCY DEPARTMENT Provider Note   CSN: [id number] Arrival date & time: 09/09/17  0815     History   Chief Complaint Chief Complaint  Patient presents with  . Ankle Pain    HPI Renee Suarez is a 27 y.o. female.  HPI   Pt is a 27 y/o female who presents to the ED c/o right ankle pain that began suddenly yesterday.  Patient states she was walking and playing with her kids when she accidentally twisted her ankle inward.  She had sudden onset of severe pain that is worse with ambulation.  She states pain has improved to 8-9/10 today.  She tried cold packs and ice packs yesterday.  She has not taken any medication for her symptoms.  She denies any other injuries.  No numbness or weakness to the foot.  No bilateral knee pain.  No other symptoms.  Past Medical History:  Diagnosis Date  . Anemia   . Eclampsia    with 1st preg  . GERD (gastroesophageal reflux disease)    post partum 1st preg.  [name] Headache   . Hypertension   . Pregnancy induced hypertension   . Preterm delivery   . Preterm labor   . Sickle cell trait [facility])     Patient Active Problem List   Diagnosis Date Noted  . Acute appendicitis 11/30/2016  . Postpartum care following cesarean delivery 08/06/2016  . Sickle cell trait (HCC) 01/30/2016  . History of cesarean delivery, antepartum 01/22/2016    Past Surgical History:  Procedure Laterality Date  . APPENDECTOMY    . CESAREAN SECTION    . CESAREAN SECTION  04/25/2011   Procedure: CESAREAN SECTION;  Surgeon: Tereso Newcomer, MD;  Location: [facility] ORS;  Service: Gynecology;  Laterality: N/A;  Repeat C/Section  . CESAREAN SECTION WITH BILATERAL TUBAL LIGATION Bilateral 08/06/2016   Procedure: CESAREAN SECTION WITH BILATERAL TUBAL LIGATION;  Surgeon: Reva Bores, MD;  Location: [facility] BIRTHING SUITES;  Service: Obstetrics;  Laterality: Bilateral;  . CHOLECYSTECTOMY    . CHOLECYSTECTOMY  2010  . LAPAROSCOPIC APPENDECTOMY N/A 11/30/2016     Procedure: APPENDECTOMY LAPAROSCOPIC;  Surgeon: Rodman Pickle, MD;  Location: [facility] ORS;  Service: General;  Laterality: N/A;     OB History    Gravida  4   Para  3   Term  2   Preterm  1   AB  1   Living  3     SAB  0   TAB  1   Ectopic  0   Multiple  0   Live Births  3            Home Medications    Prior to Admission medications   Medication Sig Start Date End Date Taking? Authorizing Provider  acetaminophen (TYLENOL) 500 MG tablet Take 1 tablet (500 mg total) by mouth every 8 (eight) hours as needed for mild pain or moderate pain. Patient not taking: Reported on 06/24/2017 12/01/16   Adam Phenix, PA-C  ciprofloxacin (CIPRO) 500 MG tablet Take 1 tablet (500 mg total) by mouth 2 (two) times daily. 06/29/17   Allie Bossier, MD  diphenhydrAMINE (BENADRYL) 25 MG tablet Take 2 tablets (50 mg total) by mouth every 6 (six) hours. Patient not taking: Reported on 06/24/2017 12/04/16   Lavera Guise, MD  famotidine (PEPCID) 20 MG tablet Take 1 tablet (20 mg total) by mouth 2 (two) times daily. Patient not taking: Reported on 06/24/2017  12/04/16   Lavera Guise, MD  ferrous sulfate 325 (65 FE) MG tablet Take 1 tablet (325 mg total) by mouth daily with breakfast. Patient not taking: Reported on 11/30/2016 08/20/16   Judeth Horn, NP  HYDROcodone-acetaminophen (NORCO/VICODIN) 5-325 MG tablet Take 1-2 tablets by mouth every 4 (four) hours as needed for moderate pain (if response to ibuprofen is not sufficient). Patient not taking: Reported on 06/24/2017 12/01/16   Adam Phenix, PA-C  hydrocortisone (ANUSOL-HC) 2.5 % rectal cream Apply rectally 2 times daily Patient not taking: Reported on 06/24/2017 06/23/17   Maxwell Caul, PA-C  ibuprofen (ADVIL,MOTRIN) 600 MG tablet Take 1 tablet (600 mg total) by mouth every 8 (eight) hours as needed for mild pain or moderate pain. Patient not taking: Reported on 06/24/2017 06/03/17   Fayrene Helper, PA-C  imiquimod Mathis Dad) 5 % cream  Apply topically 3 (three) times a week. 06/24/17   Brock Bad, MD  nystatin cream (MYCOSTATIN) Apply to affected area 2 times daily Patient not taking: Reported on 12/04/2016 08/20/16   Judeth Horn, NP  oseltamivir (TAMIFLU) 75 MG capsule Take 1 capsule (75 mg total) by mouth every 12 (twelve) hours. 06/03/17   Fayrene Helper, PA-C  predniSONE (DELTASONE) 10 MG tablet Take 4 tablets (40 mg total) by mouth daily. Patient not taking: Reported on 06/24/2017 12/04/16   Lavera Guise, MD  Prenatal Vit-Fe Fumarate-FA (PREPLUS) 27-1 MG TABS Take 1 tablet by mouth daily. Patient not taking: Reported on 12/04/2016 04/17/16   Hermina Staggers, MD  traMADol (ULTRAM) 50 MG tablet Take 1 tablet (50 mg total) by mouth every 6 (six) hours as needed. Patient not taking: Reported on 06/24/2017 12/04/16   Lavera Guise, MD  triamcinolone cream (KENALOG) 0.1 % Apply 1 application topically 2 (two) times daily. Patient not taking: Reported on 12/04/2016 08/20/16   Judeth Horn, NP    Family History Family History  Problem Relation Age of Onset  . Hypertension Paternal Grandmother   . Diabetes Paternal Grandmother   . Heart disease Paternal Grandmother   . Diabetes Mother   . Cancer Paternal Grandfather        pancreatic  . Mental illness Brother   . Mental retardation Brother     Social History Social History   Tobacco Use  . Smoking status: Former Games developer  . Smokeless tobacco: Never Used  Substance Use Topics  . Alcohol use: No  . Drug use: No    Types: Marijuana     Allergies   Hydrocodone-acetaminophen and Iodine solution [povidone iodine]   Review of Systems Review of Systems  Musculoskeletal:       Right ankle pain  Neurological: Negative for weakness and numbness.     Physical Exam Updated Vital Signs BP 98/68 (BP Location: Right Arm)   Pulse 67   Temp 98.4 F (36.9 C) (Oral)   Resp 16   LMP 07/29/2017 (Within Days)   SpO2 100%   Physical Exam  Constitutional: She appears  well-developed and well-nourished. No distress.  HENT:  Head: Normocephalic and atraumatic.  Eyes: Conjunctivae are normal.  Neck: Neck supple.  Cardiovascular: Normal rate.  Pulmonary/Chest: Effort normal.  Musculoskeletal: Normal range of motion.  Patient has 5 out of 5 strength with knee extension, flexion, dorsiflexion/plantarflexion bilaterally.  Able to wiggle toes bilaterally.  Brisk cap refill.  Normal sensation bilaterally.  Distal pulses intact bilaterally.  No tenderness to the medial malleolus.  She has tenderness to the inferior portion of  the lateral malleolus with no obvious step-off or deformity to the distal fibula.  No tenderness to the midfoot or hindfoot.  She is able to ambulate.  Neurological: She is alert.  Skin: Skin is warm and dry.  Psychiatric: She has a normal mood and affect.  Nursing note and vitals reviewed.    ED Treatments / Results  Labs (all labs ordered are listed, but only abnormal results are displayed) Labs Reviewed - No data to display  EKG None  Radiology Dg Ankle Complete Right  Result Date: 09/09/2017 CLINICAL DATA:  Right ankle pain after injury running yesterday. EXAM: RIGHT ANKLE - COMPLETE 3+ VIEW COMPARISON:  None. FINDINGS: There is no evidence of fracture, dislocation, or joint effusion. There is no evidence of arthropathy or other focal bone abnormality. Soft tissues are unremarkable. IMPRESSION: Normal right ankle. Electronically Signed   By: Lupita Raider, M.D.   On: 09/09/2017 08:59    Procedures Procedures (including critical care time) SPLINT APPLICATION Date/Time: 9:57 AM Authorized by: Karrie Meres Consent: Verbal consent obtained. Risks and benefits: risks, benefits and alternatives were discussed Consent given by: patient Splint applied by: orthopedic technician Location details: RLE Splint type: ASO splint Post-procedure: The splinted body part was neurovascularly unchanged following the procedure. Patient  tolerance: Patient tolerated the procedure well with no immediate complications.  Medications Ordered in ED Medications - No data to display   Initial Impression / Assessment and Plan / ED Course  I have reviewed the triage vital signs and the nursing notes.  Pertinent labs & imaging results that were available during my care of the patient were reviewed by me and considered in my medical decision making (see chart for details).   Final Clinical Impressions(s) / ED Diagnoses   Final diagnoses:  Sprain of right ankle, unspecified ligament, initial encounter   Patient was presenting with right ankle pain after mechanical fall yesterday.  Vital signs are stable and she is afebrile.  Ankle exam shows tenderness over the lateral malleolus.  X-ray of the right ankle is negative for acute fracture abnormality.  Will provide a so splint and crutches for comfort.  Advised Tylenol, ibuprofen, rice protocol for symptoms.  Will give Ortho follow-up if patient continues to have symptoms.  Otherwise advised her to follow-up with PCP in 1 week for reevaluation.  Advised to return to ER for any new or worsening symptoms in the meantime.  All questions answered patient understands plan and reasons to return immediately to the ED.  ED Discharge Orders    None       Rayne Du 09/09/17 0957    Benjiman Core, MD 09/09/17 [phone]

## 2017-09-09 NOTE — Discharge Instructions (Addendum)
You may alternate taking Tylenol and Ibuprofen as needed for pain control. You may take 400-600 mg of ibuprofen every 6 hours and [phone] mg of Tylenol every 6 hours. Do not exceed 4000 mg of Tylenol daily as this can lead to liver damage. Also, make sure to take Ibuprofen with meals as it can cause an upset stomach. Do not take other NSAIDs while taking Ibuprofen such as (Aleve, Naprosyn, Aspirin, Celebrex, etc) and do not take more than the prescribed dose as this can lead to ulcers and bleeding in your GI tract. You may use warm and cold compresses to help with your symptoms.   You were given a splint and crutches.  Please wear the splint for comfort.   Please follow up with your primary doctor within the next 7-10 days for re-evaluation and further treatment of your symptoms.  You are given information for an orthopedic doctor to follow-up with if you are continuing to have symptoms over the next 2 weeks.  Please return to the ER sooner if you have any new or worsening symptoms.

## 2017-10-08 IMAGING — US US MFM OB FOLLOW-UP
1 series · 14 of 28 positions shown · non-contrast
Comparison: none

[Series 1: us mfm ob follow-up · 86 acquisitions, 14 frames shown]
[im 4/86]
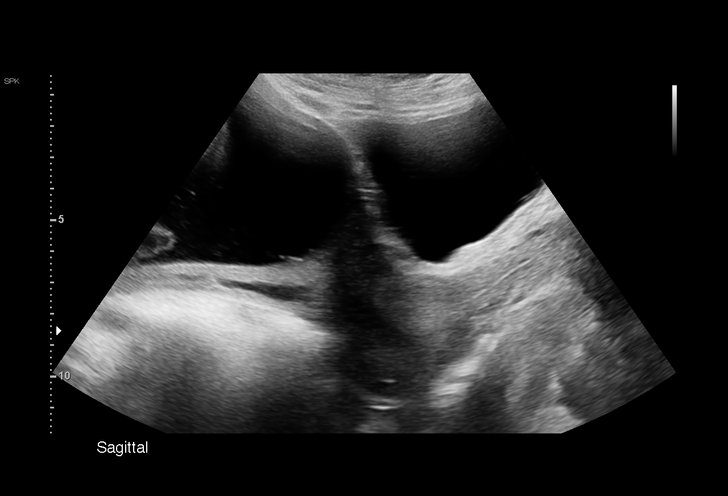
[im 10/86]
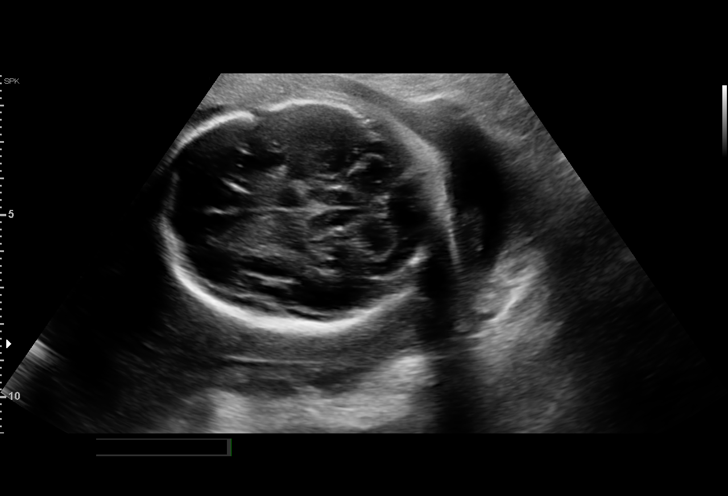
[im 16/86]
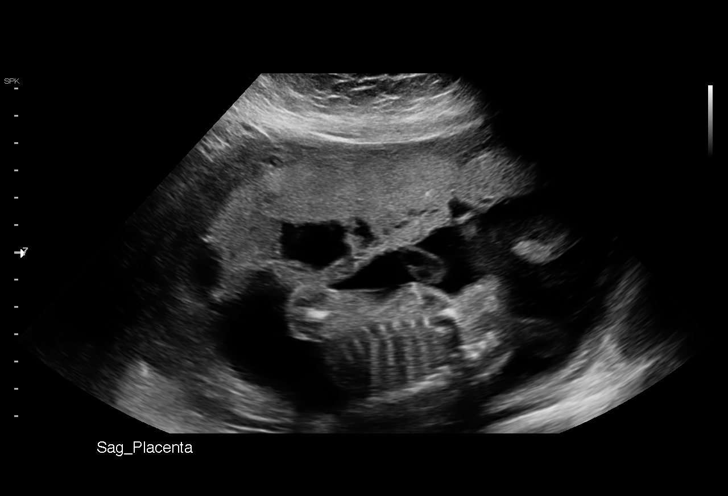
[im 23/86]
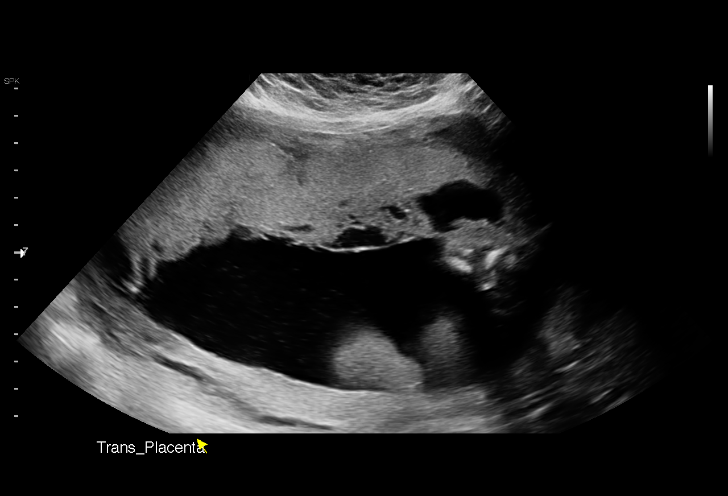
[im 29/86]
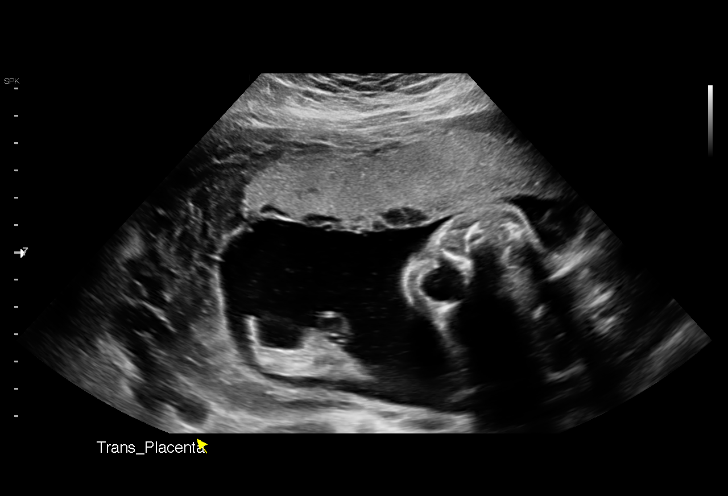
[im 35/86]
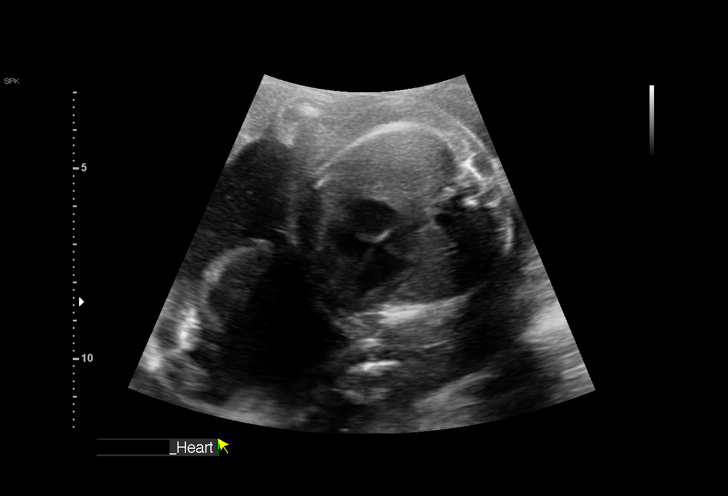
[im 41/86]
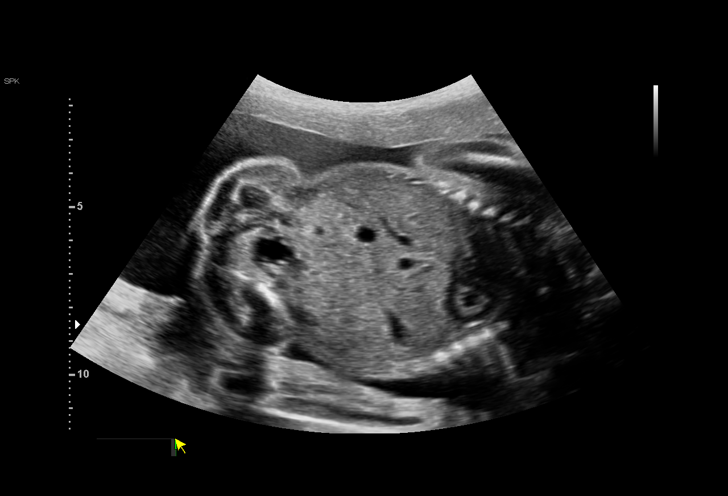
[im 48/86]
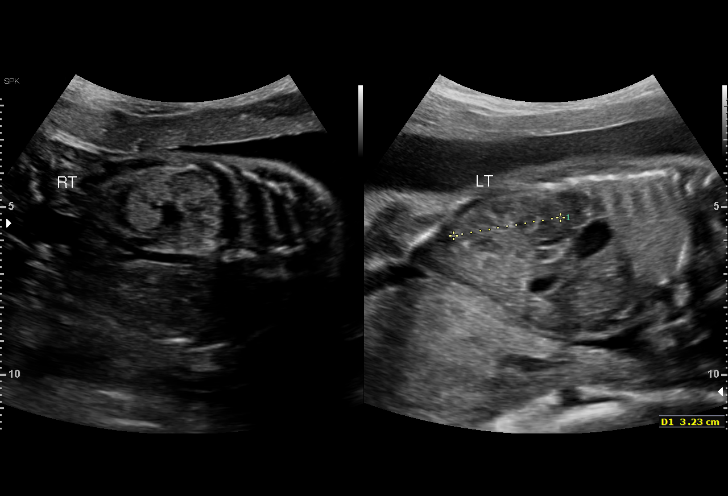
[im 54/86]
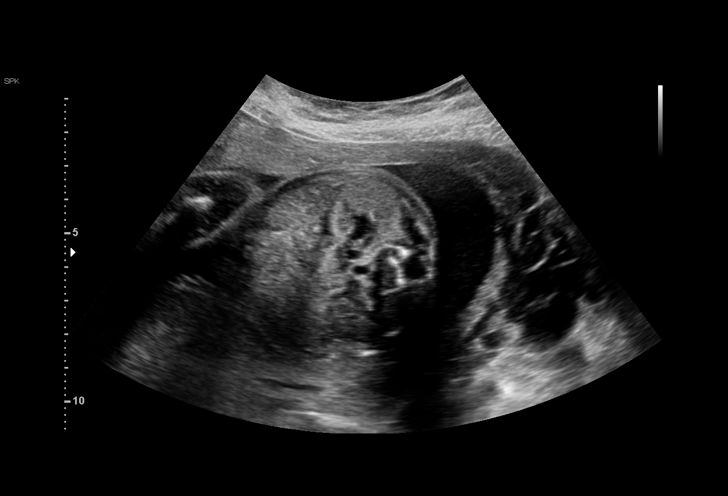
[im 60/86]
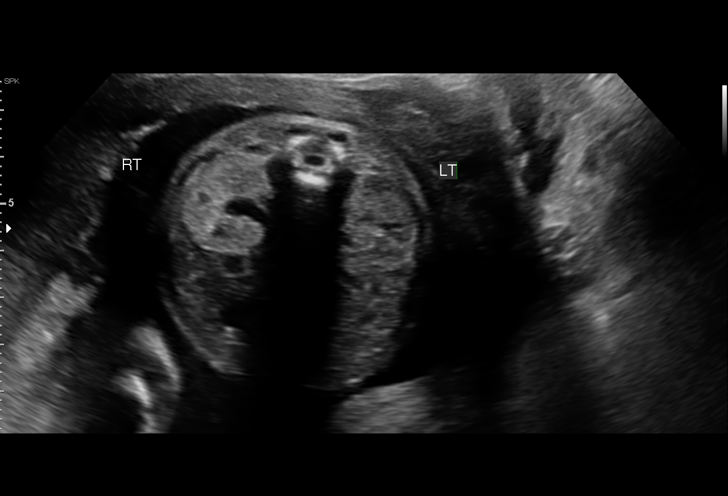
[im 67/86]
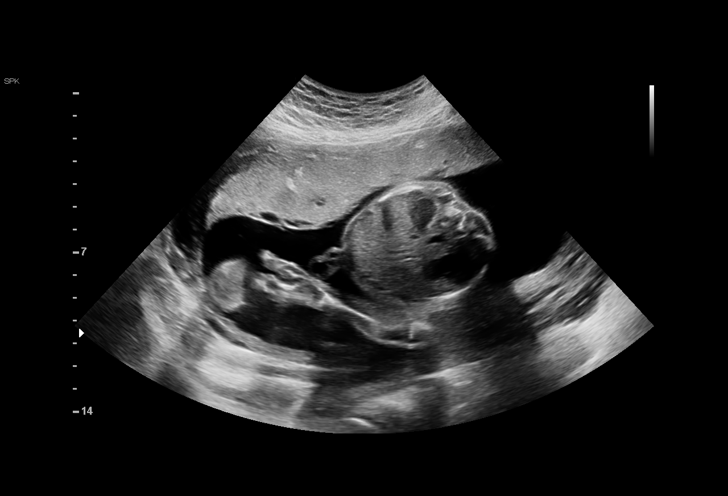
[im 73/86]
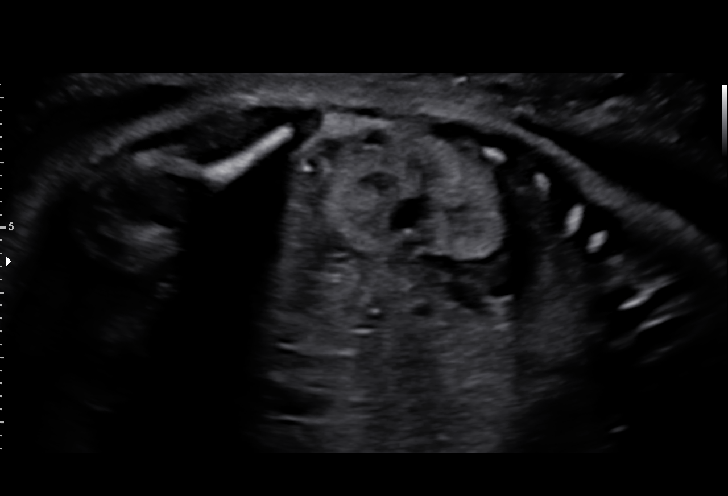
[im 79/86]
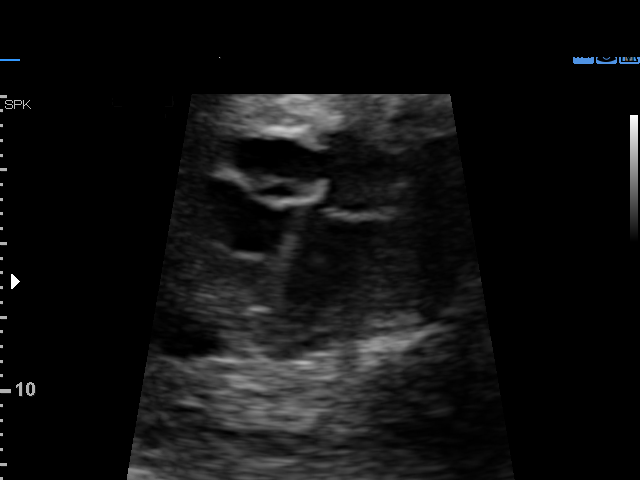
[im 86/86]
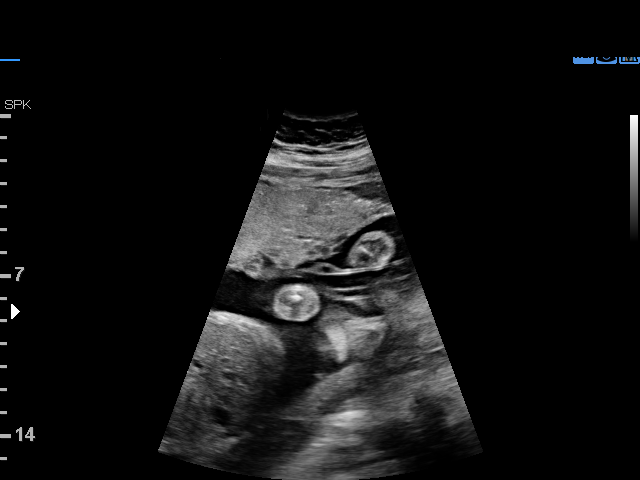

[14 of 28 positions shown; findings below may reference images not displayed]

1  TL WASSER            [id number]      [id number]     [id number]
Indications

25 weeks gestation of pregnancy
Poor obstetric history: Previous
preeclampsia / eclampsia/gestational HTN
Poor obstetric history: Previous preterm
delivery, antepartum
History of cesarean delivery, currently
pregnant (X's 2)
History of sickle cell trait
Fetal abnormality - other known or
suspected (renal abnormality on right)
OB History

Blood Type:            Height:  5'0"   Weight (lb):  164       BMI:
Gravidity:    4         Term:   1        Prem:   1         SAB:   0
TOP:          1       Ectopic:  0        Living: 2
Fetal Evaluation

Num Of Fetuses:     1
Fetal Heart         126
Rate(bpm):
Cardiac Activity:   Observed
Presentation:       Cephalic
Placenta:           Anterior, above cervical os

Amniotic Fluid
AFI FV:      Subjectively within normal limits

AFI Sum(cm)     %Tile       Largest Pocket(cm)
14.04           44
RUQ(cm)       RLQ(cm)       LUQ(cm)        LLQ(cm)
4.13
Biometry

BPD:      65.2  mm     G. Age:  26w 3d         84  %    CI:         74.21  %    70 - 86
FL/HC:       19.2  %    18.7 -
HC:      240.3  mm     G. Age:  26w 1d         68  %    HC/AC:       1.11       1.04 -
AC:      216.5  mm     G. Age:  26w 1d         76  %    FL/BPD:      70.7  %    71 - 87
FL:       46.1  mm     G. Age:  25w 2d         46  %    FL/AC:       21.3  %    20 - 24
HUM:      41.3  mm     G. Age:  25w 0d         42  %

Est. FW:     860   gm    1 lb 14 oz     70  %
Gestational Age

LMP:           23w 1d        Date:  11/20/15                 EDD:    08/26/16
U/S Today:     26w 0d                                        EDD:    08/06/16
Best:          25w 0d     Det. By:  Early Ultrasound         EDD:    08/13/16
(01/15/16)
Anatomy

Cranium:               Appears normal         Aortic Arch:            Previously seen
Cavum:                 Appears normal         Ductal Arch:            Previously seen
Ventricles:            Appears normal         Diaphragm:              Appears normal
Choroid Plexus:        Previously seen        Stomach:                Appears normal, left
sided
Cerebellum:            Appears normal         Abdomen:                Appears normal
Posterior Fossa:       Appears normal         Abdominal Wall:         Appears nml (cord
insert, abd wall)
Nuchal Fold:           Not applicable (>20    Cord Vessels:           Previously seen
wks GA)
Face:                  Appears normal         Kidneys:                Right UTD A2-3
(orbits and profile)
Echogenic
Lips:                  Previously seen        Bladder:                Appears normal
Thoracic:              Appears normal         Spine:                  Ltd views no
intracranial signs of
NT
Heart:                 Appears normal         Upper Extremities:      Previously seen
(4CH, axis, and situs
RVOT:                  Previously seen        Lower Extremities:      Previously seen
LVOT:                  Previously seen

Other:  Male gender. Nasal bone visualized.
Cervix Uterus Adnexa

Cervix
Length:           4.21  cm.
Normal appearance by transabdominal scan.

Uterus
No abnormality visualized.

Left Ovary
Not visualized.

Right Ovary
Not visualized.
Cul De Sac:   No free fluid seen.
Adnexa:       Not Visualized
Impression

SIUP at 25+0 weeks
Right kidney: echogenic without dilation of pelvis or calyces;
? tiny peripheral cysts; normal left kidney and bladder; no
ureters visualized
All other interval fetal anatomy was seen and appeared
normal; anatomic survey complete
Normal amniotic fluid volume
Appropriate interval growth with EFW at the 70th %tile
Recommendations

Follow-up ultrasound in 6 weeks to reassess kidneys

## 2017-12-29 ENCOUNTER — Ambulatory Visit (INDEPENDENT_AMBULATORY_CARE_PROVIDER_SITE_OTHER): Payer: Medicaid Other | Admitting: *Deleted

## 2017-12-29 DIAGNOSIS — Z23 Encounter for immunization: Secondary | ICD-10-CM

## 2017-12-29 NOTE — Progress Notes (Signed)
Pt is in office for 3rd Garadasil injection.   Pt tolerated injection well. Pt has no other concerns today.

## 2018-05-03 ENCOUNTER — Encounter: Payer: Self-pay | Admitting: Obstetrics

## 2018-05-03 ENCOUNTER — Ambulatory Visit (INDEPENDENT_AMBULATORY_CARE_PROVIDER_SITE_OTHER): Payer: Medicaid Other | Admitting: Obstetrics

## 2018-05-03 VITALS — BP 122/75 | HR 70 | Ht 60.0 in | Wt 142.2 lb

## 2018-05-03 DIAGNOSIS — R21 Rash and other nonspecific skin eruption: Secondary | ICD-10-CM | POA: Diagnosis not present

## 2018-05-03 DIAGNOSIS — N644 Mastodynia: Secondary | ICD-10-CM | POA: Diagnosis not present

## 2018-05-03 MED ORDER — IBUPROFEN 800 MG PO TABS: 800.0000 mg | ORAL_TABLET | Freq: Three times a day (TID) | ORAL | 5 refills | Status: DC | PRN

## 2018-05-03 NOTE — Progress Notes (Signed)
Patient ID: Renee Suarez, female   DOB: August 20, 1990, 28 y.o.   MRN: [id number]  Chief Complaint  Patient presents with  . Breast Pain    HPI Renee Suarez is a 28 y.o. female.  Pain in left breast and rash over left side of breast developing over the past month.  Denies feeling a palpable mass, and there is no nipple discharge. HPI  Past Medical History:  Diagnosis Date  . Anemia   . Eclampsia    with 1st preg  . GERD (gastroesophageal reflux disease)    post partum 1st preg.  [name] Headache   . Hypertension   . Pregnancy induced hypertension   . Preterm delivery   . Preterm labor   . Sickle cell trait [facility])     Past Surgical History:  Procedure Laterality Date  . APPENDECTOMY    . CESAREAN SECTION    . CESAREAN SECTION  04/25/2011   Procedure: CESAREAN SECTION;  Surgeon: Tereso Newcomer, MD;  Location: [facility] ORS;  Service: Gynecology;  Laterality: N/A;  Repeat C/Section  . CESAREAN SECTION WITH BILATERAL TUBAL LIGATION Bilateral 08/06/2016   Procedure: CESAREAN SECTION WITH BILATERAL TUBAL LIGATION;  Surgeon: Reva Bores, MD;  Location: [facility] BIRTHING SUITES;  Service: Obstetrics;  Laterality: Bilateral;  . CHOLECYSTECTOMY    . CHOLECYSTECTOMY  2010  . LAPAROSCOPIC APPENDECTOMY N/A 11/30/2016   Procedure: APPENDECTOMY LAPAROSCOPIC;  Surgeon: Kinsinger, De Blanch, MD;  Location: [facility] ORS;  Service: General;  Laterality: N/A;    Family History  Problem Relation Age of Onset  . Hypertension Paternal Grandmother   . Diabetes Paternal Grandmother   . Heart disease Paternal Grandmother   . Diabetes Mother   . Cancer Paternal Grandfather        pancreatic  . Mental illness Brother   . Mental retardation Brother     Social History Social History   Tobacco Use  . Smoking status: Former Games developer  . Smokeless tobacco: Never Used  Substance Use Topics  . Alcohol use: No  . Drug use: Yes    Types: Marijuana    Allergies  Allergen Reactions  . Hydrocodone-Acetaminophen Hives   . Iodine Solution [Povidone Iodine] Hives    Developed pin-point rash with itching where Betadine was used for C/S.    Current Outpatient Medications  Medication Sig Dispense Refill  . acetaminophen (TYLENOL) 500 MG tablet Take 1 tablet (500 mg total) by mouth every 8 (eight) hours as needed for mild pain or moderate pain. (Patient not taking: Reported on 06/24/2017) 30 tablet 0  . ciprofloxacin (CIPRO) 500 MG tablet Take 1 tablet (500 mg total) by mouth 2 (two) times daily. (Patient not taking: Reported on 05/03/2018) 14 tablet 0  . diphenhydrAMINE (BENADRYL) 25 MG tablet Take 2 tablets (50 mg total) by mouth every 6 (six) hours. (Patient not taking: Reported on 06/24/2017) 20 tablet 0  . famotidine (PEPCID) 20 MG tablet Take 1 tablet (20 mg total) by mouth 2 (two) times daily. (Patient not taking: Reported on 06/24/2017) 30 tablet 0  . ferrous sulfate 325 (65 FE) MG tablet Take 1 tablet (325 mg total) by mouth daily with breakfast. (Patient not taking: Reported on 11/30/2016) 30 tablet 0  . HYDROcodone-acetaminophen (NORCO/VICODIN) 5-325 MG tablet Take 1-2 tablets by mouth every 4 (four) hours as needed for moderate pain (if response to ibuprofen is not sufficient). (Patient not taking: Reported on 06/24/2017) 30 tablet 0  . hydrocortisone (ANUSOL-HC) 2.5 % rectal cream Apply rectally 2  times daily (Patient not taking: Reported on 06/24/2017) 28.35 g 0  . ibuprofen (ADVIL,MOTRIN) 800 MG tablet Take 1 tablet (800 mg total) by mouth every 8 (eight) hours as needed. 30 tablet 5  . imiquimod (ALDARA) 5 % cream Apply topically 3 (three) times a week. 12 each 4  . nystatin cream (MYCOSTATIN) Apply to affected area 2 times daily (Patient not taking: Reported on 12/04/2016) 30 g 0  . oseltamivir (TAMIFLU) 75 MG capsule Take 1 capsule (75 mg total) by mouth every 12 (twelve) hours. (Patient not taking: Reported on 05/03/2018) 10 capsule 0  . predniSONE (DELTASONE) 10 MG tablet Take 4 tablets (40 mg total) by mouth  daily. (Patient not taking: Reported on 06/24/2017) 20 tablet 0  . Prenatal Vit-Fe Fumarate-FA (PREPLUS) 27-1 MG TABS Take 1 tablet by mouth daily. (Patient not taking: Reported on 12/04/2016) 30 tablet 13  . traMADol (ULTRAM) 50 MG tablet Take 1 tablet (50 mg total) by mouth every 6 (six) hours as needed. (Patient not taking: Reported on 06/24/2017) 15 tablet 0  . triamcinolone cream (KENALOG) 0.1 % Apply 1 application topically 2 (two) times daily. (Patient not taking: Reported on 12/04/2016) 30 g 0   No current facility-administered medications for this visit.     Review of Systems Review of Systems Constitutional: negative for fatigue and weight loss Respiratory: negative for cough and wheezing Cardiovascular: negative for chest pain, fatigue and palpitations Gastrointestinal: negative for abdominal pain and change in bowel habits Genitourinary:negative Integument/breast: positive for left breast pain and rash Musculoskeletal:negative for myalgias Neurological: negative for gait problems and tremors Behavioral/Psych: negative for abusive relationship, depression Endocrine: negative for temperature intolerance      Blood pressure 122/75, pulse 70, height 5' (1.524 m), weight 142 lb 3.2 oz (64.5 kg), last menstrual period 05/01/2018, currently breastfeeding.  Physical Exam Physical Exam General:   alert  Skin:   rash on left side of left breast  Lungs:   clear to auscultation bilaterally  Heart:   regular rate and rhythm, S1, S2 normal, no murmur, click, rub or gallop  Breasts:   left breast is tender to palpation diffusely.  Rash over left side of left breast.  Negative nipple changes or discharge   50% of 15 min visit spent on counseling and coordination of care.   Data Reviewed Medication history  Assessment and Plan:    1. Mastodynia of left breast Rx: - MM DIAG BREAST TOMO BILATERAL; Future - US BREAST LTD UNI LEFT INC AXILLA; Future - ibuprofen (ADVIL,MOTRIN) 800 MG  tablet; Take 1 tablet (800 mg total) by mouth every 8 (eight) hours as needed.  Dispense: 30 tablet; Refill: 5  2. Rash Rx: - MM DIAG BREAST TOMO BILATERAL; Future - US BREAST LTD UNI LEFT INC AXILLA; Future    Plan    Follow up in 2 weeks   Orders Placed This Encounter  Procedures  . MM DIAG BREAST TOMO BILATERAL    Standing Status:   Future    Standing Expiration Date:   07/02/2019    Order Specific Question:   Reason for Exam (SYMPTOM  OR DIAGNOSIS REQUIRED)    Answer:   Breast pain - left breast.  Rash - left breast.    Order Specific Question:   Is the patient pregnant?    Answer:   No    Order Specific Question:   Preferred imaging location?    Answer:   [facility]  . US BREAST LTD UNI LEFT INC  AXILLA    Standing Status:   Future    Standing Expiration Date:   07/02/2019    Order Specific Question:   Reason for Exam (SYMPTOM  OR DIAGNOSIS REQUIRED)    Answer:   Breast pain - left breast.  Rash - left breast.    Order Specific Question:   Preferred imaging location?    Answer:   [facility]   Meds ordered this encounter  Medications  . ibuprofen (ADVIL,MOTRIN) 800 MG tablet    Sig: Take 1 tablet (800 mg total) by mouth every 8 (eight) hours as needed.    Dispense:  30 tablet    Refill:  5    Brock Bad MD 05-03-2018

## 2018-05-03 NOTE — Progress Notes (Signed)
GYN.  C/o left breast pain 5/10 the vein feels ropey x 1 month. Rash on left breast.  Paternal Grandmother - Breast Biopsy.

## 2018-05-05 ENCOUNTER — Ambulatory Visit: Payer: Self-pay

## 2018-05-07 ENCOUNTER — Ambulatory Visit
Admission: RE | Admit: 2018-05-07 | Discharge: 2018-05-07 | Disposition: A | Discharge status: Home / Self Care | Payer: Medicaid Other | Source: Ambulatory Visit | Attending: Obstetrics | Admitting: Obstetrics

## 2018-05-07 DIAGNOSIS — N644 Mastodynia: Secondary | ICD-10-CM | POA: Diagnosis not present

## 2018-05-07 DIAGNOSIS — R21 Rash and other nonspecific skin eruption: Secondary | ICD-10-CM

## 2018-05-25 ENCOUNTER — Ambulatory Visit: Payer: Self-pay | Admitting: Obstetrics

## 2019-02-07 ENCOUNTER — Other Ambulatory Visit: Payer: Self-pay

## 2019-02-07 ENCOUNTER — Encounter (HOSPITAL_COMMUNITY): Payer: Self-pay | Admitting: Emergency Medicine

## 2019-02-07 DIAGNOSIS — R300 Dysuria: Secondary | ICD-10-CM | POA: Diagnosis not present

## 2019-02-07 DIAGNOSIS — Z5321 Procedure and treatment not carried out due to patient leaving prior to being seen by health care provider: Secondary | ICD-10-CM | POA: Insufficient documentation

## 2019-02-07 DIAGNOSIS — R509 Fever, unspecified: Secondary | ICD-10-CM | POA: Diagnosis not present

## 2019-02-07 DIAGNOSIS — M545 Low back pain: Secondary | ICD-10-CM | POA: Diagnosis present

## 2019-02-07 LAB — URINALYSIS, ROUTINE W REFLEX MICROSCOPIC
Bilirubin Urine: NEGATIVE
Glucose, UA: NEGATIVE mg/dL
Ketones, ur: 5 mg/dL — AB
Nitrite: POSITIVE — AB
Protein, ur: NEGATIVE mg/dL
Specific Gravity, Urine: 1.012 (ref 1.005–1.030)
pH: 5 (ref 5.0–8.0)

## 2019-02-07 LAB — CBC WITH DIFFERENTIAL/PLATELET
Abs Immature Granulocytes: 0.02 10*3/uL (ref 0.00–0.07)
Basophils Absolute: 0 10*3/uL (ref 0.0–0.1)
Basophils Relative: 0 %
Eosinophils Absolute: 0 10*3/uL (ref 0.0–0.5)
Eosinophils Relative: 0 %
HCT: 39.4 % (ref 36.0–46.0)
Hemoglobin: 12.9 g/dL (ref 12.0–15.0)
Immature Granulocytes: 0 %
Lymphocytes Relative: 9 %
Lymphs Abs: 0.9 10*3/uL (ref 0.7–4.0)
MCH: 26.5 pg (ref 26.0–34.0)
MCHC: 32.7 g/dL (ref 30.0–36.0)
MCV: 81.1 fL (ref 80.0–100.0)
Monocytes Absolute: 0.6 10*3/uL (ref 0.1–1.0)
Monocytes Relative: 7 %
Neutro Abs: 7.6 10*3/uL (ref 1.7–7.7)
Neutrophils Relative %: 84 %
Platelets: 166 10*3/uL (ref 150–400)
RBC: 4.86 MIL/uL (ref 3.87–5.11)
RDW: 12.8 % (ref 11.5–15.5)
WBC: 9.2 10*3/uL (ref 4.0–10.5)
nRBC: 0 % (ref 0.0–0.2)

## 2019-02-07 LAB — COMPREHENSIVE METABOLIC PANEL
ALT: 10 U/L (ref 0–44)
AST: 14 U/L — ABNORMAL LOW (ref 15–41)
Albumin: 4.2 g/dL (ref 3.5–5.0)
Alkaline Phosphatase: 33 U/L — ABNORMAL LOW (ref 38–126)
Anion gap: 10 (ref 5–15)
BUN: 9 mg/dL (ref 6–20)
CO2: 21 mmol/L — ABNORMAL LOW (ref 22–32)
Calcium: 9.2 mg/dL (ref 8.9–10.3)
Chloride: 106 mmol/L (ref 98–111)
Creatinine, Ser: 0.68 mg/dL (ref 0.44–1.00)
GFR calc Af Amer: >60 mL/min (ref >60)
GFR calc non Af Amer: >60 mL/min (ref >60)
Glucose, Bld: 97 mg/dL (ref 70–99)
Potassium: 3.6 mmol/L (ref 3.5–5.1)
Sodium: 137 mmol/L (ref 135–145)
Total Bilirubin: 1 mg/dL (ref 0.3–1.2)
Total Protein: 7.6 g/dL (ref 6.5–8.1)

## 2019-02-07 LAB — I-STAT BETA HCG BLOOD, ED (MC, WL, AP ONLY): I-stat hCG, quantitative: <5 m[IU]/mL (ref <5)

## 2019-02-07 LAB — LACTIC ACID, PLASMA: Lactic Acid, Venous: 1.1 mmol/L (ref 0.5–1.9)

## 2019-02-07 MED ORDER — ACETAMINOPHEN 325 MG PO TABS
650.0000 mg | ORAL_TABLET | Freq: Once | ORAL | Status: AC | PRN
  Administered 2019-02-07: 650 mg via ORAL
  Filled 2019-02-07: qty 2

## 2019-02-07 MED ORDER — SODIUM CHLORIDE 0.9% FLUSH: 3.0000 mL | Freq: Once | INTRAVENOUS | Status: DC

## 2019-02-07 NOTE — ED Notes (Signed)
I called patient back to be triage and no one responded 

## 2019-02-07 NOTE — ED Triage Notes (Signed)
Pt complaint of lower back/flank pain and pain with urination for 2 days; fever noted with triage. "I feel weak."

## 2019-02-08 ENCOUNTER — Other Ambulatory Visit: Payer: Self-pay

## 2019-02-08 ENCOUNTER — Emergency Department (HOSPITAL_COMMUNITY)
Admission: EM | Admit: 2019-02-08 | Discharge: 2019-02-08 | Disposition: A | Discharge status: Home / Self Care | Payer: Medicaid Other | Attending: Emergency Medicine | Admitting: Emergency Medicine

## 2019-02-08 MED ORDER — CIPROFLOXACIN HCL 500 MG PO TABS: 500.0000 mg | ORAL_TABLET | Freq: Two times a day (BID) | ORAL | 0 refills | Status: DC

## 2019-02-08 NOTE — Telephone Encounter (Signed)
Patient called stating that she went to ED last night but ending up leaving because of wait. Patient is suspecting UTI and urinalysis done in ED does show sign of UTI.  Urine culture is still pending.  Will send in Antibiotic for patient and she is to call back if she is not feeling better in next day or two with taking antibiotic. Patient states understanding. Kathrene Alu RN

## 2019-02-09 LAB — URINE CULTURE: Culture: >=100000 — AB

## 2019-02-10 ENCOUNTER — Telehealth: Payer: Self-pay

## 2019-02-10 NOTE — Telephone Encounter (Signed)
Post ED Visit - Positive Culture Follow-up  Culture report reviewed by antimicrobial stewardship pharmacist: [place] Team []  Elenor Quinones, Pharm.D. []  Heide Guile, Pharm.D., BCPS AQ-ID []  Parks Neptune, Pharm.D., BCPS []  Alycia Rossetti, Pharm.D., BCPS []  [place], Pharm.D., BCPS, AAHIVP []  Legrand Como, Pharm.D., BCPS, AAHIVP []  Salome Arnt, PharmD, BCPS []  Johnnette Gourd, PharmD, BCPS []  Hughes Better, PharmD, BCPS []  Leeroy Cha, PharmD []  Laqueta Linden, PharmD, BCPS []  Albertina Parr, PharmD  [place] Team []  Leodis Sias, PharmD []  Lindell Spar, PharmD []  Royetta Asal, PharmD []  Graylin Shiver, Rph []  Rema Fendt) Glennon Mac, PharmD []  Arlyn Dunning, PharmD []  Netta Cedars, PharmD []  Dia Sitter, PharmD []  Leone [place], PharmD []  Gretta Arab, PharmD []  Theodis Shove, PharmD []  Peggyann Juba, PharmD [x]  Reuel Boom, PharmD   Positive urine culture Treated with Ciprofloxacin, organism sensitive to the same and no further patient follow-up is required at this time.  Genia Del 02/10/2019, 10:03 AM

## 2019-09-20 ENCOUNTER — Other Ambulatory Visit: Payer: Self-pay

## 2019-09-20 ENCOUNTER — Other Ambulatory Visit (HOSPITAL_COMMUNITY)
Admission: RE | Admit: 2019-09-20 | Discharge: 2019-09-20 | Disposition: A | Discharge status: Home / Self Care | Payer: Medicaid Other | Source: Ambulatory Visit | Attending: Obstetrics | Admitting: Obstetrics

## 2019-09-20 ENCOUNTER — Ambulatory Visit: Payer: Medicaid Other | Admitting: Obstetrics

## 2019-09-20 ENCOUNTER — Encounter: Payer: Self-pay | Admitting: Obstetrics

## 2019-09-20 VITALS — BP 104/69 | HR 83 | Wt 120.1 lb

## 2019-09-20 DIAGNOSIS — B369 Superficial mycosis, unspecified: Secondary | ICD-10-CM

## 2019-09-20 DIAGNOSIS — Z113 Encounter for screening for infections with a predominantly sexual mode of transmission: Secondary | ICD-10-CM

## 2019-09-20 DIAGNOSIS — Z1272 Encounter for screening for malignant neoplasm of vagina: Secondary | ICD-10-CM | POA: Diagnosis not present

## 2019-09-20 DIAGNOSIS — L299 Pruritus, unspecified: Secondary | ICD-10-CM | POA: Diagnosis not present

## 2019-09-20 DIAGNOSIS — N898 Other specified noninflammatory disorders of vagina: Secondary | ICD-10-CM | POA: Insufficient documentation

## 2019-09-20 DIAGNOSIS — R634 Abnormal weight loss: Secondary | ICD-10-CM | POA: Diagnosis not present

## 2019-09-20 DIAGNOSIS — Z01419 Encounter for gynecological examination (general) (routine) without abnormal findings: Secondary | ICD-10-CM | POA: Diagnosis not present

## 2019-09-20 DIAGNOSIS — Z0000 Encounter for general adult medical examination without abnormal findings: Secondary | ICD-10-CM

## 2019-09-20 MED ORDER — CLOTRIMAZOLE 1 % EX CREA: 1.0000 "application " | TOPICAL_CREAM | Freq: Two times a day (BID) | CUTANEOUS | 1 refills | Status: DC

## 2019-09-20 NOTE — Progress Notes (Signed)
Subjective:        Renee Suarez is a 29 y.o. female here for a routine exam.  Current complaints: Rash and itching on chest and back.  Personal health questionnaire:  Is patient Ashkenazi Jewish, have a family history of breast and/or ovarian cancer: no Is there a family history of uterine cancer diagnosed at age < 64, gastrointestinal cancer, urinary tract cancer, family member who is a Personnel officer syndrome-associated carrier: no Is the patient overweight and hypertensive, family history of diabetes, personal history of gestational diabetes, preeclampsia or PCOS: no Is patient over 67, have PCOS,  family history of premature CHD under age 27, diabetes, smoke, have hypertension or peripheral artery disease:  no At any time, has a partner hit, kicked or otherwise hurt or frightened you?: no Over the past 2 weeks, have you felt down, depressed or hopeless?: no Over the past 2 weeks, have you felt little interest or pleasure in doing things?:no   Gynecologic History No LMP recorded. Contraception: tubal ligation Last Pap: 2017. Results were: normal Last mammogram: n/a. Results were: n/a  Obstetric History OB History  Gravida Para Term Preterm AB Living  4 3 2 1 1 3   SAB TAB Ectopic Multiple Live Births  0 1 0 0 3    # Outcome Date GA Lbr Len/2nd Weight Sex Delivery Anes PTL Lv  4 Term 08/06/16 [redacted]w[redacted]d  7 lb 0.5 oz (3.19 kg) M CS-LTranv EPI  LIV  3 Term 04/25/11 [redacted]w[redacted]d  7 lb 0.4 oz (3.185 kg) F CS-LTranv Spinal  LIV  2 Preterm 04/15/08 [redacted]w[redacted]d  3 lb 13 oz (1.729 kg) F CS-Unspec Spinal  LIV  1 TAB             Past Medical History:  Diagnosis Date  . Anemia   . Eclampsia    with 1st preg  . GERD (gastroesophageal reflux disease)    post partum 1st preg.  [redacted]w[redacted]d Headache   . Hypertension   . Pregnancy induced hypertension   . Preterm delivery   . Preterm labor   . Sickle cell trait [facility])     Past Surgical History:  Procedure Laterality Date  . APPENDECTOMY    . CESAREAN SECTION     . CESAREAN SECTION  04/25/2011   Procedure: CESAREAN SECTION;  Surgeon: 04/27/2011, MD;  Location: [facility] ORS;  Service: Gynecology;  Laterality: N/A;  Repeat C/Section  . CESAREAN SECTION WITH BILATERAL TUBAL LIGATION Bilateral 08/06/2016   Procedure: CESAREAN SECTION WITH BILATERAL TUBAL LIGATION;  Surgeon: 10/06/2016, MD;  Location: [facility];  Service: Obstetrics;  Laterality: Bilateral;  . CHOLECYSTECTOMY    . CHOLECYSTECTOMY  2010  . LAPAROSCOPIC APPENDECTOMY N/A 11/30/2016   Procedure: APPENDECTOMY LAPAROSCOPIC;  Surgeon: 01/30/2017, MD;  Location: [facility] ORS;  Service: General;  Laterality: N/A;     Current Outpatient Medications:  .  diphenhydrAMINE (BENADRYL) 25 MG tablet, Take 2 tablets (50 mg total) by mouth every 6 (six) hours., Disp: 20 tablet, Rfl: 0 .  hydrocortisone cream 0.5 %, Apply 1 application topically 2 (two) times daily., Disp: , Rfl:  .  ciprofloxacin (CIPRO) 500 MG tablet, Take 1 tablet (500 mg total) by mouth 2 (two) times daily. (Patient not taking: Reported on 09/20/2019), Disp: 14 tablet, Rfl: 0 .  clotrimazole (LOTRIMIN) 1 % cream, Apply 1 application topically 2 (two) times daily., Disp: 113 g, Rfl: 1 .  famotidine (PEPCID) 20 MG tablet, Take 1 tablet (20 mg  total) by mouth 2 (two) times daily. (Patient not taking: Reported on 06/24/2017), Disp: 30 tablet, Rfl: 0 .  ferrous sulfate 325 (65 FE) MG tablet, Take 1 tablet (325 mg total) by mouth daily with breakfast. (Patient not taking: Reported on 11/30/2016), Disp: 30 tablet, Rfl: 0 .  HYDROcodone-acetaminophen (NORCO/VICODIN) 5-325 MG tablet, Take 1-2 tablets by mouth every 4 (four) hours as needed for moderate pain (if response to ibuprofen is not sufficient). (Patient not taking: Reported on 06/24/2017), Disp: 30 tablet, Rfl: 0 .  hydrocortisone (ANUSOL-HC) 2.5 % rectal cream, Apply rectally 2 times daily (Patient not taking: Reported on 06/24/2017), Disp: 28.35 g, Rfl: 0 .  ibuprofen  (ADVIL,MOTRIN) 800 MG tablet, Take 1 tablet (800 mg total) by mouth every 8 (eight) hours as needed. (Patient not taking: Reported on 09/20/2019), Disp: 30 tablet, Rfl: 5 .  imiquimod (ALDARA) 5 % cream, Apply topically 3 (three) times a week. (Patient not taking: Reported on 09/20/2019), Disp: 12 each, Rfl: 4 .  nystatin cream (MYCOSTATIN), Apply to affected area 2 times daily (Patient not taking: Reported on 12/04/2016), Disp: 30 g, Rfl: 0 .  oseltamivir (TAMIFLU) 75 MG capsule, Take 1 capsule (75 mg total) by mouth every 12 (twelve) hours. (Patient not taking: Reported on 05/03/2018), Disp: 10 capsule, Rfl: 0 .  predniSONE (DELTASONE) 10 MG tablet, Take 4 tablets (40 mg total) by mouth daily. (Patient not taking: Reported on 06/24/2017), Disp: 20 tablet, Rfl: 0 .  Prenatal Vit-Fe Fumarate-FA (PREPLUS) 27-1 MG TABS, Take 1 tablet by mouth daily. (Patient not taking: Reported on 12/04/2016), Disp: 30 tablet, Rfl: 13 .  traMADol (ULTRAM) 50 MG tablet, Take 1 tablet (50 mg total) by mouth every 6 (six) hours as needed. (Patient not taking: Reported on 06/24/2017), Disp: 15 tablet, Rfl: 0 .  triamcinolone cream (KENALOG) 0.1 %, Apply 1 application topically 2 (two) times daily. (Patient not taking: Reported on 12/04/2016), Disp: 30 g, Rfl: 0 Allergies  Allergen Reactions  . Hydrocodone-Acetaminophen Hives  . Iodine Solution [Povidone Iodine] Hives    Developed pin-point rash with itching where Betadine was used for C/S.    Social History   Tobacco Use  . Smoking status: Former Research scientist (life sciences)  . Smokeless tobacco: Never Used  Substance Use Topics  . Alcohol use: No    Family History  Problem Relation Age of Onset  . Hypertension Paternal Grandmother   . Diabetes Paternal Grandmother   . Heart disease Paternal Grandmother   . Diabetes Mother   . Cancer Paternal Grandfather        pancreatic  . Mental illness Brother   . Mental retardation Brother       Review of Systems  Constitutional: negative for  fatigue and weight loss Respiratory: negative for cough and wheezing Cardiovascular: negative for chest pain, fatigue and palpitations Gastrointestinal: negative for abdominal pain and change in bowel habits Musculoskeletal:negative for myalgias Neurological: negative for gait problems and tremors Behavioral/Psych: negative for abusive relationship, depression Endocrine: negative for temperature intolerance    Genitourinary:negative for abnormal menstrual periods, genital lesions, hot flashes, sexual problems. Integument/breast: negative for breast lump, breast tenderness, nipple discharge. Positive for rash and itching on chest and back.    Objective:       BP 104/69   Pulse 83   Wt 120 lb 1.6 oz (54.5 kg)   BMI 23.46 kg/m  General:   alert  Skin:    Rash on chest and back  Lungs:   clear to auscultation bilaterally  Heart:   regular rate and rhythm, S1, S2 normal, no murmur, click, rub or gallop  Breasts:   normal without suspicious masses, skin or nipple changes or axillary nodes  Abdomen:  normal findings: no organomegaly, soft, non-tender and no hernia  Pelvis:  External genitalia: normal general appearance Urinary system: urethral meatus normal and bladder without fullness, nontender Vaginal: normal without tenderness, induration or masses Cervix: normal appearance Adnexa: normal bimanual exam Uterus: anteverted and non-tender, normal size   Lab Review Urine pregnancy test Labs reviewed yes Radiologic studies reviewed no  50% of 20 min visit spent on counseling and coordination of care.   Assessment:     1. Encounter for routine gynecological examination with Papanicolaou smear of cervix Rx: - Cytology - PAP( [facility])  2. Vaginal discharge Rx: - Cervicovaginal ancillary only  3. Screening examination for STD (sexually transmitted disease) Rx: - Hepatitis B surface antigen - Hepatitis C antibody - HIV Antibody (routine testing w rflx) - RPR  4.  Abnormal weight loss Rx: - TSH - Comprehensive metabolic panel - CBC  5. Pruritus  6. Superficial fungus infection of skin Rx: - clotrimazole (LOTRIMIN) 1 % cream; Apply 1 application topically 2 (two) times daily.  Dispense: 113 g; Refill: 1    Plan:    Education reviewed: calcium supplements, depression evaluation, low fat, low cholesterol diet, safe sex/STD prevention, self breast exams and weight bearing exercise. Follow up in: 1 year.   Meds ordered this encounter  Medications  . clotrimazole (LOTRIMIN) 1 % cream    Sig: Apply 1 application topically 2 (two) times daily.    Dispense:  113 g    Refill:  1   Orders Placed This Encounter  Procedures  . Hepatitis B surface antigen  . Hepatitis C antibody  . HIV Antibody (routine testing w rflx)  . RPR  . TSH  . Comprehensive metabolic panel  . CBC    Brock Bad, MD 09/20/2019 4:54 PM

## 2019-09-20 NOTE — Progress Notes (Signed)
Pt presents for vaginal itching and itchy rash over her entire body. Denies change in soap/diet No relief with Benadryl per pt Pt also concerned she's unable to gain weight.

## 2019-09-21 ENCOUNTER — Ambulatory Visit: Payer: Medicaid Other

## 2019-09-21 ENCOUNTER — Encounter: Payer: Self-pay | Admitting: Obstetrics

## 2019-09-21 LAB — HIV ANTIBODY (ROUTINE TESTING W REFLEX): HIV Screen 4th Generation wRfx: NONREACTIVE

## 2019-09-21 LAB — HEPATITIS B SURFACE ANTIGEN: Hepatitis B Surface Ag: NEGATIVE

## 2019-09-21 LAB — RPR: RPR Ser Ql: NONREACTIVE

## 2019-09-21 LAB — HEPATITIS C ANTIBODY: Hep C Virus Ab: <0.1 s/co ratio (ref 0.0–0.9)

## 2019-09-22 LAB — CYTOLOGY - PAP
Comment: NEGATIVE
Diagnosis: NEGATIVE
High risk HPV: NEGATIVE

## 2019-09-22 LAB — CERVICOVAGINAL ANCILLARY ONLY
Bacterial Vaginitis (gardnerella): NEGATIVE
Candida Glabrata: NEGATIVE
Candida Vaginitis: NEGATIVE
Chlamydia: NEGATIVE
Comment: NEGATIVE
Comment: NEGATIVE
Comment: NEGATIVE
Comment: NEGATIVE
Comment: NEGATIVE
Comment: NORMAL
Neisseria Gonorrhea: NEGATIVE
Trichomonas: NEGATIVE

## 2020-02-08 ENCOUNTER — Emergency Department (HOSPITAL_COMMUNITY): Payer: Medicaid Other

## 2020-02-08 ENCOUNTER — Emergency Department (HOSPITAL_COMMUNITY)
Admission: EM | Admit: 2020-02-08 | Discharge: 2020-02-08 | Disposition: A | Discharge status: Home / Self Care | Payer: Medicaid Other | Attending: Emergency Medicine | Admitting: Emergency Medicine

## 2020-02-08 ENCOUNTER — Encounter (HOSPITAL_COMMUNITY): Payer: Self-pay | Admitting: *Deleted

## 2020-02-08 ENCOUNTER — Other Ambulatory Visit: Payer: Self-pay

## 2020-02-08 DIAGNOSIS — I10 Essential (primary) hypertension: Secondary | ICD-10-CM | POA: Diagnosis not present

## 2020-02-08 DIAGNOSIS — R0602 Shortness of breath: Secondary | ICD-10-CM | POA: Diagnosis not present

## 2020-02-08 DIAGNOSIS — Z87891 Personal history of nicotine dependence: Secondary | ICD-10-CM | POA: Insufficient documentation

## 2020-02-08 DIAGNOSIS — R197 Diarrhea, unspecified: Secondary | ICD-10-CM | POA: Diagnosis not present

## 2020-02-08 DIAGNOSIS — Z7951 Long term (current) use of inhaled steroids: Secondary | ICD-10-CM | POA: Diagnosis not present

## 2020-02-08 DIAGNOSIS — B9789 Other viral agents as the cause of diseases classified elsewhere: Secondary | ICD-10-CM | POA: Diagnosis not present

## 2020-02-08 DIAGNOSIS — Z79899 Other long term (current) drug therapy: Secondary | ICD-10-CM | POA: Insufficient documentation

## 2020-02-08 DIAGNOSIS — Z20822 Contact with and (suspected) exposure to covid-19: Secondary | ICD-10-CM | POA: Diagnosis not present

## 2020-02-08 DIAGNOSIS — J069 Acute upper respiratory infection, unspecified: Secondary | ICD-10-CM

## 2020-02-08 LAB — RESPIRATORY PANEL BY RT PCR (FLU A&B, COVID)
Influenza A by PCR: NEGATIVE
Influenza B by PCR: NEGATIVE
SARS Coronavirus 2 by RT PCR: NEGATIVE

## 2020-02-08 MED ORDER — ALBUTEROL SULFATE HFA 108 (90 BASE) MCG/ACT IN AERS: 1.0000 | INHALATION_SPRAY | Freq: Four times a day (QID) | RESPIRATORY_TRACT | 0 refills | Status: DC | PRN

## 2020-02-08 MED ORDER — PREDNISONE 50 MG PO TABS: 50.0000 mg | ORAL_TABLET | Freq: Every day | ORAL | 0 refills | Status: AC

## 2020-02-08 MED ORDER — IPRATROPIUM-ALBUTEROL 0.5-2.5 (3) MG/3ML IN SOLN: 3.0000 mL | Freq: Once | RESPIRATORY_TRACT | 0 refills | Status: DC

## 2020-02-08 MED ORDER — FLUTICASONE PROPIONATE 50 MCG/ACT NA SUSP: 2.0000 | Freq: Every day | NASAL | 0 refills | Status: DC

## 2020-02-08 MED ORDER — ALBUTEROL SULFATE HFA 108 (90 BASE) MCG/ACT IN AERS
2.0000 | INHALATION_SPRAY | Freq: Once | RESPIRATORY_TRACT | Status: AC
  Administered 2020-02-08: 2 via RESPIRATORY_TRACT
  Filled 2020-02-08: qty 6.7

## 2020-02-08 MED ORDER — IPRATROPIUM-ALBUTEROL 0.5-2.5 (3) MG/3ML IN SOLN: 3.0000 mL | RESPIRATORY_TRACT | 0 refills | Status: DC | PRN

## 2020-02-08 MED ORDER — PREDNISONE 20 MG PO TABS
60.0000 mg | ORAL_TABLET | Freq: Once | ORAL | Status: AC
  Administered 2020-02-08: 60 mg via ORAL
  Filled 2020-02-08: qty 3

## 2020-02-08 NOTE — ED Provider Notes (Signed)
[facility]-EMERGENCY DEPT Provider Note   CSN: [id number] Arrival date & time: 02/08/20  [id number]    History Chief Complaint  Patient presents with  . Shortness of Breath  . Cough    Renee Suarez is a 29 y.o. female with past medical history significant for anemia presents for evaluation of upper respiratory symptoms.  Tested negative for Covid last week.  States she now has green productive cough.  Feels shortness of breath with coughing. No chest pain. COVID UNvaccinated. Children had exposures to COVID at school 2 weeks ago.  Cough with 3 days of diarrhea without melena or bright red per rectum.  Has not any nausea or vomiting.  Has had some congestion and rhinorrhea which is clear in nature.  Coughs more at night.  No hemoptysis.  Feels like she has had a fever however she has not taken her temperature.  Has diffuse myalgias.  Has been tolerating p.o. intake at home without difficulty.  Denies headache, lightheadedness, dizziness, chest pain, abdominal pain, unilateral leg swelling, redness, warmth.  No prior history of PE or DVT. No recent surgery, immobilization, clotting disorder.  Has been using her child's nebulizers for his asthma which patient states significantly helped with her shortness of breath.  Feels like she is "wheezing."  Denies additional aggravating or alleviating factors.  History obtained from patient and past medical records. No interpretor was used.    HPI     Past Medical History:  Diagnosis Date  . Anemia   . Eclampsia    with 1st preg  . GERD (gastroesophageal reflux disease)    post partum 1st preg.  [name]. Headache   . Hypertension   . Pregnancy induced hypertension   . Preterm delivery   . Preterm labor   . Sickle cell trait [facility](HCC)     Patient Active Problem List   Diagnosis Date Noted  . Acute appendicitis 11/30/2016  . Postpartum care following cesarean delivery 08/06/2016  . Sickle cell trait (HCC) 01/30/2016  . History of  cesarean delivery, antepartum 01/22/2016    Past Surgical History:  Procedure Laterality Date  . APPENDECTOMY    . CESAREAN SECTION    . CESAREAN SECTION  04/25/2011   Procedure: CESAREAN SECTION;  Surgeon: [name], MD;  Location: [facility];  Service: Gynecology;  Laterality: N/A;  Repeat C/Section  . CESAREAN SECTION WITH BILATERAL TUBAL LIGATION Bilateral 08/06/2016   Procedure: CESAREAN SECTION WITH BILATERAL TUBAL LIGATION;  Surgeon: Reva Boresanya S Pratt, MD;  [facility];  Service: Obstetrics;  Laterality: Bilateral;  . CHOLECYSTECTOMY    . CHOLECYSTECTOMY  2010  . LAPAROSCOPIC APPENDECTOMY N/A 11/30/2016   Procedure: APPENDECTOMY LAPAROSCOPIC;  Surgeon: Rodman PickleKinsinger, Luke Aaron, MD;  Location: [facility];  Service: General;  Laterality: N/A;     OB History    Gravida  4   Para  3   Term  2   Preterm  1   AB  1   Living  3     SAB  0   TAB  1   Ectopic  0   Multiple  0   Live Births  3           Family History  Problem Relation Age of Onset  . Hypertension Paternal Grandmother   . Diabetes Paternal Grandmother   . Heart disease Paternal Grandmother   . Diabetes Mother   . Cancer Paternal Grandfather        pancreatic  .  Mental illness Brother   . Mental retardation Brother     Social History   Tobacco Use  . Smoking status: Former Games developer  . Smokeless tobacco: Never Used  Vaping Use  . Vaping Use: Never used  Substance Use Topics  . Alcohol use: No  . Drug use: Yes    Types: Marijuana    Home Medications Prior to Admission medications   Medication Sig Start Date End Date Taking? Authorizing Provider  albuterol (VENTOLIN HFA) 108 (90 Base) MCG/ACT inhaler Inhale 1-2 puffs into the lungs every 6 (six) hours as needed for wheezing or shortness of breath. 02/08/20   Caterra Ostroff A, PA-C  ciprofloxacin (CIPRO) 500 MG tablet Take 1 tablet (500 mg total) by mouth 2 (two) times daily. Patient not taking: Reported on 09/20/2019 02/08/19    Brock Bad, MD  clotrimazole (LOTRIMIN) 1 % cream Apply 1 application topically 2 (two) times daily. 09/20/19   Brock Bad, MD  diphenhydrAMINE (BENADRYL) 25 MG tablet Take 2 tablets (50 mg total) by mouth every 6 (six) hours. 12/04/16   Lavera Guise, MD  famotidine (PEPCID) 20 MG tablet Take 1 tablet (20 mg total) by mouth 2 (two) times daily. Patient not taking: Reported on 06/24/2017 12/04/16   Lavera Guise, MD  ferrous sulfate 325 (65 FE) MG tablet Take 1 tablet (325 mg total) by mouth daily with breakfast. Patient not taking: Reported on 11/30/2016 08/20/16   Judeth Horn, NP  fluticasone [facility]) 50 MCG/ACT nasal spray Place 2 sprays into both nostrils daily. 02/08/20   Jhair Witherington A, PA-C  HYDROcodone-acetaminophen (NORCO/VICODIN) 5-325 MG tablet Take 1-2 tablets by mouth every 4 (four) hours as needed for moderate pain (if response to ibuprofen is not sufficient). Patient not taking: Reported on 06/24/2017 12/01/16   Adam Phenix, PA-C  hydrocortisone (ANUSOL-HC) 2.5 % rectal cream Apply rectally 2 times daily Patient not taking: Reported on 06/24/2017 06/23/17   Maxwell Caul, PA-C  hydrocortisone cream 0.5 % Apply 1 application topically 2 (two) times daily.    [provider]  ibuprofen (ADVIL,MOTRIN) 800 MG tablet Take 1 tablet (800 mg total) by mouth every 8 (eight) hours as needed. Patient not taking: Reported on 09/20/2019 05/03/18   Brock Bad, MD  imiquimod [facility]) 5 % cream Apply topically 3 (three) times a week. Patient not taking: Reported on 09/20/2019 06/24/17   Brock Bad, MD  nystatin cream (MYCOSTATIN) Apply to affected area 2 times daily Patient not taking: Reported on 12/04/2016 08/20/16   Judeth Horn, NP  oseltamivir (TAMIFLU) 75 MG capsule Take 1 capsule (75 mg total) by mouth every 12 (twelve) hours. Patient not taking: Reported on 05/03/2018 06/03/17   Fayrene Helper, PA-C  predniSONE (DELTASONE) 50 MG tablet Take 1 tablet (50 mg  total) by mouth daily for 4 days. 02/08/20 02/12/20  Deziya Amero A, PA-C  Prenatal Vit-Fe Fumarate-FA (PREPLUS) 27-1 MG TABS Take 1 tablet by mouth daily. Patient not taking: Reported on 12/04/2016 04/17/16   Hermina Staggers, MD  traMADol (ULTRAM) 50 MG tablet Take 1 tablet (50 mg total) by mouth every 6 (six) hours as needed. Patient not taking: Reported on 06/24/2017 12/04/16   Lavera Guise, MD  triamcinolone cream (KENALOG) 0.1 % Apply 1 application topically 2 (two) times daily. Patient not taking: Reported on 12/04/2016 08/20/16   Judeth Horn, NP    Allergies    Hydrocodone-acetaminophen and Iodine solution [povidone iodine]  Review of Systems  Review of Systems  Constitutional: Negative.   HENT: Negative.   Respiratory: Positive for cough, shortness of breath and wheezing. Negative for apnea, choking, chest tightness and stridor.   Cardiovascular: Negative.   Gastrointestinal: Negative.   Genitourinary: Negative.   Musculoskeletal: Negative.   Skin: Negative.   Neurological: Negative.   All other systems reviewed and are negative.   Physical Exam Updated Vital Signs BP 118/80 (BP Location: Right Arm)   Pulse 71   Temp 98.2 F (36.8 C)   Ht 5' (1.524 m)   Wt 56.7 kg   LMP 02/03/2020   SpO2 100%   BMI 24.41 kg/m   Physical Exam Vitals and nursing note reviewed.  Constitutional:      General: She is not in acute distress.    Appearance: She is not ill-appearing, toxic-appearing or diaphoretic.  HENT:     Head: Normocephalic and atraumatic.     Jaw: There is normal jaw occlusion.     Right Ear: Tympanic membrane, ear canal and external ear normal. There is no impacted cerumen. No hemotympanum. Tympanic membrane is not injected, scarred, perforated, erythematous, retracted or bulging.     Left Ear: Tympanic membrane, ear canal and external ear normal. There is no impacted cerumen. No hemotympanum. Tympanic membrane is not injected, scarred, perforated,  erythematous, retracted or bulging.     Ears:     Comments: No Mastoid tenderness.    Nose:     Comments: Clear rhinorrhea and congestion to bilateral nares.  No sinus tenderness.    Mouth/Throat:     Comments: Posterior oropharynx clear.  Mucous membranes moist.  Tonsils without erythema or exudate.  Uvula midline without deviation.  No evidence of PTA or RPA.  No drooling, dysphasia or trismus.  Phonation normal. Neck:     Trachea: Trachea and phonation normal.     Meningeal: Brudzinski's sign and Kernig's sign absent.     Comments: No Neck stiffness or neck rigidity.  No meningismus.  No cervical lymphadenopathy. Cardiovascular:     Comments: No murmurs rubs or gallops. Pulmonary:     Effort: Pulmonary effort is normal.     Breath sounds: Wheezing present.     Comments: Mild expiratory wheeze throughout.  No accessory muscle usage.  Able speak in full sentences. Abdominal:     Comments: Soft, nontender without rebound or guarding.  No CVA tenderness.  Musculoskeletal:     Comments: Moves all 4 extremities without difficulty.  Lower extremities without edema, erythema or warmth.  Skin:    Comments: Brisk capillary refill.  No rashes or lesions.  Neurological:     Mental Status: She is alert.     Comments: Ambulatory in department without difficulty.  Cranial nerves II through XII grossly intact.  No facial droop.  No aphasia.    ED Results / Procedures / Treatments   Labs (all labs ordered are listed, but only abnormal results are displayed) Labs Reviewed  RESPIRATORY PANEL BY RT PCR (FLU A&B, COVID)    EKG EKG Interpretation  Date/Time:  Wednesday February 08 2020 07:16:12 EDT Ventricular Rate:  73 PR Interval:    QRS Duration: 76 QT Interval:  381 QTC Calculation: 420 R Axis:   59 Text Interpretation: Sinus rhythm Borderline repolarization abnormality Baseline wander in lead(s) II III aVF 12 Lead; Mason-Likar Confirmed by Tilden Fossa [phone]) on 02/08/2020 7:48:49  AM   Radiology DG Chest 2 View  Result Date: 02/08/2020 CLINICAL DATA:  Shortness of breath EXAM: CHEST -  2 VIEW COMPARISON:  2009 FINDINGS: The heart size and mediastinal contours are within normal limits. Both lungs are clear. No pleural effusion or pneumothorax. The visualized skeletal structures are unremarkable. IMPRESSION: No acute process in the chest. Electronically Signed   By: Guadlupe Spanish M.D.   On: 02/08/2020 07:52    Procedures Procedures (including critical care time)  Medications Ordered in ED Medications  albuterol (VENTOLIN HFA) 108 (90 Base) MCG/ACT inhaler 2 puff (2 puffs Inhalation Given 02/08/20 0805)  predniSONE (DELTASONE) tablet 60 mg (60 mg Oral Given 02/08/20 0805)   ED Course  I have reviewed the triage vital signs and the nursing notes.  Pertinent labs & imaging results that were available during my care of the patient were reviewed by me and considered in my medical decision making (see chart for details).  29 year old presents for evaluation of upper respiratory symptoms.  Nonseptic, non-ill-appearing.  This negative for Covid last week.  Started with myalgias, diarrhea.  This resolved has developed cough, wheeze, congestion and rhinorrhea.  No hemoptysis.  Tolerating p.o. intake at home without difficulty.  Heart clear.  Does have some mild expiratory wheeze with productive cough in room.  Clear rhinorrhea and congestion.  Ears no evidence of otitis.  No neck stiffness or neck rigidity.  Low suspicion for meningitis.  Abdomen soft, nontender.  No unilateral leg swelling, redness or warmth.  No clinical evidence of DVT on exam.  She is without tachycardia, tachypnea or hypoxia.  PERC negative, Wells criteria low risk.  Low suspicion for PE or DVT.  She is unvaccinated for Covid and did have exposure 2 weeks ago.  Plan on treating for wheeze, EKG, chest x-ray, Covid test and reassess  Imaging personally reviewed and interpreted:  EKG without STEMI, No  S1Q3T3 Dg chest without cardiomegaly, pulmonary edema, pneumothorax, infiltrates COVID pending  Patient reassessed. Was given steroids and albuterol.  Her wheeze has improved.  Patient states her breathing feels moderately improved and is at baseline.  She is ambulatory in room without any hypoxia.  She has not had any tachycardia, tachypnea or hypoxia. Discussed likely viral upper respiratory infection.  Will treat as such.  She knows she has a pending Covid test.  I discussed return precautions with patient.  Patient voiced understanding is agreeable for follow-up.  I have low suspicion for ACS, PE, dissection, Borhaaves, pneumothorax, bacterial infectious process as cause of her symptoms.  The patient has been appropriately medically screened and/or stabilized in the ED. I have low suspicion for any other emergent medical condition which would require further screening, evaluation or treatment in the ED or require inpatient management.  Patient is hemodynamically stable and in no acute distress.  Patient able to ambulate in department prior to ED.  Evaluation does not show acute pathology that would require ongoing or additional emergent interventions while in the emergency department or further inpatient treatment.  I have discussed the diagnosis with the patient and answered all questions.  Pain is been managed while in the emergency department and patient has no further complaints prior to discharge.  Patient is comfortable with plan discussed in room and is stable for discharge at this time.  I have discussed strict return precautions for returning to the emergency department.  Patient was encouraged to follow-up with PCP/specialist refer to at discharge.    MDM Rules/Calculators/A&P  Renee Suarez was evaluated in Emergency Department on 02/08/2020 for the symptoms described in the history of present illness. She was evaluated in the context of the global COVID-19  pandemic, which necessitated consideration that the patient might be at risk for infection with the SARS-CoV-2 virus that causes COVID-19. Institutional protocols and algorithms that pertain to the evaluation of patients at risk for COVID-19 are in a state of rapid change based on information released by regulatory bodies including the CDC and federal and state organizations. These policies and algorithms were followed during the patient's care in the ED. Final Clinical Impression(s) / ED Diagnoses Final diagnoses:  Viral URI with cough    Rx / DC Orders ED Discharge Orders         Ordered    predniSONE (DELTASONE) 50 MG tablet  Daily        02/08/20 0841    albuterol (VENTOLIN HFA) 108 (90 Base) MCG/ACT inhaler  Every 6 hours PRN        02/08/20 0841    fluticasone (FLONASE) 50 MCG/ACT nasal spray  Daily        02/08/20 0842           Karlei Waldo A, PA-C 02/08/20 0854    Tilden Fossa, MD 02/08/20 [phone]

## 2020-02-08 NOTE — ED Triage Notes (Signed)
Pt states she was tested for Covid (-) last week after developing cough and shob. Now has shob, greenish productive cough with labored resp.

## 2020-02-08 NOTE — Discharge Instructions (Signed)
Take the medications as prescribed.  You will be called if you COVID test is positive  Return for new or worsening symptoms

## 2020-02-09 ENCOUNTER — Telehealth: Payer: Self-pay

## 2020-02-09 NOTE — Telephone Encounter (Signed)
Transition Care Management Unsuccessful Follow-up Telephone Call  Date of discharge and from where:  10/13/20214 from [facility]  Attempts:  1st Attempt  Reason for unsuccessful TCM follow-up call:  Left voice message

## 2020-02-10 NOTE — Telephone Encounter (Signed)
Transition Care Management Unsuccessful Follow-up Telephone Call ° °Date of discharge and from where:  02/08/2020 from [facility] ° °Attempts:  2nd Attempt ° °Reason for unsuccessful TCM follow-up call:  Left voice message ° ° ° ° °

## 2020-02-13 NOTE — Telephone Encounter (Signed)
Transition Care Management Unsuccessful Follow-up Telephone Call  Date of discharge and from where:  02/08/2020 from [facility]  Attempts:  3rd Attempt  Reason for unsuccessful TCM follow-up call:  Patient picked up and we started talking but then the line dropped. I was not able to get patient back on the line.

## 2020-02-22 ENCOUNTER — Ambulatory Visit: Payer: Medicaid Other | Admitting: Internal Medicine

## 2020-02-22 ENCOUNTER — Other Ambulatory Visit: Payer: Self-pay

## 2020-02-22 VITALS — BP 98/64 | HR 68 | Wt 123.7 lb

## 2020-02-22 DIAGNOSIS — Z9049 Acquired absence of other specified parts of digestive tract: Secondary | ICD-10-CM | POA: Diagnosis not present

## 2020-02-22 DIAGNOSIS — R634 Abnormal weight loss: Secondary | ICD-10-CM | POA: Diagnosis not present

## 2020-02-22 NOTE — Progress Notes (Signed)
New Patient Office Visit  Subjective:  Patient ID: Renee Suarez, female    DOB: 1991/01/05  Age: 29 y.o. MRN: [id number]  CC:  Chief Complaint  Patient presents with  . Establish Care  . Headache  . Leg Pain    c/o pain inner left thigh  . Weight Loss    HPI Renee Suarez presents to establish care and concern of unintentional weight loss and loose stools for the past 3 years. Please see problem based charting for further details on today's visit.   Past Medical History:  Diagnosis Date  . Anemia   . Eclampsia    with 1st preg  . GERD (gastroesophageal reflux disease)    post partum 1st preg.  [name] Headache   . Hypertension   . Pregnancy induced hypertension   . Preterm delivery   . Preterm labor   . S/P appendectomy 11/30/2016  . Sickle cell trait [facility])     Past Surgical History:  Procedure Laterality Date  . APPENDECTOMY    . CESAREAN SECTION    . CESAREAN SECTION  04/25/2011   Procedure: CESAREAN SECTION;  Surgeon: Tereso Newcomer, MD;  Location: [facility] ORS;  Service: Gynecology;  Laterality: N/A;  Repeat C/Section  . CESAREAN SECTION WITH BILATERAL TUBAL LIGATION Bilateral 08/06/2016   Procedure: CESAREAN SECTION WITH BILATERAL TUBAL LIGATION;  Surgeon: Reva Bores, MD;  Location: [facility] BIRTHING SUITES;  Service: Obstetrics;  Laterality: Bilateral;  . CHOLECYSTECTOMY    . CHOLECYSTECTOMY  2010  . LAPAROSCOPIC APPENDECTOMY N/A 11/30/2016   Procedure: APPENDECTOMY LAPAROSCOPIC;  Surgeon: Kinsinger, De Blanch, MD;  Location: [facility] ORS;  Service: General;  Laterality: N/A;    Family History  Problem Relation Age of Onset  . Hypertension Paternal Grandmother   . Diabetes Paternal Grandmother   . Heart disease Paternal Grandmother   . Diabetes Mother   . Cancer Paternal Grandfather        pancreatic  . Mental illness Brother   . 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 Not able to do much in terms of physical activity  due to work schedule and childcare. Never smoker. Does not drink EtOH. Admits to occasionally smoking marijuana as of this year.    ROS Review of Systems  Constitutional: Positive for unexpected weight change. Negative for appetite change, chills, fatigue and fever.  HENT: Negative for mouth sores and trouble swallowing.   Eyes: Negative for visual disturbance.  Respiratory: Negative for cough and shortness of breath.   Cardiovascular: Negative for chest pain and palpitations.  Gastrointestinal: Negative for blood in stool, constipation, nausea, rectal pain and vomiting.  Endocrine: Positive for heat intolerance.  Genitourinary: Negative for dysuria and menstrual problem.  Musculoskeletal: Negative for joint swelling.  Skin: Negative for rash.       Endorses hair loss since having her third child   Allergic/Immunologic: Negative for food allergies.  Neurological: Negative for weakness, light-headedness and headaches.  Psychiatric/Behavioral: Negative for sleep disturbance.    Objective:   Today's Vitals: BP 98/64 (BP Location: Left Arm, Patient Position: Sitting, Cuff Size: Normal)   Pulse 68   Wt 123 lb 11.2 oz (56.1 kg)   LMP 02/03/2020   SpO2 100%   BMI 24.16 kg/m   Physical Exam Vitals reviewed.  Constitutional:      General: She is not in acute distress.    Appearance: She is well-developed.  Eyes:  Conjunctiva/sclera: Conjunctivae normal.  Cardiovascular:     Rate and Rhythm: Normal rate and regular rhythm.     Pulses: Normal pulses.     Heart sounds: Normal heart sounds.  Pulmonary:     Effort: Pulmonary effort is normal.     Breath sounds: Normal breath sounds.  Abdominal:     General: A surgical scar is present. Bowel sounds are normal.     Palpations: Abdomen is soft.     Tenderness: There is no abdominal tenderness.  Musculoskeletal:        General: Normal range of motion.     Cervical back: Neck supple.     Right lower leg: No edema.     Left lower  leg: No edema.  Lymphadenopathy:     Cervical: No cervical adenopathy.  Skin:    General: Skin is warm and dry.  Neurological:     General: No focal deficit present.     Mental Status: She is alert.     Gait: Gait normal.  Psychiatric:        Mood and Affect: Mood normal.        Behavior: Behavior normal.     Assessment & Plan:   Problem List Items Addressed This Visit      Other   Unintentional weight loss - Primary    Patient presents with an approximately 45 lb unintentional weight loss since having her third child in 2018. Of note, she is within normal BMI range, but this weight loss is quite distressing to her, especially since she has not been exercising or eating a particular diet to try and lose weight.  She also notes abdominal cramping and urge to defecate anytime she eats. Gets relief after BM. She has not noticed a correlation with certain foods. She had a cholecystectomy 10 years ago, but states symptoms did not begin until approximately 3 years ago as well. She states her stools float and require multiple flushes. She denies abdominal pain except for after eating. She denies fevers, chills, n/v, melena, hematochezia, rectal pain. No family history of inflammatory bowel disease.   Based on clinical presentation, suspect a functional etiology for her chronic loose stools, especially with her history of cholecystectomy. Advised her to try low FODMAPs diet to see if this makes a difference in her symptoms. Will also check CMP, celiac antibody, and TSH to rule out an organic cause for her presentation.  Follow-up in 4 weeks or sooner pending lab findings.        Relevant Orders   CMP14 + Anion Gap   TSH   Celiac Ab tTG DGP TIgA   S/P laparoscopic cholecystectomy      Outpatient Encounter Medications as of 02/22/2020  Medication Sig  . albuterol (VENTOLIN HFA) 108 (90 Base) MCG/ACT inhaler Inhale 1-2 puffs into the lungs every 6 (six) hours as needed for wheezing or  shortness of breath.  . [DISCONTINUED] ciprofloxacin (CIPRO) 500 MG tablet Take 1 tablet (500 mg total) by mouth 2 (two) times daily. (Patient not taking: Reported on 09/20/2019)  . [DISCONTINUED] clotrimazole (LOTRIMIN) 1 % cream Apply 1 application topically 2 (two) times daily.  . [DISCONTINUED] diphenhydrAMINE (BENADRYL) 25 MG tablet Take 2 tablets (50 mg total) by mouth every 6 (six) hours.  . [DISCONTINUED] famotidine (PEPCID) 20 MG tablet Take 1 tablet (20 mg total) by mouth 2 (two) times daily. (Patient not taking: Reported on 06/24/2017)  . [DISCONTINUED] ferrous sulfate 325 (65 FE) MG tablet Take 1 tablet (  325 mg total) by mouth daily with breakfast. (Patient not taking: Reported on 11/30/2016)  . [DISCONTINUED] fluticasone (FLONASE) 50 MCG/ACT nasal spray Place 2 sprays into both nostrils daily.  . [DISCONTINUED] HYDROcodone-acetaminophen (NORCO/VICODIN) 5-325 MG tablet Take 1-2 tablets by mouth every 4 (four) hours as needed for moderate pain (if response to ibuprofen is not sufficient). (Patient not taking: Reported on 06/24/2017)  . [DISCONTINUED] hydrocortisone (ANUSOL-HC) 2.5 % rectal cream Apply rectally 2 times daily (Patient not taking: Reported on 06/24/2017)  . [DISCONTINUED] hydrocortisone cream 0.5 % Apply 1 application topically 2 (two) times daily.  . [DISCONTINUED] ibuprofen (ADVIL,MOTRIN) 800 MG tablet Take 1 tablet (800 mg total) by mouth every 8 (eight) hours as needed. (Patient not taking: Reported on 09/20/2019)  . [DISCONTINUED] imiquimod (ALDARA) 5 % cream Apply topically 3 (three) times a week. (Patient not taking: Reported on 09/20/2019)  . [DISCONTINUED] ipratropium-albuterol (DUONEB) 0.5-2.5 (3) MG/3ML SOLN Take 3 mLs by nebulization every 4 (four) hours as needed.  . [DISCONTINUED] nystatin cream (MYCOSTATIN) Apply to affected area 2 times daily (Patient not taking: Reported on 12/04/2016)  . [DISCONTINUED] oseltamivir (TAMIFLU) 75 MG capsule Take 1 capsule (75 mg total) by  mouth every 12 (twelve) hours. (Patient not taking: Reported on 05/03/2018)  . [DISCONTINUED] Prenatal Vit-Fe Fumarate-FA (PREPLUS) 27-1 MG TABS Take 1 tablet by mouth daily. (Patient not taking: Reported on 12/04/2016)  . [DISCONTINUED] traMADol (ULTRAM) 50 MG tablet Take 1 tablet (50 mg total) by mouth every 6 (six) hours as needed. (Patient not taking: Reported on 06/24/2017)  . [DISCONTINUED] triamcinolone cream (KENALOG) 0.1 % Apply 1 application topically 2 (two) times daily. (Patient not taking: Reported on 12/04/2016)   No facility-administered encounter medications on file as of 02/22/2020.    Follow-up: Return in about 4 weeks (around 03/21/2020) for PCP follow-up on abdominal pain, loose stools  .   Bridget Hartshorn, DO

## 2020-02-22 NOTE — Patient Instructions (Signed)
Renee Suarez,  It was nice meeting you! Glad to have you establishing care in our clinic.   Today we discussed:   Weight loss and loose stools: I am checking some labs and will let you know when I have these results.  In the mean time, I want you to try what is known as a low FODMAP diet (see below) to see if this has any impact on your symptoms.   Let's have you come back in 4 weeks or so to see if any of these changes have made a difference.   Please call sooner if something comes up! [phone]  Take care, Dr. Chesley Mires  Low-FODMAP Eating Plan  FODMAPs (fermentable oligosaccharides, disaccharides, monosaccharides, and polyols) are sugars that are hard for some people to digest. A low-FODMAP eating plan may help some people who have bowel (intestinal) diseases to manage their symptoms. This meal plan can be complicated to follow. Work with a diet and nutrition specialist (dietitian) to make a low-FODMAP eating plan that is right for you. A dietitian can make sure that you get enough nutrition from this diet. What are tips for following this plan? Reading food labels  Check labels for hidden FODMAPs such as: ? High-fructose syrup. ? Honey. ? Agave. ? Natural fruit flavors. ? Onion or garlic powder.  Choose low-FODMAP foods that contain 3-4 grams of fiber per serving.  Check food labels for serving sizes. Eat only one serving at a time to make sure FODMAP levels stay low. Meal planning  Follow a low-FODMAP eating plan for up to 6 weeks, or as told by your health care provider or dietitian.  To follow the eating plan: 1. Eliminate high-FODMAP foods from your diet completely. 2. Gradually reintroduce high-FODMAP foods into your diet one at a time. Most people should wait a few days after introducing one high-FODMAP food before they introduce the next high-FODMAP food. Your dietitian can recommend how quickly you may reintroduce foods. 3. Keep a daily record of what you eat and  drink, and make note of any symptoms that you have after eating. 4. Review your daily record with a dietitian regularly. Your dietitian can help you identify which foods you can eat and which foods you should avoid. General tips  Drink enough fluid each day to keep your urine pale yellow.  Avoid processed foods. These often have added sugar and may be high in FODMAPs.  Avoid most dairy products, whole grains, and sweeteners.  Work with a dietitian to make sure you get enough fiber in your diet. Recommended foods Grains  Gluten-free grains, such as rice, oats, buckwheat, quinoa, corn, polenta, and millet. Gluten-free pasta, bread, or cereal. Rice noodles. Corn tortillas. Vegetables  Eggplant, zucchini, cucumber, peppers, green beans, Brussels sprouts, bean sprouts, lettuce, arugula, kale, Swiss chard, spinach, collard greens, bok choy, summer squash, potato, and tomato. Limited amounts of corn, carrot, and sweet potato. Green parts of scallions. Fruits  Bananas, oranges, lemons, limes, blueberries, raspberries, strawberries, grapes, cantaloupe, honeydew melon, kiwi, papaya, passion fruit, and pineapple. Limited amounts of dried cranberries, banana chips, and shredded coconut. Dairy  Lactose-free milk, yogurt, and kefir. Lactose-free cottage cheese and ice cream. Non-dairy milks, such as almond, coconut, hemp, and rice milk. Yogurts made of non-dairy milks. Limited amounts of goat cheese, brie, mozzarella, parmesan, swiss, and other hard cheeses. Meats and other protein foods  Unseasoned beef, pork, poultry, or fish. Eggs. Tomasa Blase. Tofu (firm) and tempeh. Limited amounts of nuts and seeds, such as almonds, walnuts,  [place] nuts, pecans, peanuts, pumpkin seeds, chia seeds, and sunflower seeds. Fats and oils  Butter-free spreads. Vegetable oils, such as olive, canola, and sunflower oil. Seasoning and other foods  Artificial sweeteners with names that do not end in "ol" such as aspartame,  saccharine, and stevia. Maple syrup, white table sugar, raw sugar, brown sugar, and molasses. Fresh basil, coriander, parsley, rosemary, and thyme. Beverages  Water and mineral water. Sugar-sweetened soft drinks. Small amounts of orange juice or cranberry juice. Black and green tea. Most dry wines. Coffee. This may not be a complete list of low-FODMAP foods. Talk with your dietitian for more information. Foods to avoid Grains  Wheat, including kamut, durum, and semolina. Barley and bulgur. Couscous. Wheat-based cereals. Wheat noodles, bread, crackers, and pastries. Vegetables  Chicory root, artichoke, asparagus, cabbage, snow peas, sugar snap peas, mushrooms, and cauliflower. Onions, garlic, leeks, and the white part of scallions. Fruits  Fresh, dried, and juiced forms of apple, pear, watermelon, peach, plum, cherries, apricots, blackberries, boysenberries, figs, nectarines, and mango. Avocado. Dairy  Milk, yogurt, ice cream, and soft cheese. Cream and sour cream. Milk-based sauces. Custard. Meats and other protein foods  Fried or fatty meat. Sausage. Cashews and pistachios. Soybeans, baked beans, black beans, chickpeas, kidney beans, fava beans, navy beans, lentils, and split peas. Seasoning and other foods  Any sugar-free gum or candy. Foods that contain artificial sweeteners such as sorbitol, mannitol, isomalt, or xylitol. Foods that contain honey, high-fructose corn syrup, or agave. Bouillon, vegetable stock, beef stock, and chicken stock. Garlic and onion powder. Condiments made with onion, such as hummus, chutney, pickles, relish, salad dressing, and salsa. Tomato paste. Beverages  Chicory-based drinks. Coffee substitutes. Chamomile tea. Fennel tea. Sweet or fortified wines such as port or sherry. Diet soft drinks made with isomalt, mannitol, maltitol, sorbitol, or xylitol. Apple, pear, and mango juice. Juices with high-fructose corn syrup. This may not be a complete list of  high-FODMAP foods. Talk with your dietitian to discuss what dietary choices are best for you.  Summary  A low-FODMAP eating plan is a short-term diet that eliminates FODMAPs from your diet to help ease symptoms of certain bowel diseases.  The eating plan usually lasts up to 6 weeks. After that, high-FODMAP foods are restarted gradually, one at a time, so you can find out which may be causing symptoms.  A low-FODMAP eating plan can be complicated. It is best to work with a dietitian who has experience with this type of plan. This information is not intended to replace advice given to you by your health care provider. Make sure you discuss any questions you have with your health care provider. Document Revised: 03/27/2017 Document Reviewed: 12/09/2016 Elsevier Patient Education  2020 [employer].

## 2020-02-23 ENCOUNTER — Encounter: Payer: Self-pay | Admitting: Internal Medicine

## 2020-02-23 DIAGNOSIS — Z9049 Acquired absence of other specified parts of digestive tract: Secondary | ICD-10-CM | POA: Insufficient documentation

## 2020-02-23 DIAGNOSIS — R634 Abnormal weight loss: Secondary | ICD-10-CM | POA: Insufficient documentation

## 2020-02-23 NOTE — Assessment & Plan Note (Signed)
Patient presents with an approximately 45 lb unintentional weight loss since having her third child in 2018. Of note, she is within normal BMI range, but this weight loss is quite distressing to her, especially since she has not been exercising or eating a particular diet to try and lose weight.  She also notes abdominal cramping and urge to defecate anytime she eats. Gets relief after BM. She has not noticed a correlation with certain foods. She had a cholecystectomy 10 years ago, but states symptoms did not begin until approximately 3 years ago as well. She states her stools float and require multiple flushes. She denies abdominal pain except for after eating. She denies fevers, chills, n/v, melena, hematochezia, rectal pain. No family history of inflammatory bowel disease.   Based on clinical presentation, suspect a functional etiology for her chronic loose stools, especially with her history of cholecystectomy. Advised her to try low FODMAPs diet to see if this makes a difference in her symptoms. Will also check CMP, celiac antibody, and TSH to rule out an organic cause for her presentation.  Follow-up in 4 weeks or sooner pending lab findings.

## 2020-02-24 NOTE — Progress Notes (Signed)
Internal Medicine Clinic Attending  Case discussed with Dr. Bloomfield  At the time of the visit.  We reviewed the resident's history and exam and pertinent patient test results.  I agree with the assessment, diagnosis, and plan of care documented in the resident's note.  

## 2020-02-25 LAB — CELIAC AB TTG DGP TIGA
Antigliadin Abs, IgA: 3 units (ref 0–19)
Gliadin IgG: 2 units (ref 0–19)
IgA/Immunoglobulin A, Serum: 163 mg/dL (ref 87–352)
Tissue Transglut Ab: <2 U/mL (ref 0–5)
Transglutaminase IgA: <2 U/mL (ref 0–3)

## 2020-02-25 LAB — CMP14 + ANION GAP
ALT: 7 IU/L (ref 0–32)
AST: 10 IU/L (ref 0–40)
Albumin/Globulin Ratio: 1.8 (ref 1.2–2.2)
Albumin: 4.1 g/dL (ref 3.9–5.0)
Alkaline Phosphatase: 38 IU/L — ABNORMAL LOW (ref 44–121)
Anion Gap: 12 mmol/L (ref 10.0–18.0)
BUN/Creatinine Ratio: 17 (ref 9–23)
BUN: 12 mg/dL (ref 6–20)
Bilirubin Total: 0.7 mg/dL (ref 0.0–1.2)
CO2: 22 mmol/L (ref 20–29)
Calcium: 9.2 mg/dL (ref 8.7–10.2)
Chloride: 107 mmol/L — ABNORMAL HIGH (ref 96–106)
Creatinine, Ser: 0.72 mg/dL (ref 0.57–1.00)
GFR calc Af Amer: 130 mL/min/{1.73_m2} (ref >59)
GFR calc non Af Amer: 113 mL/min/{1.73_m2} (ref >59)
Globulin, Total: 2.3 g/dL (ref 1.5–4.5)
Glucose: 88 mg/dL (ref 65–99)
Potassium: 4.3 mmol/L (ref 3.5–5.2)
Sodium: 141 mmol/L (ref 134–144)
Total Protein: 6.4 g/dL (ref 6.0–8.5)

## 2020-02-25 LAB — TSH: TSH: 2.12 u[IU]/mL (ref 0.450–4.500)

## 2020-02-27 ENCOUNTER — Telehealth: Payer: Self-pay

## 2020-02-27 NOTE — Telephone Encounter (Signed)
Requesting lab results, please call pt back.  

## 2020-02-28 NOTE — Telephone Encounter (Signed)
Contacted patient regarding results, relatively unremarkable. Has follow up on Nov 23 to discuss next steps.

## 2020-03-20 ENCOUNTER — Telehealth: Payer: Self-pay

## 2020-03-20 ENCOUNTER — Encounter: Payer: Medicaid Other | Admitting: Internal Medicine

## 2020-03-20 NOTE — Progress Notes (Deleted)
   CC: Unintentional weight loss  HPI:  Ms.Leontine N Zeiders is a 29 y.o. with a PMHx listed below presenting for follow up of unintentional weight loss. For details of today's visit and the status of her chronic medical issues please refer to the assessment and plan.   Past Medical History:  Diagnosis Date  . Anemia   . Eclampsia    with 1st preg  . GERD (gastroesophageal reflux disease)    post partum 1st preg.  [name] Headache   . Hypertension   . Pregnancy induced hypertension   . Preterm delivery   . Preterm labor   . S/P appendectomy 11/30/2016  . Sickle cell trait ([facility])    Review of Systems:   ROS   Physical Exam:  There were no vitals filed for this visit.  Physical Exam  Assessment & Plan:   See Encounters Tab for problem based charting.  Patient {GC/GE:3044014::"discussed with","seen with"} Dr. {NAMES:3044014::"Butcher","Guilloud","Hoffman","Mullen","Narendra","Raines","Vincent"}

## 2020-03-20 NOTE — Assessment & Plan Note (Deleted)
Celiac panel negative. TSH wnl. CMP unremarkable.

## 2020-03-20 NOTE — Telephone Encounter (Signed)
Patient was a no-show for her appointment with Dr. Karilyn Cota this morning at 1045.  TC to patient and she states she forgot about her appointment.  Appointment was rescheduled to Monday, 03/26/20 @ 1:45 with Dr. Karilyn Cota.  Patient very appreciative. SChaplin, RN,BSN

## 2020-03-26 ENCOUNTER — Encounter: Payer: Medicaid Other | Admitting: Internal Medicine

## 2020-03-26 NOTE — Progress Notes (Deleted)
   CC: ***  HPI:  [name] is a 29 y.o.   Past Medical History:  Diagnosis Date  . Anemia   . Eclampsia    with 1st preg  . GERD (gastroesophageal reflux disease)    post partum 1st preg.  [name] Headache   . Hypertension   . Pregnancy induced hypertension   . Preterm delivery   . Preterm labor   . S/P appendectomy 11/30/2016  . Sickle cell trait ([facility]CC)    Review of Systems:  ***  Physical Exam:  There were no vitals filed for this visit. ***  Assessment & Plan:   See Encounters Tab for problem based charting.  Patient {GC/GE:3044014::"discussed with","seen with"} Dr. {NAMES:3044014::"Butcher","Guilloud","Hoffman","Mullen","Narendra","Raines","Vincent"}

## 2020-09-06 ENCOUNTER — Encounter (HOSPITAL_COMMUNITY): Payer: Self-pay

## 2020-09-06 ENCOUNTER — Emergency Department (HOSPITAL_COMMUNITY)
Admission: EM | Admit: 2020-09-06 | Discharge: 2020-09-06 | Disposition: A | Discharge status: Home / Self Care | Payer: Medicaid Other | Attending: Emergency Medicine | Admitting: Emergency Medicine

## 2020-09-06 ENCOUNTER — Emergency Department (HOSPITAL_COMMUNITY): Payer: Medicaid Other

## 2020-09-06 DIAGNOSIS — R3915 Urgency of urination: Secondary | ICD-10-CM | POA: Insufficient documentation

## 2020-09-06 DIAGNOSIS — R350 Frequency of micturition: Secondary | ICD-10-CM | POA: Diagnosis not present

## 2020-09-06 DIAGNOSIS — R6883 Chills (without fever): Secondary | ICD-10-CM | POA: Insufficient documentation

## 2020-09-06 DIAGNOSIS — K219 Gastro-esophageal reflux disease without esophagitis: Secondary | ICD-10-CM | POA: Insufficient documentation

## 2020-09-06 DIAGNOSIS — I10 Essential (primary) hypertension: Secondary | ICD-10-CM | POA: Insufficient documentation

## 2020-09-06 DIAGNOSIS — R1011 Right upper quadrant pain: Secondary | ICD-10-CM | POA: Insufficient documentation

## 2020-09-06 DIAGNOSIS — N12 Tubulo-interstitial nephritis, not specified as acute or chronic: Secondary | ICD-10-CM

## 2020-09-06 DIAGNOSIS — Z87891 Personal history of nicotine dependence: Secondary | ICD-10-CM | POA: Insufficient documentation

## 2020-09-06 DIAGNOSIS — R1031 Right lower quadrant pain: Secondary | ICD-10-CM | POA: Diagnosis not present

## 2020-09-06 LAB — URINALYSIS, ROUTINE W REFLEX MICROSCOPIC
Bilirubin Urine: NEGATIVE
Glucose, UA: NEGATIVE mg/dL
Ketones, ur: NEGATIVE mg/dL
Leukocytes,Ua: NEGATIVE
Nitrite: NEGATIVE
Protein, ur: NEGATIVE mg/dL
Specific Gravity, Urine: 1.01 (ref 1.005–1.030)
pH: 7 (ref 5.0–8.0)

## 2020-09-06 LAB — COMPREHENSIVE METABOLIC PANEL
ALT: 9 U/L (ref 0–44)
AST: 12 U/L — ABNORMAL LOW (ref 15–41)
Albumin: 3.4 g/dL — ABNORMAL LOW (ref 3.5–5.0)
Alkaline Phosphatase: 33 U/L — ABNORMAL LOW (ref 38–126)
Anion gap: 5 (ref 5–15)
BUN: 8 mg/dL (ref 6–20)
CO2: 27 mmol/L (ref 22–32)
Calcium: 9 mg/dL (ref 8.9–10.3)
Chloride: 104 mmol/L (ref 98–111)
Creatinine, Ser: 0.63 mg/dL (ref 0.44–1.00)
GFR, Estimated: >60 mL/min (ref >60)
Glucose, Bld: 96 mg/dL (ref 70–99)
Potassium: 3.7 mmol/L (ref 3.5–5.1)
Sodium: 136 mmol/L (ref 135–145)
Total Bilirubin: 0.8 mg/dL (ref 0.3–1.2)
Total Protein: 7.3 g/dL (ref 6.5–8.1)

## 2020-09-06 LAB — CBC WITH DIFFERENTIAL/PLATELET
Abs Immature Granulocytes: 0.02 10*3/uL (ref 0.00–0.07)
Basophils Absolute: 0 10*3/uL (ref 0.0–0.1)
Basophils Relative: 0 %
Eosinophils Absolute: 0.1 10*3/uL (ref 0.0–0.5)
Eosinophils Relative: 1 %
HCT: 35.3 % — ABNORMAL LOW (ref 36.0–46.0)
Hemoglobin: 11.8 g/dL — ABNORMAL LOW (ref 12.0–15.0)
Immature Granulocytes: 0 %
Lymphocytes Relative: 13 %
Lymphs Abs: 1.3 10*3/uL (ref 0.7–4.0)
MCH: 26.5 pg (ref 26.0–34.0)
MCHC: 33.4 g/dL (ref 30.0–36.0)
MCV: 79.3 fL — ABNORMAL LOW (ref 80.0–100.0)
Monocytes Absolute: 0.9 10*3/uL (ref 0.1–1.0)
Monocytes Relative: 9 %
Neutro Abs: 7.7 10*3/uL (ref 1.7–7.7)
Neutrophils Relative %: 77 %
Platelets: 223 10*3/uL (ref 150–400)
RBC: 4.45 MIL/uL (ref 3.87–5.11)
RDW: 12.6 % (ref 11.5–15.5)
WBC: 10 10*3/uL (ref 4.0–10.5)
nRBC: 0 % (ref 0.0–0.2)

## 2020-09-06 LAB — I-STAT BETA HCG BLOOD, ED (MC, WL, AP ONLY): I-stat hCG, quantitative: <5 m[IU]/mL (ref <5)

## 2020-09-06 LAB — LIPASE, BLOOD: Lipase: 25 U/L (ref 11–51)

## 2020-09-06 MED ORDER — KETOROLAC TROMETHAMINE 30 MG/ML IJ SOLN
15.0000 mg | Freq: Once | INTRAMUSCULAR | Status: AC
  Administered 2020-09-06: 15 mg via INTRAVENOUS
  Filled 2020-09-06: qty 1

## 2020-09-06 MED ORDER — HYDROMORPHONE HCL 1 MG/ML IJ SOLN
0.5000 mg | Freq: Once | INTRAMUSCULAR | Status: AC
Start: 2020-09-06 — End: 2020-09-06
  Administered 2020-09-06: 0.5 mg via INTRAVENOUS
  Filled 2020-09-06: qty 1

## 2020-09-06 MED ORDER — SODIUM CHLORIDE 0.9 % IV SOLN
1.0000 g | Freq: Once | INTRAVENOUS | Status: AC
  Administered 2020-09-06: 1 g via INTRAVENOUS
  Filled 2020-09-06: qty 10

## 2020-09-06 MED ORDER — CIPROFLOXACIN HCL 500 MG PO TABS: 500.0000 mg | ORAL_TABLET | Freq: Two times a day (BID) | ORAL | 0 refills | Status: DC

## 2020-09-06 MED ORDER — OXYCODONE-ACETAMINOPHEN 5-325 MG PO TABS: 1.0000 | ORAL_TABLET | Freq: Four times a day (QID) | ORAL | 0 refills | Status: DC | PRN

## 2020-09-06 MED ORDER — FENTANYL CITRATE (PF) 100 MCG/2ML IJ SOLN
100.0000 ug | Freq: Once | INTRAMUSCULAR | Status: AC
  Administered 2020-09-06: 100 ug via INTRAVENOUS
  Filled 2020-09-06: qty 2

## 2020-09-06 MED ORDER — ONDANSETRON HCL 4 MG/2ML IJ SOLN
4.0000 mg | Freq: Once | INTRAMUSCULAR | Status: AC
  Administered 2020-09-06: 4 mg via INTRAVENOUS
  Filled 2020-09-06: qty 2

## 2020-09-06 MED ORDER — SODIUM CHLORIDE 0.9 % IV BOLUS
1000.0000 mL | Freq: Once | INTRAVENOUS | Status: AC
  Administered 2020-09-06: 1000 mL via INTRAVENOUS

## 2020-09-06 MED ORDER — HYDROMORPHONE HCL 1 MG/ML IJ SOLN
1.0000 mg | Freq: Once | INTRAMUSCULAR | Status: AC
  Administered 2020-09-06: 1 mg via INTRAVENOUS
  Filled 2020-09-06: qty 1

## 2020-09-06 MED ORDER — FENTANYL CITRATE (PF) 100 MCG/2ML IJ SOLN
50.0000 ug | Freq: Once | INTRAMUSCULAR | Status: AC
Start: 2020-09-06 — End: 2020-09-06
  Administered 2020-09-06: 50 ug via INTRAVENOUS
  Filled 2020-09-06: qty 2

## 2020-09-06 NOTE — ED Triage Notes (Signed)
Pt presents with c/o flank pain on the right side that started approx one week ago. Pt reports that she also has pain with urination that started 2 days ago.

## 2020-09-06 NOTE — Discharge Instructions (Addendum)
You are seen in the emergency room today for your right flank pain and urinary symptoms.  There were no kidney stones identified on your CT scan, though your urine test revealed concern for infection and your CT scan showed some inflammation of your right kidney.  You were administered IV antibiotics in the emergency department, and have been discharged with a week of antibiotics to take at home.  You also been provided a prescription for pain pills to take as needed for severe pain.  You may take ibuprofen in addition to this medication, but should avoid taking additional Tylenol.  Please follow-up with your primary care doctor in 1 week for recheck of your urine.  Return to the emergency department if you develop any worsening abdominal pain, nausea or vomiting does not stop, fevers and chills despite antibiotic, or any other new severe symptoms.

## 2020-09-06 NOTE — ED Notes (Addendum)
Discharge paperwork reviewed with pt, including prescriptions. Pt with no questions or concerns voiced at time of discharge. Ambulatory to ED exit. Pt reports she has a ride to come pick her up.

## 2020-09-06 NOTE — ED Provider Notes (Signed)
[facility]   CSN: [id number] Arrival date & time: 09/06/20  1638     History Chief Complaint  Patient presents with  . Flank Pain    Renee Suarez is a 30 y.o. female who presents with concern for 1 week of progressively worsening right flank pain and is exacerbated with urination.  Pain is sharp in nature and radiates down into the right lower groin.  Urine is dark in color and decreased in quantity.  Denies any dysuria but does endorse urinary frequency and urgency.  Denies any history of renal stones.  Endorses chills but denies fevers.  Pain not relieved by Tylenol but mildly relieved by oxycodone given to her by her mother who has recently undergone surgery.  I personally reviewed this patient's medical records.  She is history of hypertension in the context of pregnancy and anemia.  She is not on any medications daily.  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  HPI     Past Medical History:  Diagnosis Date  . Anemia   . Eclampsia    with 1st preg  . GERD (gastroesophageal reflux disease)    post partum 1st preg.  [name] Headache   . Hypertension   . Pregnancy induced hypertension   . Preterm delivery   . Preterm labor   . S/P appendectomy 11/30/2016  . Sickle cell trait [facility])     Patient Active Problem List   Diagnosis Date Noted  . Unintentional weight loss 02/23/2020  . S/P laparoscopic cholecystectomy 02/23/2020  . S/P appendectomy 11/30/2016  . Postpartum care following cesarean delivery 08/06/2016  . Sickle cell trait (HCC) 01/30/2016  . History of cesarean delivery, antepartum 01/22/2016    Past Surgical History:  Procedure Laterality Date  . APPENDECTOMY    . CESAREAN SECTION    . CESAREAN SECTION  04/25/2011   Procedure: CESAREAN SECTION;  Surgeon: Tereso Newcomer, MD;  Location: [facility];  Service: Gynecology;  Laterality: N/A;  Repeat C/Section  . CESAREAN  SECTION WITH BILATERAL TUBAL LIGATION Bilateral 08/06/2016   Procedure: CESAREAN SECTION WITH BILATERAL TUBAL LIGATION;  Surgeon: Reva Bores, MD;  Location: [facility];  Service: Obstetrics;  Laterality: Bilateral;  . CHOLECYSTECTOMY    . CHOLECYSTECTOMY  2010  . LAPAROSCOPIC APPENDECTOMY N/A 11/30/2016   Procedure: APPENDECTOMY LAPAROSCOPIC;  Surgeon: Rodman Pickle, MD;  Location: [facility];  Service: General;  Laterality: N/A;     OB History    Gravida  4   Para  3   Term  2   Preterm  1   AB  1   Living  3     SAB  0   IAB  1   Ectopic  0   Multiple  0   Live Births  3           Family History  Problem Relation Age of Onset  . Hypertension Paternal Grandmother   . Diabetes Paternal Grandmother   . Heart disease Paternal Grandmother   . Diabetes Mother   . Cancer Paternal Grandfather        pancreatic  . Mental illness Brother   . Mental retardation Brother     Social History   Tobacco Use  . Smoking status: Former Games developer  . Smokeless tobacco: Never Used  Vaping Use  . Vaping Use: Never used  Substance Use Topics  . Alcohol use: No  .  Drug use: Yes    Types: Marijuana    Home Medications Prior to Admission medications   Medication Sig Start Date End Date Taking? Authorizing Provider  albuterol (VENTOLIN HFA) 108 (90 Base) MCG/ACT inhaler Inhale 1-2 puffs into the lungs every 6 (six) hours as needed for wheezing or shortness of breath. 02/08/20   Henderly, Britni A, PA-C    Allergies    Hydrocodone-acetaminophen and Iodine solution [povidone iodine]  Review of Systems   Review of Systems  Constitutional: Positive for chills. Negative for activity change, appetite change, diaphoresis, fatigue, fever and unexpected weight change.  HENT: Negative.   Respiratory: Negative.   Cardiovascular: Negative.   Gastrointestinal: Positive for abdominal pain and nausea. Negative for blood in stool, constipation, diarrhea and vomiting.   Genitourinary: Positive for decreased urine volume, flank pain, frequency and urgency. Negative for difficulty urinating, dysuria, enuresis, genital sores, hematuria, menstrual problem, pelvic pain, vaginal bleeding, vaginal discharge and vaginal pain.  Skin: Negative.   Allergic/Immunologic: Negative.   Neurological: Negative.   Hematological: Negative.     Physical Exam Updated Vital Signs BP 100/66 (BP Location: Left Arm)   Pulse 78   Temp 98.8 F (37.1 C) (Oral)   Resp 18   LMP 08/30/2020 (Approximate)   SpO2 100%   Physical Exam Vitals and nursing note reviewed.  Constitutional:      Appearance: She is normal weight. She is not ill-appearing or toxic-appearing.  HENT:     Head: Normocephalic and atraumatic.     Nose: Nose normal.     Mouth/Throat:     Mouth: Mucous membranes are moist.     Pharynx: Oropharynx is clear. Uvula midline. No oropharyngeal exudate, posterior oropharyngeal erythema or uvula swelling.     Tonsils: No tonsillar exudate.  Eyes:     General: Lids are normal. Vision grossly intact.        Right eye: No discharge.        Left eye: No discharge.     Conjunctiva/sclera: Conjunctivae normal.     Pupils: Pupils are equal, round, and reactive to light.  Neck:     Trachea: Trachea and phonation normal.  Cardiovascular:     Rate and Rhythm: Normal rate and regular rhythm.     Pulses: Normal pulses.     Heart sounds: Normal heart sounds. No murmur heard.   Pulmonary:     Effort: Pulmonary effort is normal. No tachypnea, bradypnea, accessory muscle usage, prolonged expiration or respiratory distress.     Breath sounds: Normal breath sounds. No wheezing or rales.  Chest:     Chest wall: No mass, lacerations, deformity, swelling, tenderness, crepitus or edema.  Abdominal:     General: Bowel sounds are normal. There is no distension.     Palpations: Abdomen is soft.     Tenderness: There is abdominal tenderness in the right upper quadrant, right lower  quadrant and suprapubic area. There is right CVA tenderness. There is no left CVA tenderness, guarding or rebound. Negative signs include Murphy's sign, Rovsing's sign, McBurney's sign, psoas sign and obturator sign.  Musculoskeletal:        General: No deformity.     Cervical back: Normal range of motion and neck supple. No rigidity or crepitus. No pain with movement, spinous process tenderness or muscular tenderness.     Right lower leg: No edema.     Left lower leg: No edema.  Lymphadenopathy:     Cervical: No cervical adenopathy.  Skin:  General: Skin is warm and dry.  Neurological:     General: No focal deficit present.     Mental Status: She is alert and oriented to person, place, and time. Mental status is at baseline.     Gait: Gait is intact.  Psychiatric:        Mood and Affect: Mood normal.     ED Results / Procedures / Treatments   Labs (all labs ordered are listed, but only abnormal results are displayed) Labs Reviewed  COMPREHENSIVE METABOLIC PANEL - Abnormal; Notable for the following components:      Result Value   Albumin 3.4 (*)    AST 12 (*)    Alkaline Phosphatase 33 (*)    All other components within normal limits  CBC WITH DIFFERENTIAL/PLATELET - Abnormal; Notable for the following components:   Hemoglobin 11.8 (*)    HCT 35.3 (*)    MCV 79.3 (*)    All other components within normal limits  URINALYSIS, ROUTINE W REFLEX MICROSCOPIC - Abnormal; Notable for the following components:   Hgb urine dipstick SMALL (*)    Bacteria, UA MANY (*)    All other components within normal limits  URINE CULTURE  LIPASE, BLOOD  I-STAT BETA HCG BLOOD, ED ([facility], [facility], [facility] ONLY)    EKG None  Radiology CT Renal Stone Study  Result Date: 09/06/2020 CLINICAL DATA:  Right flank pain EXAM: CT ABDOMEN AND PELVIS WITHOUT CONTRAST TECHNIQUE: Multidetector CT imaging of the abdomen and pelvis was performed following the standard protocol without IV contrast. COMPARISON:   11/30/2016 FINDINGS: Lower chest:  No contributory findings. Hepatobiliary: No focal liver abnormality.Cholecystectomy. Pancreas: Unremarkable. Spleen: Unremarkable. Adrenals/Urinary Tract: Negative adrenals. No hydronephrosis or stone. On reformats there is question of subtle right perinephric stranding. No evident collection. Unremarkable bladder. Stomach/Bowel: No obstruction. Appendectomy. No visible bowel inflammation. No generalized stool retention Vascular/Lymphatic: No acute vascular abnormality. No mass or adenopathy. Reproductive:No pathologic findings.  Tubal ligation clips. Other: Trace pelvic fluid, usually physiologic. Musculoskeletal: No acute abnormalities. IMPRESSION: Equivocal right perinephric stranding without hydronephrosis or ureteral stone, are there symptoms of pyelonephritis? Electronically Signed   By: Marnee SpringJonathon  Watts M.D.   On: 09/06/2020 10:42    Procedures Procedures   Medications Ordered in ED Medications  fentaNYL (SUBLIMAZE) injection 50 mcg (50 mcg Intravenous Given 09/06/20 0938)  ondansetron (ZOFRAN) injection 4 mg (4 mg Intravenous Given 09/06/20 0938)  sodium chloride 0.9 % bolus 1,000 mL (0 mLs Intravenous Stopped 09/06/20 1039)  fentaNYL (SUBLIMAZE) injection 100 mcg (100 mcg Intravenous Given 09/06/20 1035)  ketorolac (TORADOL) 30 MG/ML injection 15 mg (15 mg Intravenous Given 09/06/20 1034)  cefTRIAXone (ROCEPHIN) 1 g in sodium chloride 0.9 % 100 mL IVPB (1 g Intravenous New Bag/Given 09/06/20 1129)  HYDROmorphone (DILAUDID) injection 0.5 mg (0.5 mg Intravenous Given 09/06/20 1222)    ED Course  I have reviewed the triage vital signs and the nursing notes.  Pertinent labs & imaging results that were available during my care of the patient were reviewed by me and considered in my medical decision making (see chart for details).    MDM Rules/Calculators/A&P                         30 year old female presents with concern for 1 week of right-sided flank pain  and urinary symptoms, no history of renal stones.  Differential diagnosis includes but is not limited to nephrolithiasis/ureterolithiasis, pyelonephritis, urinary tract infection, renal artery embolism, AAA, cholecystitis  cholangitis, biliary colic, pancreatitis, appendicitis, diverticulitis.  Vital signs are normal intake.  Cardiopulmonary exam is normal, abdominal exam significant for mild right upper and right lower quadrant tenderness to palpation with right CVA tenderness.  Mild suprapubic tenderness to palpation.  Patient is neurovascularly intact in all 4 extremities.  Analgesia and antiemetic offered. CBC with mild anemia with hemoglobin of 11.8, patient's baseline is 12. CMP unremarkable, lipase is normal.  UA with small amount of hemoglobin but many bacteria.  CT renal study negative for hydronephrosis, nephrolithiasis or ureterolithiasis, however there is equivocal right perinephric stranding possibly suggestive of pyelonephritis.  Given context of flank pain, significant bacteria in UA, and perinephric stranding on CT we will proceed with treatment for pyelonephritis.  We will proceed with IV Rocephin administration in the ED and plan for discharge home with oral antibiotics outpatient.  Patient reevaluated with significant improvement in her pain after administration of Dilaudid.  She is completed her IV antibiotics at this time and is feeling more comfortable.  She is safe to be discharged home.  No further work-up warranted in the ED.  Will discharge with diagnosis of pyelonephritis with oral antibiotic therapy outpatient.  Dewey voiced understanding of her medical evaluation and treatment plan.  Each of her questions was answered to her expressed satisfaction.  Return precautions given.  Patient is stable, and appropriate for discharge at this time.  This chart was dictated using voice recognition software, Dragon. Despite the best efforts of this provider to proofread and correct  errors, errors may still occur which can change documentation meaning.  Final Clinical Impression(s) / ED Diagnoses Final diagnoses:  None    Rx / DC Orders ED Discharge Orders    None       Sherrilee Gilles 09/06/20 1342    Pollyann Savoy, MD 09/06/20 1351

## 2020-09-07 ENCOUNTER — Telehealth: Payer: Self-pay

## 2020-09-07 NOTE — Telephone Encounter (Signed)
Transition Care Management Unsuccessful Follow-up Telephone Call  Date of discharge and from where:  09/06/2020 from   Attempts:  1st Attempt  Reason for unsuccessful TCM follow-up call:  Left voice message     

## 2020-09-08 LAB — URINE CULTURE: Culture: >=100000 — AB

## 2020-09-09 ENCOUNTER — Telehealth: Payer: Self-pay | Admitting: Emergency Medicine

## 2020-09-09 NOTE — Telephone Encounter (Signed)
Post ED Visit - Positive Culture Follow-up  Culture report reviewed by antimicrobial stewardship pharmacist: [facility] []  , Pharm.D. []  Enzo Bi, Pharm.D., BCPS AQ-ID []  , Pharm.D., BCPS []  Celedonio Miyamoto, Pharm.D., BCPS []  [place], Garvin Fila.D., BCPS, AAHIVP []  , Pharm.D., BCPS, AAHIVP []  Georgina Pillion, PharmD, BCPS []  , PharmD, BCPS []  [place], PharmD, BCPS []  [address], PharmD []  , PharmD, BCPS []  Estella Husk, PharmD  Pharmacy Team []  Lysle Pearl, PharmD []  , PharmD []  Phillips Climes, PharmD []  , Rph []  Agapito Games) , PharmD []  Verlan Friends, PharmD []  , PharmD [x]  Mervyn Gay, PharmD []  , PharmD []  Vinnie Level, PharmD []  [facility], PharmD []  , PharmD []  Len Childs, PharmD   Positive urine culture Treated with Ciprofloxacin, organism sensitive to the same and no further patient follow-up is required at this time.  Micha Erck 09/09/2020, 10:48 AM

## 2020-09-10 NOTE — Telephone Encounter (Signed)
Transition Care Management Unsuccessful Follow-up Telephone Call  Date of discharge and from where:  09/06/2020 from [facility]  Attempts:  2nd Attempt  Reason for unsuccessful TCM follow-up call:  Left voice message

## 2020-09-11 ENCOUNTER — Telehealth: Payer: Self-pay

## 2020-09-11 NOTE — Telephone Encounter (Signed)
Transition Care Management Follow-up Telephone Call  Date of discharge and from where: 09/06/20 from [facility]  How have you been since you were released from the hospital? Pt stated that she is having difficulty keeping the abx down. Pt stated that it is upsetting her stomach and is reaching out to PCP to see if she needs a different one.   Any questions or concerns? No  Items Reviewed:  Did the pt receive and understand the discharge instructions provided? Yes   Medications obtained and verified? Yes   Other? No   Any new allergies since your discharge? No   Dietary orders reviewed? N/a  Do you have support at home? Yes   Functional Questionnaire: (I = Independent and D = Dependent) ADLs: I  Bathing/Dressing- I  Meal Prep- I  Eating- I  Maintaining continence- I  Transferring/Ambulation- I  Managing Meds- I   Follow up appointments reviewed:   PCP Hospital f/u appt confirmed? No    Specialist Hospital f/u appt confirmed? No    Are transportation arrangements needed? No   If their condition worsens, is the pt aware to call PCP or go to the Emergency Dept.? Yes  Was the patient provided with contact information for the PCP's office or ED? Yes  Was to pt encouraged to call back with questions or concerns? Yes

## 2020-09-11 NOTE — Telephone Encounter (Signed)
Return call to pt's mother, Maxine Glenn - stated pt went to th ED yesterday,dx with kidney infection and she given abx to takes. According to chart, it's Cipro. Stated pt is unable to tolerate ; "too strong"' c/o nausea after taking. Advised not to take with milk.  First available appt scheduled for tomorrow @ 1515 PM with Dr Gwyneth Revels.

## 2020-09-11 NOTE — Telephone Encounter (Signed)
Pt mom is requesting a call back . She stated pt went to the ER  For pain  She was given medicine but the medicine is to strong and causing pt to vomit and more pain . Was going to give her an appt for 5/19/@ 9:15  But she stated that is to far out

## 2020-09-12 ENCOUNTER — Ambulatory Visit (INDEPENDENT_AMBULATORY_CARE_PROVIDER_SITE_OTHER): Payer: Medicaid Other | Admitting: Internal Medicine

## 2020-09-12 ENCOUNTER — Encounter: Payer: Self-pay | Admitting: Internal Medicine

## 2020-09-12 ENCOUNTER — Other Ambulatory Visit: Payer: Self-pay

## 2020-09-12 DIAGNOSIS — N12 Tubulo-interstitial nephritis, not specified as acute or chronic: Secondary | ICD-10-CM

## 2020-09-12 MED ORDER — MELOXICAM 7.5 MG PO TABS: 7.5000 mg | ORAL_TABLET | Freq: Every day | ORAL | 0 refills | Status: DC

## 2020-09-12 MED ORDER — CEPHALEXIN 500 MG PO CAPS: 500.0000 mg | ORAL_CAPSULE | Freq: Four times a day (QID) | ORAL | 0 refills | Status: AC

## 2020-09-12 NOTE — Patient Instructions (Signed)
Ms. Renee Suarez,  It was a pleasure to see you today. Thank you for coming in.   Today we discussed your kidney infection. I am sorry that you are still feeling poorly. I have sent in a new antibiotic that should treat the infection. I also sent in some pain medications that you can take daily, you can also continue the tylenol as needed. If you are not able to tolerate a diet or fluid intake please contact us, you may need to be admitted to receive IV antibiotics. Please get plenty of rest and stay well hydrated.   Please return to clinic as needed.   Thank you again for coming in.   Claudean Severance.D.

## 2020-09-12 NOTE — Progress Notes (Signed)
   CC: Abdominal pain, flank pain, recent ED visit for pyelonephritis  HPI:  [name] is a 30 y.o. with history of sickle cell trait who is for follow-up of her recent ED visit for pyelonephritis, abdominal pain, and flank pain.  Past Medical History:  Diagnosis Date  . Anemia   . Eclampsia    with 1st preg  . GERD (gastroesophageal reflux disease)    post partum 1st preg.  [name] Headache   . Hypertension   . Pregnancy induced hypertension   . Preterm delivery   . Preterm labor   . S/P appendectomy 11/30/2016  . Sickle cell trait (HCC)    Review of Systems:   Constitutional: Negative for fever.  Positive for chills. Respiratory: Negative for shortness of breath.   Cardiovascular: Negative for chest pain and leg swelling.  Gastrointestinal: Positive for abdominal and flank pain, positive for nausea, negative for vomiting. Neurological: Negative for dizziness and headaches.    Physical Exam:  Vitals:   09/12/20 1521  BP: 104/66  Pulse: 78  Temp: 98.5 F (36.9 C)  TempSrc: Oral  SpO2: 100%  Weight: 131 lb 4.8 oz (59.6 kg)  Height: 5' (1.524 m)   Physical Exam Constitutional:      Appearance: Normal appearance.  Cardiovascular:     Rate and Rhythm: Normal rate and regular rhythm.     Pulses: Normal pulses.     Heart sounds: Normal heart sounds.  Pulmonary:     Effort: Pulmonary effort is normal.     Breath sounds: Normal breath sounds.  Abdominal:     General: Abdomen is flat. Bowel sounds are normal. There is no distension.     Palpations: Abdomen is soft.     Tenderness: There is abdominal tenderness (RLQ TTP). There is right CVA tenderness. There is no guarding or rebound.  Skin:    General: Skin is warm and dry.     Capillary Refill: Capillary refill takes less than 2 seconds.  Neurological:     General: No focal deficit present.     Mental Status: She is alert and oriented to person, place, and time.  Psychiatric:        Mood and Affect: Mood  normal.        Behavior: Behavior normal.      Assessment & Plan:   See Encounters Tab for problem based charting.  Patient discussed with Dr. Oswaldo Done

## 2020-09-13 NOTE — Progress Notes (Signed)
Internal Medicine Clinic Attending ° °Case discussed with Dr. Krienke  At the time of the visit.  We reviewed the resident’s history and exam and pertinent patient test results.  I agree with the assessment, diagnosis, and plan of care documented in the resident’s note.  °

## 2020-09-13 NOTE — Assessment & Plan Note (Addendum)
Patient presents to the ED on 5/12 with a 1 week history of worsening right flank pain, worse with urination, that was sharp in nature, and radiated down to the right lower groin.  Endorsed urinary frequency and urgency, as well as chills.  Vital signs were WNL, CT renal study showed right perinephric stranding possibly suggestive of pyelonephritis, urinalysis showed any bacteria, small hemoglobin.  Patient treated with Dilaudid with improvement of her pain, she received 1 dose of Rocephin and discharged on ciprofloxacin.  Since then she reports that she tried taking the ciprofloxacin for 3 days, she first tried it with her meal, prior to her meal, and after her meal and she states that about 30 minutes after ingesting it she had episodes of emesis.  She has been able to tolerate fluid intake and has been able to eat a little bit however does report a decreased appetite.  She states she is continuing to have right flank pain and a pressure sensation in the lower abdominal area, she had taken the hydrocodone that she was given however ran out.  She also has been using a heating pad and over-the-counter Tylenol which she states helps a little bit.  She had some chills about 2 days ago, she denied any fevers, nausea, vomiting, diarrhea, or constipation.  She is taking care of her children and her mother right now, reports that she does have some pain when she does this. On exam she is stable appearing, she has right lower quadrant abdominal pain to palpation, positive right CVA tenderness.  Symptoms still are consistent with pyelonephritis.  Patient has been able to tolerate p.o. intake and her vitals are stable.  We discussed trying a different oral antibiotic and some anti-inflammatories for the pain and she is agreeable to this.  We discussed that if her symptoms worsen, she is not able to tolerate the antibiotic, or if she is not tolerating p.o. intake then to contact us or present to the ER, she may need to have  IV antibiotics if this happens. -Meloxicam 7.5 mg daily -Continue Tylenol -Keflex 500 mg 4 times daily x 7 days -Advised on return precautions

## 2020-09-17 ENCOUNTER — Telehealth: Payer: Self-pay

## 2020-09-17 DIAGNOSIS — N12 Tubulo-interstitial nephritis, not specified as acute or chronic: Secondary | ICD-10-CM

## 2020-09-17 MED ORDER — OXYCODONE-ACETAMINOPHEN 7.5-325 MG PO TABS: 1.0000 | ORAL_TABLET | ORAL | 0 refills | Status: AC | PRN

## 2020-09-17 NOTE — Telephone Encounter (Signed)
Returned call to patient. States she is having no relief from right flank pain with daily mobic, keflex, or tylenol. States she has been taking tylenol every 6 hours x 2-3 weeks for this pain and is concerned she will hurt her liver. She is requesting call back from Dr. Gwyneth Revels.

## 2020-09-17 NOTE — Telephone Encounter (Signed)
Contacted patient regarding persistent abdominal pain and right flank pain.  Patient was diagnosed with pyelonephritis on 5/12 and initially was prescribed ciprofloxacin which she was not able to tolerate, she was then seen on 5/19 and started on Keflex, Mobic, Tylenol as needed for the pain.  Today she states that she is continuing to have right flank pain, she states that dysuria is still there however has improved, occasionally will have chills.  She has been able to tolerate her antibiotics and has been able to eat and drink with no issues.  She does report a mild decreased appetite and occasional nausea that improves with ginger ale. Given the timing of when she was diagnosed there is concern that she may have an abscess that has developed or a secondary process.  At this point does not appear that she needs admission, however if symptoms continue to persist or she starts to be unable to tolerate p.o. intake she likely will need inpatient admission for IV antibiotics.  -Repeat CT abdomen pelvis -Short course of oxycodone-acetaminophen 7.5- 325 mg every 4 hours x5 days -Continue meloxicam as needed -Continue acetaminophen as needed -Advised on return precautions

## 2020-09-17 NOTE — Telephone Encounter (Signed)
Please call pt back about meds, please call pt back.  

## 2020-10-24 ENCOUNTER — Other Ambulatory Visit: Payer: Self-pay | Admitting: Internal Medicine

## 2021-04-01 ENCOUNTER — Encounter: Payer: Self-pay | Admitting: Nurse Practitioner

## 2021-04-01 ENCOUNTER — Other Ambulatory Visit: Payer: Self-pay

## 2021-04-01 ENCOUNTER — Ambulatory Visit (INDEPENDENT_AMBULATORY_CARE_PROVIDER_SITE_OTHER): Payer: Medicaid Other | Admitting: Nurse Practitioner

## 2021-04-01 VITALS — BP 118/71 | HR 78 | Temp 98.3°F | Resp 18 | Wt 131.0 lb

## 2021-04-01 DIAGNOSIS — Z111 Encounter for screening for respiratory tuberculosis: Secondary | ICD-10-CM | POA: Diagnosis not present

## 2021-04-01 NOTE — Progress Notes (Signed)
Pt presents to establish care and PPD placement  Tuberculin skin test applied to right ventral forearm. Explained to patient to return within 48-72 hours for reading and paperwork. Patient verbalized understanding.

## 2021-04-01 NOTE — Progress Notes (Signed)
New Patient Office Visit  Subjective:  Patient ID: Renee Suarez, female    DOB: 12/27/1990  Age: 30 y.o. MRN: [id number]  CC:  Chief Complaint  Patient presents with   Establish Care   PPD Placement    HPI AUBRIONNA ISTRE presents for to establish care and PPD placement.   Past Medical History:  Diagnosis Date   Anemia    Eclampsia    with 1st preg   GERD (gastroesophageal reflux disease)    post partum 1st preg.   Headache    Hypertension    Pregnancy induced hypertension    Preterm delivery    Preterm labor    S/P appendectomy 11/30/2016   Sickle cell trait [facility])     Past Surgical History:  Procedure Laterality Date   APPENDECTOMY     CESAREAN SECTION     CESAREAN SECTION  04/25/2011   Procedure: CESAREAN SECTION;  Surgeon: Tereso Newcomer, MD;  Location: [facility];  Service: Gynecology;  Laterality: N/A;  Repeat C/Section   CESAREAN SECTION WITH BILATERAL TUBAL LIGATION Bilateral 08/06/2016   Procedure: CESAREAN SECTION WITH BILATERAL TUBAL LIGATION;  Surgeon: Reva Bores, MD;  Location: [facility];  Service: Obstetrics;  Laterality: Bilateral;   CHOLECYSTECTOMY     CHOLECYSTECTOMY  2010   LAPAROSCOPIC APPENDECTOMY N/A 11/30/2016   Procedure: APPENDECTOMY LAPAROSCOPIC;  Surgeon: Kinsinger, De Blanch, MD;  Location: [facility];  Service: General;  Laterality: N/A;    Family History  Problem Relation Age of Onset   Hypertension Paternal Grandmother    Diabetes Paternal Grandmother    Heart disease Paternal Grandmother    Diabetes Mother    Cancer Paternal Grandfather        pancreatic   Mental illness Brother    Mental retardation Brother     Social History   Socioeconomic History   Marital status: Single    Spouse name: Not on file   Number of children: Not on file   Years of education: Not on file   Highest education level: Not on file  Occupational History   Not on file  Tobacco Use   Smoking status: Former   Smokeless tobacco: Never   Vaping Use   Vaping Use: Never used  Substance and Sexual Activity   Alcohol use: No   Drug use: Yes    Types: Marijuana   Sexual activity: Yes    Partners: Male    Birth control/protection: Surgical  Other Topics Concern   Not on file  Social History Narrative   Not on file   Social Determinants of Health   Financial Resource Strain: Not on file  Food Insecurity: Not on file  Transportation Needs: Not on file  Physical Activity: Not on file  Stress: Not on file  Social Connections: Not on file  Intimate Partner Violence: Not on file    ROS Review of Systems  Objective:   Today's Vitals: BP 118/71 (BP Location: Left Arm, Patient Position: Sitting, Cuff Size: Normal)   Pulse 78   Temp 98.3 F (36.8 C)   Resp 18   Wt 131 lb (59.4 kg)   SpO2 98%   BMI 25.58 kg/m   Physical Exam  Assessment & Plan:   Problem List Items Addressed This Visit   None Visit Diagnoses     PPD screening test    -  Primary   Relevant Orders   TB Skin Test       Outpatient Encounter Medications  as of 04/01/2021  Medication Sig   acetaminophen (TYLENOL) 325 MG tablet Take 650 mg by mouth every 6 (six) hours as needed for mild pain, fever or headache.   meloxicam (MOBIC) 7.5 MG tablet TAKE 1 TABLET BY MOUTH EVERY DAY   oxyCODONE-acetaminophen (PERCOCET) 7.5-325 MG tablet Take 1 tablet by mouth every 4 (four) hours as needed for severe pain.   No facility-administered encounter medications on file as of 04/01/2021.    Follow-up: No follow-ups on file.   Margorie John, CMA

## 2021-04-03 LAB — TB SKIN TEST
Induration: 0 mm
TB Skin Test: NEGATIVE

## 2021-05-06 ENCOUNTER — Encounter: Payer: Medicaid Other | Admitting: Internal Medicine

## 2021-05-06 ENCOUNTER — Telehealth: Payer: Self-pay

## 2021-05-06 NOTE — Progress Notes (Deleted)
° °  CC: anxiety  HPI:  Ms.Shabana N Haber is a 31 y.o. with a PMHx listed   Past Medical History:  Diagnosis Date   Anemia    Eclampsia    with 1st preg   GERD (gastroesophageal reflux disease)    post partum 1st preg.   Headache    Hypertension    Pregnancy induced hypertension    Preterm delivery    Preterm labor    S/P appendectomy 11/30/2016   Sickle cell trait (HCC)    Review of Systems:  ***  Physical Exam:  There were no vitals filed for this visit. ***  Assessment & Plan:   See Encounters Tab for problem based charting.  Patient {GC/GE:[id number]::"discussed with","seen with"} Dr. {NAMES:[id number]::"Guilloud","Hoffman","Mullen","Narendra","Williams","Vincent"}

## 2021-05-06 NOTE — Telephone Encounter (Signed)
Phone call to patient about missed appointment, NO Answer Renee Suarez C1/9/20239:37 AM

## 2021-05-07 ENCOUNTER — Other Ambulatory Visit: Payer: Self-pay

## 2021-05-07 ENCOUNTER — Encounter: Payer: Self-pay | Admitting: Internal Medicine

## 2021-05-07 ENCOUNTER — Ambulatory Visit: Payer: Medicaid Other | Admitting: Internal Medicine

## 2021-05-07 VITALS — BP 103/67 | HR 67 | Temp 98.4°F | Ht 66.0 in | Wt 111.0 lb

## 2021-05-07 DIAGNOSIS — F419 Anxiety disorder, unspecified: Secondary | ICD-10-CM

## 2021-05-07 DIAGNOSIS — F411 Generalized anxiety disorder: Secondary | ICD-10-CM | POA: Diagnosis not present

## 2021-05-07 DIAGNOSIS — F322 Major depressive disorder, single episode, severe without psychotic features: Secondary | ICD-10-CM

## 2021-05-07 DIAGNOSIS — F332 Major depressive disorder, recurrent severe without psychotic features: Secondary | ICD-10-CM

## 2021-05-07 MED ORDER — CITALOPRAM HYDROBROMIDE 10 MG PO TABS: 10.0000 mg | ORAL_TABLET | Freq: Every day | ORAL | 2 refills | Status: DC

## 2021-05-07 MED ORDER — BUSPIRONE HCL 10 MG PO TABS: 10.0000 mg | ORAL_TABLET | Freq: Three times a day (TID) | ORAL | 0 refills | Status: AC

## 2021-05-07 NOTE — Progress Notes (Signed)
° °  CC: anxiety  HPI:  [name] is a 31 y.o. with a PMHx listed below presenting for the evaluation of anxiety. For details of today's visit and the status of his chronic medical issues please refer to the assessment and plan.   Past Medical History:  Diagnosis Date   Anemia    Eclampsia    with 1st preg   GERD (gastroesophageal reflux disease)    post partum 1st preg.   Headache    Hypertension    Pregnancy induced hypertension    Preterm delivery    Preterm labor    S/P appendectomy 11/30/2016   Sickle cell trait (HCC)    Review of Systems:   Review of Systems  Constitutional:  Positive for malaise/fatigue. Negative for chills and fever.  Respiratory:  Positive for shortness of breath.   Cardiovascular:  Positive for palpitations. Negative for chest pain.  Neurological:  Negative for dizziness and headaches.  Psychiatric/Behavioral:  Positive for depression. Negative for suicidal ideas. The patient is nervous/anxious and has insomnia.     Physical Exam:  Vitals:   05/07/21 0918  BP: 103/67  Pulse: 67  Temp: 98.4 F (36.9 C)  TempSrc: Oral  SpO2: 100%  Weight: 111 lb (50.3 kg)  Height: 5\' 6"  (1.676 m)   Physical Exam General: alert, appears stated age, in no acute distress HEENT: Normocephalic, atraumatic, EOM intact, conjunctiva normal CV: Regular rate and rhythm, no murmurs rubs or gallops Pulm: Clear to auscultation bilaterally, normal work of breathing Abdomen: Soft, nondistended, bowel sounds present, no tenderness to palpation MSK: No lower extremity edema Skin: Warm and dry Neuro: Alert and oriented x3   Assessment & Plan:   See Encounters Tab for problem based charting.  Patient discussed with Dr.  

## 2021-05-07 NOTE — Patient Instructions (Signed)
Start Celexa 10 mg nightly.  Start Buspar 10 mg three times daily.  I have sent a referral to our in house psychologist.   Follow up in 4 weeks.

## 2021-05-08 DIAGNOSIS — F419 Anxiety disorder, unspecified: Secondary | ICD-10-CM | POA: Insufficient documentation

## 2021-05-08 DIAGNOSIS — F329 Major depressive disorder, single episode, unspecified: Secondary | ICD-10-CM | POA: Insufficient documentation

## 2021-05-08 NOTE — Assessment & Plan Note (Signed)
Patient is also experiencing a lot of generalized anxiety alongside her depression.  She describes frequent anxiety attacks during which she feels her heart is racing and she become short of breath.  She took a family members Xanax which helped her symptoms.  Discussed that treatment for depression will also help treat her anxiety.  Starting on Celexa and BuSpar and referred to Dr. Carolynne Edouard.

## 2021-05-08 NOTE — Assessment & Plan Note (Signed)
Patient presents today for evaluation of depression and anxiety that she has been experiencing for the past year.  She states that she has multiple life stressors including her mother being hospitalized for major abdominal surgery.  She subsequently lost her home and is living with a friend with her 3 children.  She also recently found out her fianc was cheating on her with her niece.  She became tearful on exam and expressed wanting to feel better to be able to be there for her children.  She denies any suicidal ideations or thoughts.  She has never felt any of this before or been treated for depression or anxiety.  She describes feelings of depression, hopelessness, weight loss, insomnia and just feeling poorly.  She is agreeable to starting medication and also starting therapy.  -Start Celexa 10 mg daily -Start BuSpar 10 mg 3 times daily -Referral to Dr. Sallyanne Kuster

## 2021-05-09 NOTE — Progress Notes (Signed)
Internal Medicine Clinic Attending ° °Case discussed with Dr. Rehman  At the time of the visit.  We reviewed the resident’s history and exam and pertinent patient test results.  I agree with the assessment, diagnosis, and plan of care documented in the resident’s note.  ° °

## 2021-05-10 ENCOUNTER — Telehealth: Payer: Self-pay | Admitting: *Deleted

## 2021-05-10 NOTE — Telephone Encounter (Signed)
Call from pt's mother,Renee Suarez, who stated pt was seen on Tuesday and Xanax was supposed to be ordered for the anxiety. Informed the mother Buspar and Celexa have been ordered and pt needs to give these meds time to work. Stated she understands but repeated Xanax seems to work per pt  told the mother I will inform Dr Laural Golden who saw the pt.

## 2021-05-15 ENCOUNTER — Telehealth: Payer: Self-pay

## 2021-05-15 NOTE — Telephone Encounter (Signed)
RTC to patient unable to leave message that Clinics had returned her call.

## 2021-05-15 NOTE — Telephone Encounter (Signed)
Requesting to speak with a nurse about medication. Please call pt back.  °

## 2021-05-16 ENCOUNTER — Encounter: Payer: Self-pay | Admitting: Internal Medicine

## 2021-06-05 ENCOUNTER — Telehealth: Payer: Self-pay | Admitting: Behavioral Health

## 2021-06-05 ENCOUNTER — Institutional Professional Consult (permissible substitution): Payer: Medicaid Other | Admitting: Behavioral Health

## 2021-06-05 NOTE — Telephone Encounter (Signed)
Lft Pt 3 msgs to [facility] to [facility] for r/s of IBH telehealth session @ her convenience.   Dr. Monna Fam

## 2021-06-14 ENCOUNTER — Ambulatory Visit: Payer: Medicaid Other | Admitting: Obstetrics

## 2021-06-19 ENCOUNTER — Ambulatory Visit (INDEPENDENT_AMBULATORY_CARE_PROVIDER_SITE_OTHER): Payer: Medicaid Other | Admitting: Obstetrics

## 2021-06-19 ENCOUNTER — Other Ambulatory Visit (HOSPITAL_COMMUNITY)
Admission: RE | Admit: 2021-06-19 | Discharge: 2021-06-19 | Disposition: A | Discharge status: Home / Self Care | Payer: Medicaid Other | Source: Ambulatory Visit | Attending: Obstetrics | Admitting: Obstetrics

## 2021-06-19 ENCOUNTER — Other Ambulatory Visit: Payer: Self-pay

## 2021-06-19 ENCOUNTER — Encounter: Payer: Self-pay | Admitting: Obstetrics

## 2021-06-19 VITALS — BP 113/71 | HR 74 | Wt 108.6 lb

## 2021-06-19 DIAGNOSIS — N898 Other specified noninflammatory disorders of vagina: Secondary | ICD-10-CM

## 2021-06-19 DIAGNOSIS — R300 Dysuria: Secondary | ICD-10-CM | POA: Diagnosis not present

## 2021-06-19 DIAGNOSIS — Z113 Encounter for screening for infections with a predominantly sexual mode of transmission: Secondary | ICD-10-CM | POA: Diagnosis not present

## 2021-06-19 DIAGNOSIS — Z01419 Encounter for gynecological examination (general) (routine) without abnormal findings: Secondary | ICD-10-CM | POA: Insufficient documentation

## 2021-06-19 LAB — POCT URINALYSIS DIPSTICK
Bilirubin, UA: NEGATIVE
Glucose, UA: NEGATIVE
Ketones, UA: NEGATIVE
Nitrite, UA: NEGATIVE
Protein, UA: POSITIVE — AB
Spec Grav, UA: 1.015 (ref 1.010–1.025)
Urobilinogen, UA: 0.2 E.U./dL
pH, UA: 6 (ref 5.0–8.0)

## 2021-06-19 MED ORDER — NITROFURANTOIN MONOHYD MACRO 100 MG PO CAPS: 100.0000 mg | ORAL_CAPSULE | Freq: Two times a day (BID) | ORAL | 2 refills | Status: DC

## 2021-06-19 MED ORDER — IBUPROFEN 800 MG PO TABS: 800.0000 mg | ORAL_TABLET | Freq: Three times a day (TID) | ORAL | 5 refills | Status: DC | PRN

## 2021-06-19 NOTE — Progress Notes (Signed)
Patient ID: Renee Suarez, female   DOB: [birth year], 31 y.o.   MRN: [id number]  Chief Complaint  Patient presents with   Urinary Tract Infection    HPI Renee Suarez is a 31 y.o. female.  Complains of burning with urination and vaginal discharge. HPI  Past Medical History:  Diagnosis Date   Anemia    Eclampsia    with 1st preg   GERD (gastroesophageal reflux disease)    post partum 1st preg.   Headache    Hypertension    Pregnancy induced hypertension    Preterm delivery    Preterm labor    S/P appendectomy 11/30/2016   Sickle cell trait [facility])     Past Surgical History:  Procedure Laterality Date   APPENDECTOMY     CESAREAN SECTION     CESAREAN SECTION  04/25/2011   Procedure: CESAREAN SECTION;  Surgeon: Osborne Oman, MD;  Location: [facility];  Service: Gynecology;  Laterality: N/A;  Repeat C/Section   CESAREAN SECTION WITH BILATERAL TUBAL LIGATION Bilateral 08/06/2016   Procedure: CESAREAN SECTION WITH BILATERAL TUBAL LIGATION;  Surgeon: Donnamae Jude, MD;  Location: [facility];  Service: Obstetrics;  Laterality: Bilateral;   CHOLECYSTECTOMY     CHOLECYSTECTOMY  2010   LAPAROSCOPIC APPENDECTOMY N/A 11/30/2016   Procedure: APPENDECTOMY LAPAROSCOPIC;  Surgeon: Kinsinger, Arta Bruce, MD;  Location: [facility];  Service: General;  Laterality: N/A;    Family History  Problem Relation Age of Onset   Hypertension Paternal Grandmother    Diabetes Paternal Grandmother    Heart disease Paternal Grandmother    Diabetes Mother    Cancer Paternal Grandfather        pancreatic   Mental illness Brother    Mental retardation Brother     Social History Social History   Tobacco Use   Smoking status: Former   Smokeless tobacco: Never  Scientific laboratory technician Use: Never used  Substance Use Topics   Alcohol use: No   Drug use: Yes    Types: Marijuana    Allergies  Allergen Reactions   Hydrocodone-Acetaminophen Hives   Iodine Solution [Povidone Iodine] Hives     Developed pin-point rash with itching where Betadine was used for C/S.    Current Outpatient Medications  Medication Sig Dispense Refill   acetaminophen (TYLENOL) 325 MG tablet Take 650 mg by mouth every 6 (six) hours as needed for mild pain, fever or headache.     citalopram (CELEXA) 10 MG tablet Take 1 tablet (10 mg total) by mouth daily. (Patient not taking: Reported on 06/19/2021) 30 tablet 2   meloxicam (MOBIC) 7.5 MG tablet TAKE 1 TABLET BY MOUTH EVERY DAY (Patient not taking: Reported on 06/19/2021) 30 tablet 0   oxyCODONE-acetaminophen (PERCOCET) 7.5-325 MG tablet Take 1 tablet by mouth every 4 (four) hours as needed for severe pain. (Patient not taking: Reported on 06/19/2021) 20 tablet 0   No current facility-administered medications for this visit.    Review of Systems Review of Systems Constitutional: negative for fatigue and weight loss Respiratory: negative for cough and wheezing Cardiovascular: negative for chest pain, fatigue and palpitations Gastrointestinal: negative for abdominal pain and change in bowel habits Genitourinary:  positive for burning with urination and vaginal discharge Integument/breast: negative for nipple discharge Musculoskeletal:negative for myalgias Neurological: negative for gait problems and tremors Behavioral/Psych: negative for abusive relationship, depression Endocrine: negative for temperature intolerance      Blood pressure 113/71, pulse 74, weight 108 lb 9.6 oz (  49.3 kg), currently breastfeeding.  Physical Exam Physical Exam General:   Alert and no distress  Skin:   no rash or abnormalities  Lungs:   clear to auscultation bilaterally  Heart:   regular rate and rhythm, S1, S2 normal, no murmur, click, rub or gallop  Breasts:   normal without suspicious masses, skin or nipple changes or axillary nodes  Abdomen:  normal findings: no organomegaly, soft, non-tender and no hernia  Pelvis:  External genitalia: normal general appearance Urinary  system: urethral meatus normal and bladder without fullness, nontender Vaginal: normal without tenderness, induration or masses Cervix: normal appearance Adnexa: normal bimanual exam Uterus: anteverted and non-tender, normal size    I have spent a total of 20 minutes of face-to-face time, excluding clinical staff time, reviewing notes and preparing to see patient, ordering tests and/or medications, and counseling the patient.    Data Reviewed Urinalysis Urine culture  Assessment      1. Encounter for routine gynecological examination with Papanicolaou smear of cervix Rx: - Cytology - PAP( [facility])  2. Dysuria Rx: - POCT urinalysis dipstick - Urine Culture - nitrofurantoin, macrocrystal-monohydrate, (MACROBID) 100 MG capsule; Take 1 capsule (100 mg total) by mouth 2 (two) times daily. 1 po BID x 7days  Dispense: 14 capsule; Refill: 2  3. Vaginal discharge Rx: - Cervicovaginal ancillary only  4. Screening for STD (sexually transmitted disease) Rx: - Hepatitis B surface antigen - HIV Antibody (routine testing w rflx) - RPR - Hepatitis C antibody   Plan   Follow up in 1 year    Shelly Bombard, MD 06/19/2021 9:43 AM

## 2021-06-20 LAB — CERVICOVAGINAL ANCILLARY ONLY
Bacterial Vaginitis (gardnerella): NEGATIVE
Candida Glabrata: NEGATIVE
Candida Vaginitis: NEGATIVE
Chlamydia: NEGATIVE
Comment: NEGATIVE
Comment: NEGATIVE
Comment: NEGATIVE
Comment: NEGATIVE
Comment: NEGATIVE
Comment: NORMAL
Neisseria Gonorrhea: NEGATIVE
Trichomonas: NEGATIVE

## 2021-06-20 LAB — RPR: RPR Ser Ql: NONREACTIVE

## 2021-06-20 LAB — HEPATITIS C ANTIBODY: Hep C Virus Ab: NONREACTIVE

## 2021-06-20 LAB — HEPATITIS B SURFACE ANTIGEN: Hepatitis B Surface Ag: NEGATIVE

## 2021-06-20 LAB — HIV ANTIBODY (ROUTINE TESTING W REFLEX): HIV Screen 4th Generation wRfx: NONREACTIVE

## 2021-06-24 ENCOUNTER — Other Ambulatory Visit: Payer: Self-pay | Admitting: Obstetrics

## 2021-06-24 LAB — CYTOLOGY - PAP
Comment: NEGATIVE
Diagnosis: NEGATIVE
High risk HPV: NEGATIVE

## 2021-06-24 LAB — URINE CULTURE

## 2021-07-01 ENCOUNTER — Ambulatory Visit: Payer: Medicaid Other | Admitting: Behavioral Health

## 2021-07-01 ENCOUNTER — Other Ambulatory Visit: Payer: Self-pay

## 2021-07-01 ENCOUNTER — Telehealth: Payer: Self-pay | Admitting: Behavioral Health

## 2021-07-01 NOTE — Telephone Encounter (Signed)
Unable to lv Pt a msg due to notification, 'Subscriber not in service'. First attempt on 06/05/21 also unsuccessful. Pt can call to r/s @ her convenience. ? ?Dr. Theodis Shove ?

## 2021-07-29 ENCOUNTER — Ambulatory Visit: Payer: Medicaid Other | Admitting: Behavioral Health

## 2021-07-29 DIAGNOSIS — F331 Major depressive disorder, recurrent, moderate: Secondary | ICD-10-CM

## 2021-07-29 DIAGNOSIS — F419 Anxiety disorder, unspecified: Secondary | ICD-10-CM

## 2021-07-29 NOTE — BH Specialist Note (Addendum)
Integrated Behavioral Health via Telemedicine Visit ? ?07/29/2021 ?Renee Suarez ?[id number] ? ?Number of [facility] Clinician visits: 1 ?Session Start time: 0910 ?Session End time: 0930 ?Total time in minutes: 20 min ? ?Referring Provider: Dr. Valentino Hue, DO ?Patient/Family location: Pt is home in private ?[facility] Provider location: [facility] Office ?All persons participating in visit: Pt & Clinician ?Types of Service: Individual psychotherapy ? ?I connected with Renee Suarez and/or [place]  self  via  Telephone or Video Enabled Telemedicine Application  (Video is Caregility application) and verified that I am speaking with the correct person using two identifiers. Discussed confidentiality: Yes  ? ?I discussed the limitations of telemedicine and the availability of in person appointments.  Discussed there is a possibility of technology failure and discussed alternative modes of communication if that failure occurs. ? ?I discussed that engaging in this telemedicine visit, they consent to the provision of behavioral healthcare and the services will be billed under their insurance. ? ?Patient and/or legal guardian expressed understanding and consented to Telemedicine visit: Yes  ? 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 Pt is upset today while discussing the initial details. ?Duration of problem: since Jan 2022; Severity of problem: moderate ? ?Patient and/or Family's Strengths/Protective Factors: ?Concrete supports in place (healthy food, safe environments, etc.), Sense of purpose, and Parental Resilience ? ?Goals Addressed: ?Patient will: ? Reduce symptoms of: anxiety, depression,  and stress; Pt describes her stress as symptomatic of depression ? Increase knowledge and/or ability of: coping skills, healthy habits, self-management skills, and stress reduction  ? Demonstrate ability to: Increase healthy adjustment to current life circumstances ? ?Progress towards Goals: ?Estb'd today; Pt placed Clinician on hold & in the process disconnected the phone line. Clinician had previously let Pt know call ends @ 9:30am. Pt will be scheduled by Clinician for next avail appt. ? ?Interventions: ?Interventions utilized:  Optician, dispensing and Supportive Counseling ?Standardized Assessments completed:  screeners prn ? ?Patient and/or Family Response: Pt receptive to call today, stating she needs someone to talk to bc her circumstances are making her dep'd. Pt requests future appt, but disconnected the line prior to scheduling discussion.  ? ?Assessment: ?Patient currently experiencing high anxiety, frustration, & worsening Sx of dep due to circumstances in 2022 & her adjustment to them now.  ? ?Pt able to secure housing for her & 3 children in early Dec 2022. ? ?Patient may benefit from cont'd Cslg to process events in 2022. ? ?Plan: ?Follow up with behavioral health clinician on : 2-3 wks for 60 min telehealth ?Behavioral recommendations: None given due to disconnected call ?Referral(s): [facility] (In Clinic) ? ?I discussed the assessment and treatment plan with the patient and/or parent/guardian. They were provided an opportunity to ask questions and all were answered. They agreed with the plan and demonstrated an understanding of the instructions. ?  ?They were advised to call back or seek an in-person evaluation if the symptoms worsen or if the condition fails to improve as anticipated. ? ?Donnetta Hutching, LMFT ?

## 2021-08-28 ENCOUNTER — Telehealth: Payer: Self-pay | Admitting: Behavioral Health

## 2021-08-28 ENCOUNTER — Institutional Professional Consult (permissible substitution): Payer: Medicaid Other | Admitting: Behavioral Health

## 2021-08-28 NOTE — Telephone Encounter (Signed)
Reached Pt today for IBH telehealth session. Pt sts she is w/a Ct at the present time. Pt requested Clinician RC in 30 min. Clinician declined stating Pt can r/s w/[facility] @ her convenience so visit is suitable to her schedule. Pt agreed.  ? ?Dr. Monna Fam ?

## 2021-09-27 ENCOUNTER — Emergency Department (HOSPITAL_COMMUNITY)
Admission: EM | Admit: 2021-09-27 | Discharge: 2021-09-27 | Disposition: A | Discharge status: Home / Self Care | Payer: Medicaid Other | Attending: Emergency Medicine | Admitting: Emergency Medicine

## 2021-09-27 ENCOUNTER — Encounter (HOSPITAL_COMMUNITY): Payer: Self-pay | Admitting: Emergency Medicine

## 2021-09-27 ENCOUNTER — Emergency Department (HOSPITAL_COMMUNITY): Payer: Medicaid Other

## 2021-09-27 DIAGNOSIS — J029 Acute pharyngitis, unspecified: Secondary | ICD-10-CM | POA: Diagnosis present

## 2021-09-27 DIAGNOSIS — J36 Peritonsillar abscess: Secondary | ICD-10-CM | POA: Insufficient documentation

## 2021-09-27 DIAGNOSIS — J039 Acute tonsillitis, unspecified: Secondary | ICD-10-CM

## 2021-09-27 LAB — I-STAT BETA HCG BLOOD, ED (MC, WL, AP ONLY): I-stat hCG, quantitative: <5 m[IU]/mL (ref <5)

## 2021-09-27 LAB — BASIC METABOLIC PANEL
Anion gap: 7 (ref 5–15)
BUN: 9 mg/dL (ref 6–20)
CO2: 25 mmol/L (ref 22–32)
Calcium: 9.3 mg/dL (ref 8.9–10.3)
Chloride: 108 mmol/L (ref 98–111)
Creatinine, Ser: 0.56 mg/dL (ref 0.44–1.00)
GFR, Estimated: >60 mL/min (ref >60)
Glucose, Bld: 87 mg/dL (ref 70–99)
Potassium: 3.8 mmol/L (ref 3.5–5.1)
Sodium: 140 mmol/L (ref 135–145)

## 2021-09-27 LAB — CBC
HCT: 37.2 % (ref 36.0–46.0)
Hemoglobin: 12.5 g/dL (ref 12.0–15.0)
MCH: 26.9 pg (ref 26.0–34.0)
MCHC: 33.6 g/dL (ref 30.0–36.0)
MCV: 80.2 fL (ref 80.0–100.0)
Platelets: 185 10*3/uL (ref 150–400)
RBC: 4.64 MIL/uL (ref 3.87–5.11)
RDW: 13.2 % (ref 11.5–15.5)
WBC: 8.5 10*3/uL (ref 4.0–10.5)
nRBC: 0 % (ref 0.0–0.2)

## 2021-09-27 MED ORDER — MORPHINE SULFATE (PF) 2 MG/ML IV SOLN
2.0000 mg | Freq: Once | INTRAVENOUS | Status: AC
  Administered 2021-09-27: 2 mg via INTRAVENOUS
  Filled 2021-09-27: qty 1

## 2021-09-27 MED ORDER — CLINDAMYCIN HCL 300 MG PO CAPS: 300.0000 mg | ORAL_CAPSULE | Freq: Three times a day (TID) | ORAL | 0 refills | Status: DC

## 2021-09-27 MED ORDER — IOHEXOL 300 MG/ML  SOLN
75.0000 mL | Freq: Once | INTRAMUSCULAR | Status: AC | PRN
  Administered 2021-09-27: 75 mL via INTRAVENOUS

## 2021-09-27 MED ORDER — SODIUM CHLORIDE 0.9 % IV BOLUS
1000.0000 mL | Freq: Once | INTRAVENOUS | Status: AC
  Administered 2021-09-27: 1000 mL via INTRAVENOUS

## 2021-09-27 MED ORDER — DEXAMETHASONE SODIUM PHOSPHATE 10 MG/ML IJ SOLN
10.0000 mg | Freq: Once | INTRAMUSCULAR | Status: AC
Start: 2021-09-27 — End: 2021-09-27
  Administered 2021-09-27: 10 mg via INTRAVENOUS
  Filled 2021-09-27: qty 1

## 2021-09-27 MED ORDER — OXYCODONE HCL 5 MG PO TABS: 5.0000 mg | ORAL_TABLET | Freq: Four times a day (QID) | ORAL | 0 refills | Status: DC | PRN

## 2021-09-27 MED ORDER — SODIUM CHLORIDE 0.9 % IV SOLN
1.0000 g | Freq: Once | INTRAVENOUS | Status: AC
  Administered 2021-09-27: 1 g via INTRAVENOUS
  Filled 2021-09-27: qty 10

## 2021-09-27 NOTE — Discharge Instructions (Addendum)
Please use Tylenol or ibuprofen for pain.  You may use 600 mg ibuprofen every 6 hours or 1000 mg of Tylenol every 6 hours.  You may choose to alternate between the 2.  This would be most effective.  Not to exceed 4 g of Tylenol within 24 hours.  Not to exceed 3200 mg ibuprofen 24 hours.  You can use the narcotic pain medication up to every 6 hours for severe breakthrough pain. Please take all of the antibiotics I have prescribed. Please follow up with the ENT surgeon whose contact information I have attached as soon as you are able to.

## 2021-09-27 NOTE — ED Provider Notes (Signed)
[facility]-EMERGENCY DEPT Provider Note   CSN: [id number] Arrival date & time: 09/27/21  1517     History  Chief Complaint  Patient presents with   Sore Throat    Renee Suarez is a 31 y.o. female with no significant past medical history who presents with concern for severe sore throat, difficulty swallowing, difficulty tolerating her own secretions for the last 2 days.  Patient reports that she started with a sore throat yesterday, woke up this morning with severe pain, took Tylenol 325 around 9 or 10 AM, and woke up with much worse throat pain, difficulty tolerating her own secretions.  She denies any previous history of severe tonsillitis, peritonsillar abscess, IV drug use.  She reports that she also has some pain in her ears, denies chest pain, shortness of breath, nausea, vomiting, abdominal pain.   Sore Throat      Home Medications Prior to Admission medications   Medication Sig Start Date End Date Taking? Authorizing Provider  clindamycin (CLEOCIN) 300 MG capsule Take 1 capsule (300 mg total) by mouth 3 (three) times daily. 09/27/21  Yes Coby Shrewsberry H, PA-C  oxyCODONE (ROXICODONE) 5 MG immediate release tablet Take 1 tablet (5 mg total) by mouth every 6 (six) hours as needed for severe pain. 09/27/21  Yes Anibal Quinby H, PA-C  acetaminophen (TYLENOL) 325 MG tablet Take 650 mg by mouth every 6 (six) hours as needed for mild pain, fever or headache.    [provider]  ibuprofen (ADVIL) 800 MG tablet Take 1 tablet (800 mg total) by mouth every 8 (eight) hours as needed. 06/19/21   Brock Bad, MD  nitrofurantoin, macrocrystal-monohydrate, (MACROBID) 100 MG capsule Take 1 capsule (100 mg total) by mouth 2 (two) times daily. 1 po BID x 7days 06/19/21   Brock Bad, MD      Allergies    Hydrocodone-acetaminophen and Iodine solution [povidone iodine]    Review of Systems   Review of Systems  HENT:  Positive for sore throat  and trouble swallowing.   All other systems reviewed and are negative.  Physical Exam Updated Vital Signs BP 105/86   Pulse (!) 55   Temp 98.9 F (37.2 C)   Resp 18   LMP 09/20/2021 (Approximate)   SpO2 100%  Physical Exam Vitals and nursing note reviewed.  Constitutional:      General: She is not in acute distress.    Appearance: Normal appearance. She is ill-appearing.  HENT:     Head: Normocephalic and atraumatic.     Mouth/Throat:     Comments: Patient has some asymmetric swelling of tonsils, left greater than right, minimal deviation of uvula.  Respiratory passages seem clear from the top, however patient has significant tenderness palpation of the throat, no trismus but patient is not tolerating her own secretions at this moment Eyes:     General:        Right eye: No discharge.        Left eye: No discharge.  Cardiovascular:     Rate and Rhythm: Normal rate and regular rhythm.     Heart sounds: No murmur heard.   No friction rub. No gallop.  Pulmonary:     Effort: Pulmonary effort is normal.     Breath sounds: Normal breath sounds.  Abdominal:     General: Bowel sounds are normal.     Palpations: Abdomen is soft.  Skin:    General: Skin is warm and dry.  Capillary Refill: Capillary refill takes less than 2 seconds.  Neurological:     Mental Status: She is alert and oriented to person, place, and time.  Psychiatric:        Mood and Affect: Mood normal.        Behavior: Behavior normal.    ED Results / Procedures / Treatments   Labs (all labs ordered are listed, but only abnormal results are displayed) Labs Reviewed  CBC  BASIC METABOLIC PANEL  I-STAT BETA HCG BLOOD, ED ([facility], [facility], [facility] ONLY)    EKG None  Radiology CT Soft Tissue Neck W Contrast  Result Date: 09/27/2021 CLINICAL DATA:  Epiglottitis or tonsillitis suspected EXAM: CT NECK WITH CONTRAST TECHNIQUE: Multidetector CT imaging of the neck was performed using the standard protocol following the  bolus administration of intravenous contrast. RADIATION DOSE REDUCTION: This exam was performed according to the departmental dose-optimization program which includes automated exposure control, adjustment of the mA and/or kV according to patient size and/or use of iterative reconstruction technique. CONTRAST:  75mL OMNIPAQUE IOHEXOL 300 MG/ML  SOLN COMPARISON:  None Available. FINDINGS: Pharynx and larynx: Mild enlargement of the palatine tonsils bilaterally with somewhat striated appearance, compatible with tonsillitis. Approximately 9 x 8 x 12 mm fluid collection with peripheral enhancement adjacent left tonsil, compatible with peritonsillar abscess. Mild surrounding edema. Salivary glands: No inflammation, mass, or stone. Thyroid: Normal. Lymph nodes: Mildly prominent upper cervical chain nodes, nonspecific but likely reactive given the above findings. Vascular: Limited assessment due to non arterial timing. Major arteries are grossly patent. Limited intracranial: Negative. Visualized orbits: Negative. Mastoids and visualized paranasal sinuses: Clear. Skeleton: No acute or aggressive process. Upper chest: Negative. IMPRESSION: Findings compatible with tonsillitis and 12 mm left peritonsillar abscess, described above. Electronically Signed   By: Feliberto HartsFrederick S Jones M.D.   On: 09/27/2021 17:24    Procedures Procedures    Medications Ordered in ED Medications  morphine (PF) 2 MG/ML injection 2 mg (2 mg Intravenous Given 09/27/21 1601)  dexamethasone (DECADRON) injection 10 mg (10 mg Intravenous Given 09/27/21 1600)  iohexol (OMNIPAQUE) 300 MG/ML solution 75 mL (75 mLs Intravenous Contrast Given 09/27/21 1645)  morphine (PF) 2 MG/ML injection 2 mg (2 mg Intravenous Given 09/27/21 1800)  cefTRIAXone (ROCEPHIN) 1 g in sodium chloride 0.9 % 100 mL IVPB (0 g Intravenous Stopped 09/27/21 1935)  sodium chloride 0.9 % bolus 1,000 mL (0 mLs Intravenous Stopped 09/27/21 1935)    ED Course/ Medical Decision Making/  A&P Clinical Course as of 09/27/21 1957  Fri Sep 27, 2021  1746 Spoke with Dr. Jenne PaneBates who endorsed IV Rocephin versus clindamycin in the emergency department, discharged with clindamycin prescription, close follow-up next week in the office.  We will administer Decadron, IV fluids to help with difficulty tolerating own secretions.  Patient remains without evidence of trismus, and stable vital signs at this time.  She has no leukocytosis. [CP]    Clinical Course User Index [CP] Olene FlossProsperi, Destin Vinsant H, PA-C                           Medical Decision Making Amount and/or Complexity of Data Reviewed Labs: ordered. Radiology: ordered.  Risk Prescription drug management.   This patient is a 31 y.o. female who presents to the ED for concern of sore throat for 2 days, severely worsened today, this involves an extensive number of treatment options, and is a complaint that carries with it a high risk of complications  and morbidity. The emergent differential diagnosis prior to evaluation includes, but is not limited to, tonsillitis, epiglottitis, peritonsillar abscess, Ludwig angina versus other ENT emergency.  Less clinical concern for Boerhaave's, otherwise with no history of traumatic injury of throat or vomiting.   This is not an exhaustive differential.   Past Medical History / Co-morbidities / Social History: Anxiety, depression, pyelonephritis, sickle cell trait  Additional history: Chart reviewed. Pertinent results include: Reviewed outpatient family medicine visits including lab work, imaging  Physical Exam: Physical exam performed. The pertinent findings include: With left greater than right uvular swelling, she has no trismus, she does not tolerate her own fluids initially, but later in the visit she is able to tolerate her fluids without difficulty.   Patient with no evidence of respiratory compromise, stridor or difficulty breathing throughout her examination.  Lab Tests: I ordered,  and personally interpreted labs.  The pertinent results include: Patient with unremarkable BMP, CBC, nonpregnant.   Imaging Studies: I ordered imaging studies including CT soft tissue neck with contrast. I independently visualized and interpreted imaging which showed small 12 millimeter peritonsillar abscess on the left, there are findings consistent with tonsillitis. I agree with the radiologist interpretation.   Medications: I ordered medication including Rocephin for peritonsillar abscess, Decadron for tonsillar swelling, fluids for tonsillar swelling, morphine for pain. Reevaluation of the patient after these medicines showed that the patient improved. I have reviewed the patients home medicines and have made adjustments as needed.  Patient will need reevaluation for status of tonsillitis after administration of antibiotics, however her pain and tonsillar swelling is improved at time of her evaluation, she has continued to have no stridor, no difficulty breathing, and she is not tolerating her own secretions.  Consultations Obtained: I requested consultation with the ENT doctor, spoke with Dr. Jenne Pane,  and discussed lab and imaging findings as well as pertinent plan - they recommend: Follow-up in office next week, clindamycin, dose of antibiotics in the emergency department, fluids, Decadron.   Disposition: After consideration of the diagnostic results and the patients response to treatment, I feel that patient's clinical condition is improved, she continues to have stable vital signs, she has no evidence of evolving sepsis, she has no leukocytosis, she is now tolerating her own secretions, and she has a plan for follow-up with ENT.  She appears stable for discharge at this time, extensive return precautions given.  I discussed this case with my attending physician Dr. Freida Busman who cosigned this note including patient's presenting symptoms, physical exam, and planned diagnostics and interventions.  Attending physician stated agreement with plan or made changes to plan which were implemented.    Final Clinical Impression(s) / ED Diagnoses Final diagnoses:  Peritonsillar abscess  Tonsillitis    Rx / DC Orders ED Discharge Orders          Ordered    clindamycin (CLEOCIN) 300 MG capsule  3 times daily        09/27/21 1926    oxyCODONE (ROXICODONE) 5 MG immediate release tablet  Every 6 hours PRN        09/27/21 1926              [place] 09/27/21 1957    Lorre Nick, MD 09/28/21 1605

## 2021-09-27 NOTE — ED Triage Notes (Addendum)
Patient c/o sore throat L>R x2 days. Unable to maintain saliva. Also reports headache. Denies fevers.

## 2021-09-27 NOTE — ED Notes (Signed)
Patient transported to CT 

## 2022-05-06 ENCOUNTER — Encounter: Payer: Self-pay | Admitting: Family

## 2022-05-06 ENCOUNTER — Ambulatory Visit (INDEPENDENT_AMBULATORY_CARE_PROVIDER_SITE_OTHER): Payer: Medicaid Other | Admitting: Family

## 2022-05-06 VITALS — BP 102/69 | HR 76 | Temp 98.3°F | Resp 16 | Ht 61.02 in | Wt 113.0 lb

## 2022-05-06 DIAGNOSIS — Z7689 Persons encountering health services in other specified circumstances: Secondary | ICD-10-CM | POA: Diagnosis not present

## 2022-05-06 DIAGNOSIS — R634 Abnormal weight loss: Secondary | ICD-10-CM

## 2022-05-06 DIAGNOSIS — Z111 Encounter for screening for respiratory tuberculosis: Secondary | ICD-10-CM

## 2022-05-06 NOTE — Progress Notes (Signed)
Patient ID: Renee Suarez, female    DOB: 1990/07/02  MRN: [id number]  CC: Establish Care  Subjective: Renee Suarez is a 32 y.o. female who presents for establish care.   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  - Would like to gain weight. Reports in the past during pregnancy she was 212 pounds. Reports when was not pregnant she is usually 160 pounds. Reports 2 years ago she almost lost her mother and did notice a decreased appetite and lost a lot of weight because of it. Reports she does smoke marijuana and has "the munchies" but still cannot gain weight. Reports the women on both sides of her family "have weight on them".   Patient Active Problem List   Diagnosis Date Noted   Anxiety 05/08/2021   Major depression 05/08/2021   Pyelonephritis 09/12/2020   Unintentional weight loss 02/23/2020   S/P laparoscopic cholecystectomy 02/23/2020   S/P appendectomy 11/30/2016   Postpartum care following cesarean delivery 08/06/2016   Sickle cell trait (HCC) 01/30/2016   History of cesarean delivery, antepartum 01/22/2016     Current Outpatient Medications on File Prior to Visit  Medication Sig Dispense Refill   acetaminophen (TYLENOL) 325 MG tablet Take 650 mg by mouth every 6 (six) hours as needed for mild pain, fever or headache.     ibuprofen (ADVIL) 800 MG tablet Take 1 tablet (800 mg total) by mouth every 8 (eight) hours as needed. 30 tablet 5   No current facility-administered medications on file prior to visit.    Allergies  Allergen Reactions   Hydrocodone-Acetaminophen Hives   Iodine Solution [Povidone Iodine] Hives    Developed pin-point rash with itching where Betadine was used for C/S.    Social History   Socioeconomic History   Marital status: Single    Spouse name: Not on file   Number of children: Not on file   Years of education: Not on file   Highest education level: Not on file  Occupational  History   Not on file  Tobacco Use   Smoking status: Former    Passive exposure: Past   Smokeless tobacco: Never  Vaping Use   Vaping Use: Never used  Substance and Sexual Activity   Alcohol use: No   Drug use: Yes    Types: Marijuana   Sexual activity: Not Currently    Partners: Male    Birth control/protection: Surgical  Other Topics Concern   Not on file  Social History Narrative   Not on file   Social Determinants of Health   Financial Resource Strain: Not on file  Food Insecurity: Not on file  Transportation Needs: Not on file  Physical Activity: Not on file  Stress: Not on file  Social Connections: Not on file  Intimate Partner Violence: Not on file    Family History  Problem Relation Age of Onset   Hypertension Paternal Grandmother    Diabetes Paternal Grandmother    Heart disease Paternal Grandmother    Diabetes Mother    Cancer Paternal Grandfather        pancreatic   Mental illness Brother    Mental retardation Brother     Past Surgical History:  Procedure Laterality Date   APPENDECTOMY     CESAREAN SECTION     CESAREAN SECTION  04/25/2011   Procedure: CESAREAN SECTION;  Surgeon: Tereso Newcomer, MD;  Location: [facility];  Service:  Gynecology;  Laterality: N/A;  Repeat C/Section   CESAREAN SECTION WITH BILATERAL TUBAL LIGATION Bilateral 08/06/2016   Procedure: CESAREAN SECTION WITH BILATERAL TUBAL LIGATION;  Surgeon: Donnamae Jude, MD;  Location: [place];  Service: Obstetrics;  Laterality: Bilateral;   CHOLECYSTECTOMY     CHOLECYSTECTOMY  2010   LAPAROSCOPIC APPENDECTOMY N/A 11/30/2016   Procedure: APPENDECTOMY LAPAROSCOPIC;  Surgeon: Kinsinger, Arta Bruce, MD;  Location: [facility];  Service: General;  Laterality: N/A;    ROS: Review of Systems Negative except as stated above  PHYSICAL EXAM: BP 102/69 (BP Location: Left Arm, Patient Position: Sitting, Cuff Size: Normal)   Pulse 76   Temp 98.3 F (36.8 C)   Resp 16   Ht 5' 1.02" (1.55 m)    Wt 113 lb (51.3 kg)   SpO2 98%   Breastfeeding No   BMI 21.33 kg/m   Physical Exam HENT:     Head: Normocephalic and atraumatic.  Eyes:     Extraocular Movements: Extraocular movements intact.     Conjunctiva/sclera: Conjunctivae normal.     Pupils: Pupils are equal, round, and reactive to light.  Cardiovascular:     Rate and Rhythm: Normal rate and regular rhythm.     Pulses: Normal pulses.     Heart sounds: Normal heart sounds.  Pulmonary:     Effort: Pulmonary effort is normal.     Breath sounds: Normal breath sounds.  Musculoskeletal:     Cervical back: Normal range of motion and neck supple.  Neurological:     General: No focal deficit present.     Mental Status: She is alert and oriented to person, place, and time.  Psychiatric:        Mood and Affect: Mood normal.        Behavior: Behavior normal.    ASSESSMENT AND PLAN: 1. Encounter to establish care - Patient presents today to establish care.  - Return for annual physical examination, labs, and health maintenance. Arrive fasting meaning having no food for at least 8 hours prior to appointment. You may have only water or black coffee. Please take scheduled medications as normal.  2. PPD screening test - TB skin test today in office. Return in 48 to 72 hours for CMA visit.  - QuantiFERON-TB Gold Plus  3. Loss of weight - Referral to Medical Weight Management for further evaluation/management.  - Amb Ref to Medical Weight Management   Patient was given the opportunity to ask questions.  Patient verbalized understanding of the plan and was able to repeat key elements of the plan. Patient was given clear instructions to go to Emergency Department or return to medical center if symptoms don't improve, worsen, or new problems develop.The patient verbalized understanding.   Orders Placed This Encounter  Procedures   QuantiFERON-TB Gold Plus   Amb Ref to Medical Weight Management   Follow-up with primary provider  as scheduled.   Camillia Herter, NP

## 2022-05-06 NOTE — Progress Notes (Signed)
.  Pt presents to establish care,  -pt wants to discuss options on how to gain weight  -need TB test for employment

## 2022-05-10 LAB — QUANTIFERON-TB GOLD PLUS
QuantiFERON Mitogen Value: >10 [IU]/mL
QuantiFERON Nil Value: 0 [IU]/mL
QuantiFERON TB1 Ag Value: 0.05 [IU]/mL
QuantiFERON TB2 Ag Value: 0.04 [IU]/mL
QuantiFERON-TB Gold Plus: NEGATIVE

## 2022-08-25 ENCOUNTER — Emergency Department (HOSPITAL_COMMUNITY)
Admission: EM | Admit: 2022-08-25 | Discharge: 2022-08-25 | Disposition: A | Discharge status: Home / Self Care | Payer: Medicaid Other | Attending: Emergency Medicine | Admitting: Emergency Medicine

## 2022-08-25 ENCOUNTER — Encounter (HOSPITAL_COMMUNITY): Payer: Self-pay

## 2022-08-25 DIAGNOSIS — R209 Unspecified disturbances of skin sensation: Secondary | ICD-10-CM | POA: Insufficient documentation

## 2022-08-25 DIAGNOSIS — R55 Syncope and collapse: Secondary | ICD-10-CM | POA: Diagnosis present

## 2022-08-25 LAB — BASIC METABOLIC PANEL
Anion gap: 8 (ref 5–15)
BUN: 11 mg/dL (ref 6–20)
CO2: 23 mmol/L (ref 22–32)
Calcium: 8.9 mg/dL (ref 8.9–10.3)
Chloride: 105 mmol/L (ref 98–111)
Creatinine, Ser: 0.55 mg/dL (ref 0.44–1.00)
GFR, Estimated: >60 mL/min (ref >60)
Glucose, Bld: 85 mg/dL (ref 70–99)
Potassium: 3.8 mmol/L (ref 3.5–5.1)
Sodium: 136 mmol/L (ref 135–145)

## 2022-08-25 LAB — URINALYSIS, ROUTINE W REFLEX MICROSCOPIC
Bilirubin Urine: NEGATIVE
Glucose, UA: NEGATIVE mg/dL
Hgb urine dipstick: NEGATIVE
Ketones, ur: NEGATIVE mg/dL
Leukocytes,Ua: NEGATIVE
Nitrite: NEGATIVE
Protein, ur: NEGATIVE mg/dL
Specific Gravity, Urine: 1.012 (ref 1.005–1.030)
pH: 7 (ref 5.0–8.0)

## 2022-08-25 LAB — CBC WITH DIFFERENTIAL/PLATELET
Abs Immature Granulocytes: 0.01 10*3/uL (ref 0.00–0.07)
Basophils Absolute: 0 10*3/uL (ref 0.0–0.1)
Basophils Relative: 0 %
Eosinophils Absolute: 0.2 10*3/uL (ref 0.0–0.5)
Eosinophils Relative: 3 %
HCT: 39.7 % (ref 36.0–46.0)
Hemoglobin: 12.5 g/dL (ref 12.0–15.0)
Immature Granulocytes: 0 %
Lymphocytes Relative: 47 %
Lymphs Abs: 2.7 10*3/uL (ref 0.7–4.0)
MCH: 27.7 pg (ref 26.0–34.0)
MCHC: 31.5 g/dL (ref 30.0–36.0)
MCV: 87.8 fL (ref 80.0–100.0)
Monocytes Absolute: 0.2 10*3/uL (ref 0.1–1.0)
Monocytes Relative: 4 %
Neutro Abs: 2.6 10*3/uL (ref 1.7–7.7)
Neutrophils Relative %: 46 %
Platelets: 148 10*3/uL — ABNORMAL LOW (ref 150–400)
RBC: 4.52 MIL/uL (ref 3.87–5.11)
RDW: 13.4 % (ref 11.5–15.5)
WBC: 5.7 10*3/uL (ref 4.0–10.5)
nRBC: 0 % (ref 0.0–0.2)

## 2022-08-25 LAB — CBG MONITORING, ED: Glucose-Capillary: 107 mg/dL — ABNORMAL HIGH (ref 70–99)

## 2022-08-25 LAB — PREGNANCY, URINE: Preg Test, Ur: NEGATIVE

## 2022-08-25 MED ORDER — ACETAMINOPHEN 325 MG PO TABS
650.0000 mg | ORAL_TABLET | Freq: Once | ORAL | Status: AC
Start: 2022-08-25 — End: 2022-08-25
  Administered 2022-08-25: 650 mg via ORAL
  Filled 2022-08-25: qty 2

## 2022-08-25 NOTE — ED Provider Notes (Signed)
[place] EMERGENCY DEPARTMENT AT [facility] Provider Note   CSN: [id number] Arrival date & time: 08/25/22  1622     History  Chief Complaint  Patient presents with   Loss of Consciousness    Renee Suarez is a 32 y.o. female.   Loss of Consciousness Associated symptoms: no chest pain, no fever, no headaches, no palpitations, no seizures, no shortness of breath and no weakness      Patient denies any history of any significant medical problems.  Patient states she was doing a family members here today.  She went to get that person a hug when her vision started to blackout.  Patient states she then had an episode of loss of consciousness.  Patient states she was helped to the couch.  She remembers being on the couch.  She denies feeling overheated.  She denies any nausea or vomiting.  She is not having any headache.  Patient did have a sensation that her arm felt numb but is not having any focal numbness or weakness.  She is not having trouble with her speech or vision.  She denies any chest pain or shortness of breath.  No abdominal pain.  Patient states she has been urinating a little bit more frequently and she cannot call with any different.  Right now she feels fine and denies any symptoms.  She denies any prior history of syncope  Home Medications Prior to Admission medications   Medication Sig Start Date End Date Taking? Authorizing Provider  acetaminophen (TYLENOL) 325 MG tablet Take 650 mg by mouth every 6 (six) hours as needed for mild pain, fever or headache.    [provider]  ibuprofen (ADVIL) 800 MG tablet Take 1 tablet (800 mg total) by mouth every 8 (eight) hours as needed. 06/19/21   Brock Bad, MD      Allergies    Hydrocodone-acetaminophen and Iodine solution [povidone iodine]    Review of Systems   Review of Systems  Constitutional:  Negative for fever.  Respiratory:  Negative for chest tightness and shortness of breath.    Cardiovascular:  Positive for syncope. Negative for chest pain and palpitations.  Skin:  Negative for rash.  Neurological:  Negative for seizures, weakness and headaches.    Physical Exam Updated Vital Signs BP 97/66   Pulse 61   Temp 98.4 F (36.9 C) (Oral)   Resp 14   LMP 08/18/2022   SpO2 100%  Physical Exam Vitals and nursing note reviewed.  Constitutional:      General: She is not in acute distress.    Appearance: She is well-developed.  HENT:     Head: Normocephalic and atraumatic.     Right Ear: External ear normal.     Left Ear: External ear normal.  Eyes:     General: No visual field deficit or scleral icterus.       Right eye: No discharge.        Left eye: No discharge.     Conjunctiva/sclera: Conjunctivae normal.  Neck:     Trachea: No tracheal deviation.  Cardiovascular:     Rate and Rhythm: Normal rate and regular rhythm.  Pulmonary:     Effort: Pulmonary effort is normal. No respiratory distress.     Breath sounds: Normal breath sounds. No stridor. No wheezing or rales.  Abdominal:     General: Bowel sounds are normal. There is no distension.     Palpations: Abdomen is soft.  Tenderness: There is no abdominal tenderness. There is no guarding or rebound.  Musculoskeletal:        General: No tenderness or deformity.     Cervical back: Neck supple.  Skin:    General: Skin is warm and dry.     Findings: No rash.  Neurological:     General: No focal deficit present.     Mental Status: She is alert and oriented to person, place, and time.     Cranial Nerves: No cranial nerve deficit, dysarthria or facial asymmetry.     Sensory: No sensory deficit.     Motor: No abnormal muscle tone, seizure activity or pronator drift.     Coordination: Coordination normal.     Comments:  able to hold both legs off bed for 5 seconds, sensation intact in all extremities,  no left or right sided neglect, normal finger-nose exam bilaterally, no nystagmus noted    Psychiatric:        Mood and Affect: Mood normal.     ED Results / Procedures / Treatments   Labs (all labs ordered are listed, but only abnormal results are displayed) Labs Reviewed  CBC WITH DIFFERENTIAL/PLATELET - Abnormal; Notable for the following components:      Result Value   Platelets 148 (*)    All other components within normal limits  CBG MONITORING, ED - Abnormal; Notable for the following components:   Glucose-Capillary 107 (*)    All other components within normal limits  URINALYSIS, ROUTINE W REFLEX MICROSCOPIC  PREGNANCY, URINE  BASIC METABOLIC PANEL    EKG EKG Interpretation  Date/Time:  Monday August 25 2022 16:35:55 EDT Ventricular Rate:  71 PR Interval:  190 QRS Duration: 74 QT Interval:  400 QTC Calculation: 435 R Axis:   58 Text Interpretation: Sinus rhythm Low voltage, precordial leads Borderline T wave abnormalities No significant change since last tracing Confirmed by Linwood Dibbles [phone]) on 08/25/2022 5:03:03 PM  Radiology No results found.  Procedures Procedures    Medications Ordered in ED Medications  acetaminophen (TYLENOL) tablet 650 mg (650 mg Oral Given 08/25/22 2025)    ED Course/ Medical Decision Making/ A&P Clinical Course as of 08/25/22 2102  Mon Aug 25, 2022  1928 CBC normal [JK]  2019 Urinalysis without signs of infection.  Pregnancy test negative.  Metabolic panel normal. [JK]    Clinical Course User Index [JK] Linwood Dibbles, MD                             Medical Decision Making Problems Addressed: Syncope, unspecified syncope type: acute illness or injury that poses a threat to life or bodily functions  Amount and/or Complexity of Data Reviewed Labs: ordered. Decision-making details documented in ED Course.  Risk OTC drugs.   Patient presented to the ED for evaluation after a syncopal episode.  Patient did not have any clear precipitating factors.  She is asymptomatic here in the emergency room other than having  some urinary discomfort.  Patient is not having any trouble with any chest pain.  She has not had any abdominal pain.  ED workup is reassuring.  She is not anemic.  She is not dehydrated.  She is not pregnant.  No signs of acute electrolyte abnormality.  Patient was monitored in the ED.  No recurrent syncopal episodes.  At this time she appears stable for discharge.  Recommend outpatient follow-up with PCP.  Return to the ED if  she has any recurrent episodes.        Final Clinical Impression(s) / ED Diagnoses Final diagnoses:  Syncope, unspecified syncope type    Rx / DC Orders ED Discharge Orders     None         Linwood Dibbles, MD 08/25/22 2102

## 2022-08-25 NOTE — ED Notes (Signed)
Discharge papers reviewed with pt, pt ambulatory from ED 

## 2022-08-25 NOTE — Discharge Instructions (Signed)
The test today were reassuring.  Follow-up with your primary care doctor to be rechecked.  Return to the emergency room for recurrent episodes.

## 2022-08-25 NOTE — ED Triage Notes (Signed)
Pt arrives today c/o a sycnopal episode today. Pt was doing her family's hair, was given a hug, then passed out. Pt states family caught her. Pt also states she has bilateral arm numbness.

## 2023-01-19 ENCOUNTER — Other Ambulatory Visit: Payer: Self-pay

## 2023-01-19 ENCOUNTER — Emergency Department (HOSPITAL_COMMUNITY): Payer: MEDICAID

## 2023-01-19 ENCOUNTER — Encounter (HOSPITAL_COMMUNITY): Payer: Self-pay

## 2023-01-19 ENCOUNTER — Emergency Department (HOSPITAL_COMMUNITY)
Admission: EM | Admit: 2023-01-19 | Discharge: 2023-01-19 | Disposition: A | Discharge status: Home / Self Care | Payer: MEDICAID | Attending: Emergency Medicine | Admitting: Emergency Medicine

## 2023-01-19 DIAGNOSIS — M546 Pain in thoracic spine: Secondary | ICD-10-CM | POA: Diagnosis present

## 2023-01-19 LAB — CBC
HCT: 40.2 % (ref 36.0–46.0)
Hemoglobin: 13.2 g/dL (ref 12.0–15.0)
MCH: 26.7 pg (ref 26.0–34.0)
MCHC: 32.8 g/dL (ref 30.0–36.0)
MCV: 81.2 fL (ref 80.0–100.0)
Platelets: 188 10*3/uL (ref 150–400)
RBC: 4.95 MIL/uL (ref 3.87–5.11)
RDW: 13 % (ref 11.5–15.5)
WBC: 6.2 10*3/uL (ref 4.0–10.5)
nRBC: 0 % (ref 0.0–0.2)

## 2023-01-19 LAB — COMPREHENSIVE METABOLIC PANEL
ALT: 10 U/L (ref 0–44)
AST: 15 U/L (ref 15–41)
Albumin: 4.3 g/dL (ref 3.5–5.0)
Alkaline Phosphatase: 32 U/L — ABNORMAL LOW (ref 38–126)
Anion gap: 10 (ref 5–15)
BUN: 13 mg/dL (ref 6–20)
CO2: 24 mmol/L (ref 22–32)
Calcium: 9.6 mg/dL (ref 8.9–10.3)
Chloride: 105 mmol/L (ref 98–111)
Creatinine, Ser: 0.62 mg/dL (ref 0.44–1.00)
GFR, Estimated: >60 mL/min (ref >60)
Glucose, Bld: 84 mg/dL (ref 70–99)
Potassium: 3.2 mmol/L — ABNORMAL LOW (ref 3.5–5.1)
Sodium: 139 mmol/L (ref 135–145)
Total Bilirubin: 1.5 mg/dL — ABNORMAL HIGH (ref 0.3–1.2)
Total Protein: 7.6 g/dL (ref 6.5–8.1)

## 2023-01-19 LAB — URINALYSIS, ROUTINE W REFLEX MICROSCOPIC
Bilirubin Urine: NEGATIVE
Glucose, UA: NEGATIVE mg/dL
Hgb urine dipstick: NEGATIVE
Ketones, ur: NEGATIVE mg/dL
Leukocytes,Ua: NEGATIVE
Nitrite: NEGATIVE
Protein, ur: NEGATIVE mg/dL
Specific Gravity, Urine: 1.017 (ref 1.005–1.030)
pH: 5 (ref 5.0–8.0)

## 2023-01-19 LAB — TROPONIN I (HIGH SENSITIVITY)
Troponin I (High Sensitivity): <2 ng/L (ref <18)
Troponin I (High Sensitivity): <2 ng/L (ref <18)

## 2023-01-19 LAB — PREGNANCY, URINE: Preg Test, Ur: NEGATIVE

## 2023-01-19 LAB — D-DIMER, QUANTITATIVE: D-Dimer, Quant: <0.27 ug/mL-FEU (ref 0.00–0.50)

## 2023-01-19 MED ORDER — OXYCODONE-ACETAMINOPHEN 5-325 MG PO TABS
1.0000 | ORAL_TABLET | Freq: Once | ORAL | Status: AC
  Administered 2023-01-19: 1 via ORAL
  Filled 2023-01-19: qty 1

## 2023-01-19 MED ORDER — KETOROLAC TROMETHAMINE 10 MG PO TABS: 10.0000 mg | ORAL_TABLET | Freq: Four times a day (QID) | ORAL | 0 refills | Status: DC | PRN

## 2023-01-19 MED ORDER — LIDOCAINE 4 % EX PTCH: 1.0000 | MEDICATED_PATCH | CUTANEOUS | 0 refills | Status: DC

## 2023-01-19 MED ORDER — LIDOCAINE 5 % EX PTCH
1.0000 | MEDICATED_PATCH | CUTANEOUS | Status: DC
  Administered 2023-01-19: 1 via TRANSDERMAL
  Filled 2023-01-19: qty 1

## 2023-01-19 MED ORDER — CYCLOBENZAPRINE HCL 10 MG PO TABS
5.0000 mg | ORAL_TABLET | Freq: Once | ORAL | Status: AC
  Administered 2023-01-19: 5 mg via ORAL
  Filled 2023-01-19: qty 1

## 2023-01-19 MED ORDER — IBUPROFEN 800 MG PO TABS
800.0000 mg | ORAL_TABLET | Freq: Once | ORAL | Status: AC
  Administered 2023-01-19: 800 mg via ORAL
  Filled 2023-01-19: qty 1

## 2023-01-19 MED ORDER — CYCLOBENZAPRINE HCL 5 MG PO TABS: 5.0000 mg | ORAL_TABLET | Freq: Three times a day (TID) | ORAL | 0 refills | Status: DC | PRN

## 2023-01-19 NOTE — ED Provider Triage Note (Signed)
Emergency Medicine Provider Triage Evaluation Note  Renee Suarez , a 32 y.o. female  was evaluated in triage.  Pt complains of urinary urgency, decreased output, right flank/back pain.  Review of Systems  Positive:  Negative: Hematuria, vaginal dc  Physical Exam  BP 125/85   Pulse 90   Temp 98.9 F (37.2 C) (Oral)   Resp 16   Ht 4\' 11"  (1.499 m)   Wt 53.1 kg   SpO2 100%   BMI 23.63 kg/m  Gen:   Awake, no distress   Resp:  Normal effort  MSK:   Moves extremities without difficulty  Other:  No CVA tenderness   Medical Decision Making  Medically screening exam initiated at 3:22 PM.  Appropriate orders placed.  Renee Suarez was informed that the remainder of the evaluation will be completed by another provider, this initial triage assessment does not replace that evaluation, and the importance of remaining in the ED until their evaluation is complete.     Renee Fend, PA-C 01/19/23 [phone]

## 2023-01-19 NOTE — ED Provider Notes (Signed)
[place] EMERGENCY DEPARTMENT AT [facility] Provider Note   CSN: [id number] Arrival date & time: 01/19/23  1451     History  Chief Complaint  Patient presents with   Flank Pain   Urinary Frequency    Renee Suarez is a 32 y.o. female, pertinent past medical history, who presents to the ED secondary to right upper/mid back pain, has been going on for the last few days.  She states that sharp and stabbing, and worse when taking deep breath.  Denies any heavy lifting, trauma, falls, rash to the area.  States that it came out of nowhere.  Denies any chest pain, nausea, vomiting, diarrhea, or abdominal pain.  No use of OCPs.  Has not used any kind of medicines to help with the pain.  Also reports increased urinary frequency.  No recent illness.  Pain is worse with turning     Home Medications Prior to Admission medications   Medication Sig Start Date End Date Taking? Authorizing Provider  acetaminophen (TYLENOL) 325 MG tablet Take 650 mg by mouth every 6 (six) hours as needed for mild pain, fever or headache.    [provider]  ibuprofen (ADVIL) 800 MG tablet Take 1 tablet (800 mg total) by mouth every 8 (eight) hours as needed. 06/19/21   Brock Bad, MD      Allergies    Iodine solution [povidone iodine]    Review of Systems   Review of Systems  Respiratory:  Negative for cough and shortness of breath.   Cardiovascular:  Negative for chest pain, palpitations and leg swelling.  Genitourinary:  Positive for frequency.    Physical Exam Updated Vital Signs BP 113/68   Pulse 63   Temp 98.3 F (36.8 C) (Oral)   Resp 16   Ht 4\' 11"  (1.499 m)   Wt 52 kg   SpO2 100%   BMI 23.17 kg/m  Physical Exam Vitals and nursing note reviewed.  Constitutional:      General: She is not in acute distress.    Appearance: She is well-developed.  HENT:     Head: Normocephalic and atraumatic.  Eyes:     Conjunctiva/sclera: Conjunctivae normal.   Cardiovascular:     Rate and Rhythm: Normal rate and regular rhythm.     Heart sounds: No murmur heard. Pulmonary:     Effort: Pulmonary effort is normal. No respiratory distress.     Breath sounds: Normal breath sounds.  Abdominal:     Palpations: Abdomen is soft.     Tenderness: There is no abdominal tenderness.  Musculoskeletal:        General: No swelling.       Arms:     Cervical back: Neck supple.     Comments: Tenderness to palpation of right mid back.  No wound, no crepitus, no erythema, or rash.  Pain with truncal rotation  Skin:    General: Skin is warm and dry.     Capillary Refill: Capillary refill takes less than 2 seconds.  Neurological:     Mental Status: She is alert.  Psychiatric:        Mood and Affect: Mood normal.     ED Results / Procedures / Treatments   Labs (all labs ordered are listed, but only abnormal results are displayed) Labs Reviewed  URINALYSIS, ROUTINE W REFLEX MICROSCOPIC - Abnormal; Notable for the following components:      Result Value   APPearance HAZY (*)    All  other components within normal limits  COMPREHENSIVE METABOLIC PANEL - Abnormal; Notable for the following components:   Potassium 3.2 (*)    Alkaline Phosphatase 32 (*)    Total Bilirubin 1.5 (*)    All other components within normal limits  CBC  PREGNANCY, URINE  D-DIMER, QUANTITATIVE  TROPONIN I (HIGH SENSITIVITY)    EKG None  Radiology No results found.  Procedures Procedures    Medications Ordered in ED Medications  cyclobenzaprine (FLEXERIL) tablet 5 mg (has no administration in time range)  ibuprofen (ADVIL) tablet 800 mg (has no administration in time range)  lidocaine (LIDODERM) 5 % 1 patch (has no administration in time range)    ED Course/ Medical Decision Making/ A&P                                 Medical Decision Making Patient is a 32 year old female, here for right upper back pain, has been going on for last few days.  Worse when she  takes a deep breath, lung sounds are clear to auscultation we will obtain a D-dimer given the pleuritic chest pain, as well as chest x-ray.  She has not had any viral symptoms lately, or any frequent cough.  She has no step-offs, rash, or wounds to this area.  Overall well-appearing.  Will give Flexeril, ibuprofen, and lidocaine for pain control  Amount and/or Complexity of Data Reviewed Labs: ordered.    Details: D-dimer less than 0.27, blood work reassuring.  Troponin negative Radiology: ordered.    Details: Chest x-ray clear ECG/medicine tests:  Decision-making details documented in ED Course. Discussion of management or test interpretation with external provider(s): Discussed with patient, blood work is reassuring, D-dimer less than 0.27, unlikely to have blood clot, additionally has not had any symptoms to provoke a blood clot.  Troponins are flat, chest x-ray is reassuring.  No evidence of pneumonia or free air.  She has no pain with swallowing.  Is more with breathing.  May be secondary to muscle strain, or rib contusion.  No known trauma however.  Will have her follow-up with her primary care doctor, and provide her with Flexeril, Toradol, and lidocaine patches for home.  Return precautions emphasized has no abdominal pain, or lower back pain.  Risk OTC drugs. Prescription drug management.   Final Clinical Impression(s) / ED Diagnoses Final diagnoses:  None    Rx / DC Orders ED Discharge Orders     None         Keanu Frickey, Harley [place], PA 01/19/23 2329    Gloris Manchester, MD 01/20/23 2342

## 2023-01-19 NOTE — Discharge Instructions (Addendum)
Your blood work today was reassuring, there is no evidence of any kind of pneumonia on your x-ray, as well as your very low risk for blood clot.  Please follow-up with your primary care doctor, if you do not have a primary care doctor, call the number below, to make an appointment.  I prescribed you some pain medications.  Do not use any ibuprofen, naproxen, or other anti-inflammatories with the pain medicine.  You can take Tylenol with the the medicine to help with better pain control.  Return to the ER if you feel like your symptoms are worsening, and you have chest pain, shortness of breath, or worsening pain

## 2023-01-19 NOTE — ED Triage Notes (Signed)
Right sided flank pain that started a few days ago and is progressively worsening. C/o urinary frequency and that only "dribbles" are coming out.

## 2023-04-19 ENCOUNTER — Other Ambulatory Visit: Payer: Self-pay

## 2023-04-19 ENCOUNTER — Encounter (HOSPITAL_COMMUNITY): Payer: Self-pay

## 2023-04-19 ENCOUNTER — Emergency Department (HOSPITAL_COMMUNITY)
Admission: EM | Admit: 2023-04-19 | Discharge: 2023-04-19 | Disposition: A | Discharge status: Home / Self Care | Payer: MEDICAID | Attending: Emergency Medicine | Admitting: Emergency Medicine

## 2023-04-19 DIAGNOSIS — N3000 Acute cystitis without hematuria: Secondary | ICD-10-CM | POA: Diagnosis not present

## 2023-04-19 DIAGNOSIS — I10 Essential (primary) hypertension: Secondary | ICD-10-CM | POA: Insufficient documentation

## 2023-04-19 DIAGNOSIS — Z1152 Encounter for screening for COVID-19: Secondary | ICD-10-CM | POA: Insufficient documentation

## 2023-04-19 DIAGNOSIS — J101 Influenza due to other identified influenza virus with other respiratory manifestations: Secondary | ICD-10-CM | POA: Diagnosis not present

## 2023-04-19 DIAGNOSIS — R0981 Nasal congestion: Secondary | ICD-10-CM | POA: Diagnosis present

## 2023-04-19 LAB — RESP PANEL BY RT-PCR (RSV, FLU A&B, COVID)  RVPGX2
Influenza A by PCR: POSITIVE — AB
Influenza B by PCR: NEGATIVE
Resp Syncytial Virus by PCR: NEGATIVE
SARS Coronavirus 2 by RT PCR: NEGATIVE

## 2023-04-19 LAB — URINALYSIS, ROUTINE W REFLEX MICROSCOPIC
Bilirubin Urine: NEGATIVE
Glucose, UA: NEGATIVE mg/dL
Hgb urine dipstick: NEGATIVE
Ketones, ur: NEGATIVE mg/dL
Leukocytes,Ua: NEGATIVE
Nitrite: POSITIVE — AB
Protein, ur: NEGATIVE mg/dL
Specific Gravity, Urine: 1.011 (ref 1.005–1.030)
pH: 8 (ref 5.0–8.0)

## 2023-04-19 MED ORDER — NITROFURANTOIN MONOHYD MACRO 100 MG PO CAPS: 100.0000 mg | ORAL_CAPSULE | Freq: Two times a day (BID) | ORAL | 0 refills | Status: AC

## 2023-04-19 NOTE — ED Triage Notes (Signed)
Pt arrives via POV. Pt reports pressure in her head, congestion, and cough x 2 days. Pt reports dysuria since yesterday. Pt AxOx4.

## 2023-04-19 NOTE — Discharge Instructions (Addendum)
You have been seen today for your complaint of cough, congestion, runny nose, urinary frequency and urgency. Your lab work was positive for influenza A and a UTI. Your discharge medications include Alternate tylenol and ibuprofen for pain. You may alternate these every 4 hours. You may take up to 800 mg of ibuprofen at a time and up to 1000 mg of tylenol. Claritin and Flonase.  Use these medications daily for your influenza symptoms.  These are both over-the-counter medications. Home care instructions are as follows:  Drink plenty of water.  Eat a normal diet Follow up with: Your primary care provider in 1 week Please seek immediate medical care if you develop any of the following symptoms: You become short of breath or have trouble breathing. Your skin or nails turn blue. You have very bad pain or stiffness in your neck. You get a sudden headache or pain in your face or ear. You vomit each time you eat or drink. At this time there does not appear to be the presence of an emergent medical condition, however there is always the potential for conditions to change. Please read and follow the below instructions.  Do not take your medicine if  develop an itchy rash, swelling in your mouth or lips, or difficulty breathing; call 911 and seek immediate emergency medical attention if this occurs.  You may review your lab tests and imaging results in their entirety on your MyChart account.  Please discuss all results of fully with your primary care provider and other specialist at your follow-up visit.  Note: Portions of this text may have been transcribed using voice recognition software. Every effort was made to ensure accuracy; however, inadvertent computerized transcription errors may still be present.

## 2023-04-19 NOTE — ED Notes (Signed)
Lab contacted and requested urine culture

## 2023-04-19 NOTE — ED Provider Notes (Signed)
[place] EMERGENCY DEPARTMENT AT [facility] Provider Note   CSN: [id number] Arrival date & time: 04/19/23  0746     History  Chief Complaint  Patient presents with   Headache   Cough   Nasal Congestion   Dysuria    Renee Suarez is a 32 y.o. female.  With history of anemia, hypertension, GERD presenting to the ED for evaluation of multiple complaints.  She reports 2 days ago she developed pressure behind her eyes, congestion, rhinorrhea, sore throat, cough.  She had a subjective fever yesterday.  She denies any known sick contacts.  She denies any fevers today.  She reports minimal to no shortness of breath.  Cough is nonproductive.  She has been taking Motrin and sinus medication with some improvement.  She is also complaining of right-sided low back pain, urinary frequency and urgency.  This began yesterday.  She denies any dysuria.  No nausea or vomiting.  No abdominal pain.   Headache Associated symptoms: congestion and cough   Cough Associated symptoms: headaches and rhinorrhea   Dysuria      Home Medications Prior to Admission medications   Medication Sig Start Date End Date Taking? Authorizing Provider  nitrofurantoin, macrocrystal-monohydrate, (MACROBID) 100 MG capsule Take 1 capsule (100 mg total) by mouth 2 (two) times daily. 04/19/23  Yes Marcques Wrightsman, Edsel Petrin, PA-C  acetaminophen (TYLENOL) 325 MG tablet Take 650 mg by mouth every 6 (six) hours as needed for mild pain, fever or headache.    [provider]  cyclobenzaprine (FLEXERIL) 5 MG tablet Take 1 tablet (5 mg total) by mouth 3 (three) times daily as needed. 01/19/23   Small, Brooke L, PA  ketorolac (TORADOL) 10 MG tablet Take 1 tablet (10 mg total) by mouth every 6 (six) hours as needed. 01/19/23   Small, Brooke L, PA  lidocaine (HM LIDOCAINE PATCH) 4 % Place 1 patch onto the skin daily. 01/19/23   Small, Brooke L, PA      Allergies    Iodine solution [povidone iodine]    Review of  Systems   Review of Systems  HENT:  Positive for congestion and rhinorrhea.   Respiratory:  Positive for cough.   Genitourinary:  Positive for dysuria.  Neurological:  Positive for headaches.  All other systems reviewed and are negative.   Physical Exam Updated Vital Signs BP 116/73   Pulse 96   Temp 98.3 F (36.8 C)   Resp 17   Ht 5' (1.524 m)   Wt 52.2 kg   SpO2 99%   BMI 22.46 kg/m  Physical Exam Vitals and nursing note reviewed.  Constitutional:      General: She is not in acute distress.    Appearance: She is well-developed. She is not ill-appearing, toxic-appearing or diaphoretic.     Comments: Resting comfortably in bed  HENT:     Head: Normocephalic and atraumatic.     Right Ear: Tympanic membrane and ear canal normal.     Left Ear: Tympanic membrane and ear canal normal.     Nose: Congestion and rhinorrhea present.     Mouth/Throat:     Mouth: Mucous membranes are moist.     Pharynx: Oropharynx is clear.     Comments: Tonsils 0 bilaterally.  Uvula midline.  Posterior pharyngeal cobblestoning.  No exudate. Eyes:     Conjunctiva/sclera: Conjunctivae normal.  Cardiovascular:     Rate and Rhythm: Normal rate and regular rhythm.     Heart sounds: No  murmur heard. Pulmonary:     Effort: Pulmonary effort is normal. No respiratory distress.     Breath sounds: Normal breath sounds. No wheezing, rhonchi or rales.  Abdominal:     Palpations: Abdomen is soft.     Tenderness: There is no abdominal tenderness.     Comments: No CVA tenderness bilaterally  Musculoskeletal:        General: No swelling.     Cervical back: Neck supple.  Skin:    General: Skin is warm and dry.     Capillary Refill: Capillary refill takes less than 2 seconds.  Neurological:     Mental Status: She is alert and oriented to person, place, and time.  Psychiatric:        Mood and Affect: Mood normal.     ED Results / Procedures / Treatments   Labs (all labs ordered are listed, but only  abnormal results are displayed) Labs Reviewed  RESP PANEL BY RT-PCR (RSV, FLU A&B, COVID)  RVPGX2 - Abnormal; Notable for the following components:      Result Value   Influenza A by PCR POSITIVE (*)    All other components within normal limits  URINALYSIS, ROUTINE W REFLEX MICROSCOPIC - Abnormal; Notable for the following components:   APPearance HAZY (*)    Nitrite POSITIVE (*)    Bacteria, UA RARE (*)    All other components within normal limits  URINE CULTURE    EKG None  Radiology No results found.  Procedures Procedures    Medications Ordered in ED Medications - No data to display  ED Course/ Medical Decision Making/ A&P                                 Medical Decision Making Amount and/or Complexity of Data Reviewed Labs: ordered.  This patient presents to the ED for concern of upper respiratory symptoms, dysuria, this involves an extensive number of treatment options, and is a complaint that carries with it a high risk of complications and morbidity.  The differential diagnosis includes flu, COVID, RSV, mononucleosis, other viral URI.  Differential diagnosis for urinary symptoms includes cystitis, STI  My initial workup includes respiratory panel, urinalysis  Additional history obtained from: Nursing notes from this visit.  I ordered, reviewed and interpreted labs which include: Respiratory panel, urinalysis.  Influenza A positive.  Urine with positive nitrites, no leukocytes or white blood cells, rare bacteria  Afebrile, hemodynamically stable.  32 year old female presenting for evaluation of constellation of symptoms consistent with viral upper respiratory infection.  She is influenza A positive.  She is also complaining of urinary frequency and urgency.  Urine is nitrite positive with rare bacteria.  Will send for culture.  Given her symptoms, will treat with Macrobid.  She appears very well on physical exam.  No tachypnea, tachycardia, hypoxia.  Symptoms began  greater than 48 hours ago.  She was given information regarding supportive care.  She was encouraged to follow-up with her primary care provider in 1 week for reevaluation.  She was given return precautions.  Stable discharge.  At this time there does not appear to be any evidence of an acute emergency medical condition and the patient appears stable for discharge with appropriate outpatient follow up. Diagnosis was discussed with patient who verbalizes understanding of care plan and is agreeable to discharge. I have discussed return precautions with patient who verbalizes understanding. Patient encouraged to follow-up with  their PCP within 1 week. All questions answered.  Note: Portions of this report may have been transcribed using voice recognition software. Every effort was made to ensure accuracy; however, inadvertent computerized transcription errors may still be present.         Final Clinical Impression(s) / ED Diagnoses Final diagnoses:  Influenza A  Acute cystitis without hematuria    Rx / DC Orders ED Discharge Orders          Ordered    nitrofurantoin, macrocrystal-monohydrate, (MACROBID) 100 MG capsule  2 times daily        04/19/23 0929              Michelle Piper, PA-C 04/19/23 2841    Pricilla Loveless, MD 04/20/23 [phone]

## 2023-04-21 LAB — URINE CULTURE: Culture: >=100000 — AB

## 2023-04-22 ENCOUNTER — Telehealth (HOSPITAL_BASED_OUTPATIENT_CLINIC_OR_DEPARTMENT_OTHER): Payer: Self-pay

## 2023-04-22 NOTE — Telephone Encounter (Signed)
Post ED Visit - Positive Culture Follow-up  Culture report reviewed by antimicrobial stewardship pharmacist: [facility] []  Enzo Bi, Pharm.D. []  Celedonio Miyamoto, Pharm.D., BCPS AQ-ID []  Garvin Fila, Pharm.D., BCPS []  Georgina Pillion, Pharm.D., BCPS []  [place], [address].D., BCPS, AAHIVP []  Estella Husk, Pharm.D., BCPS, AAHIVP []  Lysle Pearl, PharmD, BCPS []  Phillips Climes, PharmD, BCPS []  Agapito Games, PharmD, BCPS []  Verlan Friends, PharmD []  Mervyn Gay, PharmD, BCPS []  Vinnie Level, PharmD  [facility] []  Len Childs, PharmD []  Greer Pickerel, PharmD []  Adalberto Cole, PharmD []  Perlie Gold, Rph []  Lonell Face) Jean Rosenthal, PharmD []  Earl Many, PharmD []  Junita Push, PharmD []  Dorna Leitz, PharmD []  Terrilee Files, PharmD []  Lynann Beaver, PharmD []  Keturah Barre, PharmD []  Loralee Pacas, PharmD []  Bernadene Person, PharmD   Positive urine culture Treated with Nitrofurantoin, organism sensitive to the same and no further patient follow-up is required at this time.  Sandria Senter 04/22/2023, 9:17 AM

## 2023-05-12 ENCOUNTER — Encounter (HOSPITAL_COMMUNITY): Payer: Self-pay | Admitting: Emergency Medicine

## 2023-05-12 ENCOUNTER — Emergency Department (HOSPITAL_COMMUNITY)
Admission: EM | Admit: 2023-05-12 | Discharge: 2023-05-12 | Disposition: A | Discharge status: Home / Self Care | Payer: MEDICAID | Attending: Emergency Medicine | Admitting: Emergency Medicine

## 2023-05-12 DIAGNOSIS — N12 Tubulo-interstitial nephritis, not specified as acute or chronic: Secondary | ICD-10-CM

## 2023-05-12 DIAGNOSIS — R112 Nausea with vomiting, unspecified: Secondary | ICD-10-CM | POA: Insufficient documentation

## 2023-05-12 DIAGNOSIS — R1084 Generalized abdominal pain: Secondary | ICD-10-CM | POA: Diagnosis not present

## 2023-05-12 DIAGNOSIS — R300 Dysuria: Secondary | ICD-10-CM | POA: Diagnosis present

## 2023-05-12 LAB — URINALYSIS, ROUTINE W REFLEX MICROSCOPIC
Bilirubin Urine: NEGATIVE
Glucose, UA: NEGATIVE mg/dL
Hgb urine dipstick: NEGATIVE
Ketones, ur: NEGATIVE mg/dL
Leukocytes,Ua: NEGATIVE
Nitrite: POSITIVE — AB
Protein, ur: NEGATIVE mg/dL
Specific Gravity, Urine: 1.015 (ref 1.005–1.030)
pH: 6 (ref 5.0–8.0)

## 2023-05-12 LAB — COMPREHENSIVE METABOLIC PANEL
ALT: 9 U/L (ref 0–44)
AST: 13 U/L — ABNORMAL LOW (ref 15–41)
Albumin: 4.6 g/dL (ref 3.5–5.0)
Alkaline Phosphatase: 35 U/L — ABNORMAL LOW (ref 38–126)
Anion gap: 6 (ref 5–15)
BUN: 18 mg/dL (ref 6–20)
CO2: 20 mmol/L — ABNORMAL LOW (ref 22–32)
Calcium: 8.9 mg/dL (ref 8.9–10.3)
Chloride: 106 mmol/L (ref 98–111)
Creatinine, Ser: 0.45 mg/dL (ref 0.44–1.00)
GFR, Estimated: >60 mL/min (ref >60)
Glucose, Bld: 93 mg/dL (ref 70–99)
Potassium: 3.5 mmol/L (ref 3.5–5.1)
Sodium: 132 mmol/L — ABNORMAL LOW (ref 135–145)
Total Bilirubin: 1.4 mg/dL — ABNORMAL HIGH (ref 0.0–1.2)
Total Protein: 7.9 g/dL (ref 6.5–8.1)

## 2023-05-12 LAB — CBC
HCT: 41 % (ref 36.0–46.0)
Hemoglobin: 13.5 g/dL (ref 12.0–15.0)
MCH: 26.7 pg (ref 26.0–34.0)
MCHC: 32.9 g/dL (ref 30.0–36.0)
MCV: 81.2 fL (ref 80.0–100.0)
Platelets: 188 10*3/uL (ref 150–400)
RBC: 5.05 MIL/uL (ref 3.87–5.11)
RDW: 13.1 % (ref 11.5–15.5)
WBC: 6.9 10*3/uL (ref 4.0–10.5)
nRBC: 0 % (ref 0.0–0.2)

## 2023-05-12 MED ORDER — OXYCODONE-ACETAMINOPHEN 5-325 MG PO TABS
1.0000 | ORAL_TABLET | Freq: Once | ORAL | Status: AC
  Administered 2023-05-12: 1 via ORAL
  Filled 2023-05-12: qty 1

## 2023-05-12 MED ORDER — FENTANYL CITRATE PF 50 MCG/ML IJ SOSY
50.0000 ug | PREFILLED_SYRINGE | Freq: Once | INTRAMUSCULAR | Status: AC
  Administered 2023-05-12: 50 ug via INTRAVENOUS
  Filled 2023-05-12: qty 1

## 2023-05-12 MED ORDER — SODIUM CHLORIDE 0.9 % IV BOLUS
500.0000 mL | Freq: Once | INTRAVENOUS | Status: AC
  Administered 2023-05-12: 500 mL via INTRAVENOUS

## 2023-05-12 MED ORDER — OXYCODONE-ACETAMINOPHEN 5-325 MG PO TABS
1.0000 | ORAL_TABLET | Freq: Four times a day (QID) | ORAL | 0 refills | Status: DC | PRN
Start: 2023-05-12 — End: 2024-01-11

## 2023-05-12 MED ORDER — SODIUM CHLORIDE 0.9 % IV SOLN
1.0000 g | Freq: Once | INTRAVENOUS | Status: AC
  Administered 2023-05-12: 1 g via INTRAVENOUS
  Filled 2023-05-12: qty 10

## 2023-05-12 MED ORDER — ONDANSETRON HCL 4 MG PO TABS: 4.0000 mg | ORAL_TABLET | Freq: Three times a day (TID) | ORAL | 0 refills | Status: AC | PRN

## 2023-05-12 MED ORDER — CEPHALEXIN 500 MG PO CAPS: 500.0000 mg | ORAL_CAPSULE | Freq: Two times a day (BID) | ORAL | 0 refills | Status: AC

## 2023-05-12 MED ORDER — ONDANSETRON HCL 4 MG/2ML IJ SOLN
4.0000 mg | Freq: Once | INTRAMUSCULAR | Status: AC
  Administered 2023-05-12: 4 mg via INTRAVENOUS
  Filled 2023-05-12: qty 2

## 2023-05-12 NOTE — ED Notes (Signed)
 Urine sent

## 2023-05-12 NOTE — Discharge Instructions (Addendum)
 Please take Keflex  (antibiotic) twice daily for 7 days to treat your UTI  You can use Zofran  every 8 hours as needed for nausea/vomiting  Please use Tylenol  regularly for pain. You can use Percocet as needed for severe pain.  Please seek medical attention for worsening symptoms including severe pain, fevers, blood in urine, chest pain or shortness of breath

## 2023-05-12 NOTE — ED Provider Notes (Addendum)
 [place] EMERGENCY DEPARTMENT AT [facility] Provider Note   CSN: [id number] Arrival date & time: 05/12/23  9149     History  No chief complaint on file.   Renee Suarez is a 33 y.o. female.  33 year old otherwise healthy female presenting for 3 days of dysuria and progressively worsening nonradiating right flank pain and nausea/vomiting. Hx appendectomy and cholecystectomy. Patient has history of UTIs initially this felt similar.  However, she does not typically have flank pain or nausea/vomiting.  Has not checked her temperature at home but does report having some hot flashes and chills associated with her symptoms.  She has not been able to keep down any solids or liquids since last night. Pt states she has not been sexually active in >14mo  Tried taking ibuprofen  last night with very temporary relief but then woke up again today with persistent symptoms so presented to the ED.  Also has some mild central abdominal pain due to vomiting          Home Medications Prior to Admission medications   Medication Sig Start Date End Date Taking? Authorizing Provider  ibuprofen  (ADVIL ) 200 MG tablet Take 800 mg by mouth every 6 (six) hours as needed for mild pain (pain score 1-3) or moderate pain (pain score 4-6).   Yes [provider]  nitrofurantoin , macrocrystal-monohydrate, (MACROBID ) 100 MG capsule Take 1 capsule (100 mg total) by mouth 2 (two) times daily. Patient not taking: Reported on 05/12/2023 04/19/23   Edwardo Marsa HERO, PA-C      Allergies    Iodine solution [povidone iodine]    Review of Systems   Review of Systems  Constitutional:  Positive for chills, diaphoresis and fatigue. Negative for fever.  Respiratory:  Negative for shortness of breath.   Cardiovascular:  Negative for chest pain.  Gastrointestinal:  Positive for abdominal pain, nausea and vomiting. Negative for blood in stool and constipation.  Genitourinary:  Positive for dysuria,  flank pain, frequency, hematuria and urgency.  Neurological:  Negative for syncope, weakness and headaches.    Physical Exam Updated Vital Signs BP 106/71 (BP Location: Left Arm)   Pulse 81   Temp 99.5 F (37.5 C) (Oral)   Resp 18   SpO2 100%  Physical Exam Constitutional:      General: She is not in acute distress. HENT:     Head: Normocephalic and atraumatic.  Cardiovascular:     Rate and Rhythm: Normal rate and regular rhythm.     Heart sounds: Normal heart sounds. No murmur heard. Pulmonary:     Effort: Pulmonary effort is normal. No respiratory distress.     Breath sounds: Normal breath sounds. No wheezing, rhonchi or rales.  Abdominal:     General: Abdomen is flat. There is no distension.     Palpations: Abdomen is soft.     Tenderness: There is abdominal tenderness. There is right CVA tenderness. There is no left CVA tenderness, guarding or rebound.     Comments: Mild diffuse TTP   Musculoskeletal:        General: No swelling or deformity.  Neurological:     General: No focal deficit present.     Mental Status: She is alert. Mental status is at baseline.  Psychiatric:        Mood and Affect: Mood normal.        Thought Content: Thought content normal.     ED Results / Procedures / Treatments   Labs (all labs ordered are  listed, but only abnormal results are displayed) Labs Reviewed  URINALYSIS, ROUTINE W REFLEX MICROSCOPIC - Abnormal; Notable for the following components:      Result Value   APPearance HAZY (*)    Nitrite POSITIVE (*)    Bacteria, UA RARE (*)    All other components within normal limits  COMPREHENSIVE METABOLIC PANEL - Abnormal; Notable for the following components:   Sodium 132 (*)    CO2 20 (*)    AST 13 (*)    Alkaline Phosphatase 35 (*)    Total Bilirubin 1.4 (*)    All other components within normal limits  CBC  PREGNANCY, URINE    EKG None  Radiology No results found.  Procedures Procedures    Medications Ordered in  ED Medications - No data to display  ED Course/ Medical Decision Making/ A&P                                 Medical Decision Making 33 year old female with history of appendectomy and cholecystectomy presenting for 3 days of dysuria as well as progressive right flank pain with nausea and vomiting.  Some subjective fever/chill.  Here she has afebrile and hemodynamically stable albeit slightly soft BP.  Right CVA tenderness on exam.  UA with positive nitrates.  Overall her picture is consistent with UTI and likely pyelonephritis.  No hematuria or radiating/groin pain to suggest nephrolithiasis. CBC and CMP are unremarkable.   Will give 500cc bolus, IV fentanyl  for pain, IV Zofran  for nausea, and IV Rocephin  x1. Anticipate discharge with PO antibiotics pending reevaluation   4:37 PM Symptoms improved, pt comfortable with discharge with oral antibiotics and nausea medication. Prescribed Keflex  BID x 7 days and PRN Zofran  and PRN percocet. Discussed return precautions  Amount and/or Complexity of Data Reviewed Labs: ordered.  Risk Prescription drug management.          Final Clinical Impression(s) / ED Diagnoses Final diagnoses:  None    Rx / DC Orders ED Discharge Orders     None         Romelle Booty, MD 05/12/23 1640    Romelle Booty, MD 05/12/23 1644    Tegeler, Lonni PARAS, MD 05/14/23 [phone]

## 2023-05-12 NOTE — ED Triage Notes (Signed)
 Pt here from home with c/o left upper back pain and some frequent urination along with some nausea  , ongoing for a couple of days

## 2023-05-14 LAB — URINE CULTURE: Culture: >=100000 — AB

## 2023-05-18 ENCOUNTER — Telehealth (HOSPITAL_BASED_OUTPATIENT_CLINIC_OR_DEPARTMENT_OTHER): Payer: Self-pay | Admitting: Emergency Medicine

## 2023-05-18 NOTE — Telephone Encounter (Signed)
Post ED Visit - Positive Culture Follow-up  Culture report reviewed by antimicrobial stewardship pharmacist: [facility] []  Enzo Bi, Pharm.D. []  Celedonio Miyamoto, Pharm.D., BCPS AQ-ID []  Garvin Fila, Pharm.D., BCPS [x]  Georgina Pillion, Pharm.D., BCPS []  [place], [address].D., BCPS, AAHIVP []  Estella Husk, Pharm.D., BCPS, AAHIVP []  Lysle Pearl, PharmD, BCPS []  Phillips Climes, PharmD, BCPS []  Agapito Games, PharmD, BCPS []  Verlan Friends, PharmD []  Mervyn Gay, PharmD, BCPS []  Vinnie Level, PharmD  [facility] []  Len Childs, PharmD []  Greer Pickerel, PharmD []  Adalberto Cole, PharmD []  Perlie Gold, Rph []  Lonell Face) Jean Rosenthal, PharmD []  Earl Many, PharmD []  Junita Push, PharmD []  Dorna Leitz, PharmD []  Terrilee Files, PharmD []  Lynann Beaver, PharmD []  Keturah Barre, PharmD []  Loralee Pacas, PharmD []  Bernadene Person, PharmD   Positive urine culture Treated with cephalexin, organism sensitive to the same and no further patient follow-up is required at this time.  Renee Suarez [name] 05/18/2023, 6:10 AM

## 2024-01-11 ENCOUNTER — Other Ambulatory Visit: Payer: Self-pay

## 2024-01-11 ENCOUNTER — Emergency Department (HOSPITAL_COMMUNITY)
Admission: EM | Admit: 2024-01-11 | Discharge: 2024-01-11 | Disposition: A | Discharge status: Home / Self Care | Payer: MEDICAID

## 2024-01-11 DIAGNOSIS — X030XXA Exposure to flames in controlled fire, not in building or structure, initial encounter: Secondary | ICD-10-CM | POA: Diagnosis not present

## 2024-01-11 DIAGNOSIS — I10 Essential (primary) hypertension: Secondary | ICD-10-CM | POA: Insufficient documentation

## 2024-01-11 DIAGNOSIS — T22111A Burn of first degree of right forearm, initial encounter: Secondary | ICD-10-CM | POA: Insufficient documentation

## 2024-01-11 DIAGNOSIS — T22011A Burn of unspecified degree of right forearm, initial encounter: Secondary | ICD-10-CM | POA: Diagnosis present

## 2024-01-11 MED ORDER — OXYCODONE-ACETAMINOPHEN 5-325 MG PO TABS
1.0000 | ORAL_TABLET | Freq: Once | ORAL | Status: AC
  Administered 2024-01-11: 1 via ORAL
  Filled 2024-01-11: qty 1

## 2024-01-11 MED ORDER — LIDOCAINE HCL URETHRAL/MUCOSAL 2 % EX GEL
1.0000 | Freq: Once | CUTANEOUS | Status: AC
  Administered 2024-01-11: 1 via TOPICAL
  Filled 2024-01-11: qty 11

## 2024-01-11 MED ORDER — OXYCODONE-ACETAMINOPHEN 5-325 MG PO TABS
1.0000 | ORAL_TABLET | Freq: Four times a day (QID) | ORAL | 0 refills | Status: AC | PRN
Start: 2024-01-11 — End: 2024-01-14

## 2024-01-11 MED ORDER — OXYCODONE-ACETAMINOPHEN 5-325 MG PO TABS
1.0000 | ORAL_TABLET | Freq: Four times a day (QID) | ORAL | Status: DC | PRN
  Administered 2024-01-11 (×2): 1 via ORAL
  Filled 2024-01-11 (×2): qty 1

## 2024-01-11 MED ORDER — KETOROLAC TROMETHAMINE 15 MG/ML IJ SOLN
15.0000 mg | Freq: Once | INTRAMUSCULAR | Status: AC
  Administered 2024-01-11: 15 mg via INTRAMUSCULAR
  Filled 2024-01-11: qty 1
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
## 2024-01-11 NOTE — ED Provider Notes (Signed)
 [place] EMER[facility]ENC[facility] DEPARTMENT AT [facility] Provider Note HPI Emalene Welte is a 33 y.o. female with a medical history as below who presents ***   Past Medical History:  Diagnosis Date  . Anemia   . Eclampsia    with 1st preg  . GERD (gastroesophageal reflux disease)    post partum 1st preg.  SABRA Headache   . Hypertension   . Pregnancy induced hypertension   . Preterm delivery   . Preterm labor   . S/P appendectomy 11/30/2016  . Sickle cell trait [facility])    Past Surgical History:  Procedure Laterality Date  . APPENDECTOMY    . CESAREAN SECTION    . CESAREAN SECTION  04/25/2011   Procedure: CESAREAN SECTION;  Surgeon: Gloris DELENA Hugger, MD;  Location: [facility] ORS;  Service: Gynecology;  Laterality: N/A;  Repeat C/Section  . CESAREAN SECTION WITH BILATERAL TUBAL LIGATION Bilateral 08/06/2016   Procedure: CESAREAN SECTION WITH BILATERAL TUBAL LIGATION;  Surgeon: Glenys GORMAN Birk, MD;  Location: [facility] BIRTHING SUIT[facility];  Service: Obstetrics;  Laterality: Bilateral;  . CHOLECYSTECTOMY    . CHOLECYSTECTOMY  2010  . LAPAROSCOPIC APPENDECTOMY N/A 11/30/2016   Procedure: APPENDECTOMY LAPAROSCOPIC;  Surgeon: Kinsinger, Herlene Righter, MD;  Location: [facility] ORS;  Service: General;  Laterality: N/A;    Review of Systems Pertinent positives and negative findings are listed as part of the History of Present Illness and MDM  Physical Exam Vitals:   01/11/24 1658 01/11/24 1703 01/11/24 1704  BP:   124/85  Pulse:   82  Resp:   18  Temp:   98.4 F (36.9 C)  SpO2:   100%  Weight: 52.2 kg 54.4 kg   Height: 5' (1.524 m)       Constitutional Nursing notes reviewed Vital signs reviewed  HEENT No obvious trauma No singed hair on face, head, or nares No obvious trauma or burns to oropharynx Uvula midline Pupils round, equal, and reactive to light. Pupils cross midline Neck supple  Respiratory Effort normal Breathing well on room air CTAB  CV Normal rate and rhythm   SKIN  Mild discoloration of  the right arm when compared to the left without skin breakdown, erythema, blistering   Neuro Awake and alert Pupils cross midline Moving all extremities    MDM:  Initial Differential Diagnoses includes ***  I reviewed the patient's vitals, the nursing triage note and evaluated the patient at bedside.   I reviewed the patient's external records which show ***  I considered ACS and reviewed/interpreted the EKG. EKG shows normal sinus rhythm with no evidence of acute ischemic changes or high grade conduction block. There is no STEMI or contiguous T wave inversions. No evidence of new-onset arrythmia. The PR, QRS, and QT intervals are normal.   The patient was risk stratified with a HEAR score of ***. Troponin  *** --> ***. This significantly lowers my suspicion for ACS.  Procedures: Procedures  Medications administered in the ED: Medications  oxyCODONE -acetaminophen  (PERCOCET/ROXICET) 5-325 MG per tablet 1 tablet (1 tablet Oral Given 01/11/24 1823)  oxyCODONE -acetaminophen  (PERCOCET/ROXICET) 5-325 MG per tablet 1 tablet (1 tablet Oral Given 01/11/24 1723)  lidocaine  (XYLOCAINE ) 2 % jelly 1 Application (1 Application Topical Given 01/11/24 1824)     Impression: No diagnosis found.   Patient's presentation is most consistent with {EM COPA:27473}  Disposition: ED Disposition:  Data Unavailable   ***Discharge: Patient is felt to be medically appropriate for discharge at this time. Patient was instructed to follow up  with their primary care doctor/specialists listed above for re-evaluation. Patient was given strict return precautions.  ED Discharge Orders     None
